# Patient Record
Sex: Male | Born: 1940
Health system: Southern US, Community
[De-identification: ages and names within clinical notes are randomized; demographics above are authoritative.]

## PROBLEM LIST (undated history)

## (undated) DIAGNOSIS — I10 Essential (primary) hypertension: Secondary | ICD-10-CM

## (undated) DIAGNOSIS — I4891 Unspecified atrial fibrillation: Secondary | ICD-10-CM

## (undated) DIAGNOSIS — I509 Heart failure, unspecified: Secondary | ICD-10-CM

## (undated) DIAGNOSIS — E785 Hyperlipidemia, unspecified: Secondary | ICD-10-CM

## (undated) DIAGNOSIS — N183 Chronic kidney disease, stage 3 unspecified: Secondary | ICD-10-CM

## (undated) DIAGNOSIS — I428 Other cardiomyopathies: Secondary | ICD-10-CM

## (undated) DIAGNOSIS — E119 Type 2 diabetes mellitus without complications: Secondary | ICD-10-CM

## (undated) DIAGNOSIS — N401 Enlarged prostate with lower urinary tract symptoms: Secondary | ICD-10-CM

## (undated) DIAGNOSIS — N138 Other obstructive and reflux uropathy: Secondary | ICD-10-CM

## (undated) DIAGNOSIS — E7800 Pure hypercholesterolemia, unspecified: Secondary | ICD-10-CM

## (undated) DIAGNOSIS — Z87442 Personal history of urinary calculi: Secondary | ICD-10-CM

## (undated) HISTORY — PX: CATARACT EXTRACTION W/ INTRAOCULAR LENS IMPLANT: SHX1309

## (undated) HISTORY — PX: HERNIA REPAIR: SHX51

## (undated) HISTORY — PX: CHOLECYSTECTOMY: SHX55

---

## 1998-07-16 ENCOUNTER — Ambulatory Visit (HOSPITAL_BASED_OUTPATIENT_CLINIC_OR_DEPARTMENT_OTHER): Admission: RE | Admit: 1998-07-16 | Discharge: 1998-07-16 | Payer: Self-pay | Admitting: Orthopedic Surgery

## 2003-10-05 ENCOUNTER — Emergency Department (HOSPITAL_COMMUNITY): Admission: EM | Admit: 2003-10-05 | Discharge: 2003-10-05 | Payer: Self-pay | Admitting: Emergency Medicine

## 2005-05-17 ENCOUNTER — Emergency Department (HOSPITAL_COMMUNITY): Admission: EM | Admit: 2005-05-17 | Discharge: 2005-05-17 | Payer: Self-pay | Admitting: Emergency Medicine

## 2005-08-20 ENCOUNTER — Ambulatory Visit (HOSPITAL_COMMUNITY): Admission: RE | Admit: 2005-08-20 | Discharge: 2005-08-20 | Payer: Self-pay | Admitting: Urology

## 2005-08-20 ENCOUNTER — Ambulatory Visit: Payer: Self-pay | Admitting: Internal Medicine

## 2005-10-26 ENCOUNTER — Ambulatory Visit (HOSPITAL_COMMUNITY): Admission: RE | Admit: 2005-10-26 | Discharge: 2005-10-26 | Payer: Self-pay | Admitting: General Surgery

## 2009-08-26 ENCOUNTER — Ambulatory Visit (HOSPITAL_COMMUNITY): Admission: RE | Admit: 2009-08-26 | Discharge: 2009-08-26 | Payer: Self-pay | Admitting: Internal Medicine

## 2010-08-27 ENCOUNTER — Encounter: Payer: Self-pay | Admitting: Internal Medicine

## 2010-09-02 NOTE — Letter (Signed)
Summary: TRIAGE ORDER  TRIAGE ORDER   Imported By: Sofie Rower 08/27/2010 13:37:18  _____________________________________________________________________  External Attachment:    Type:   Image     Comment:   External Document

## 2010-09-04 ENCOUNTER — Ambulatory Visit (HOSPITAL_COMMUNITY)
Admission: RE | Admit: 2010-09-04 | Discharge: 2010-09-04 | Disposition: A | Discharge status: Home / Self Care | Payer: Medicare Other | Source: Ambulatory Visit | Attending: Internal Medicine | Admitting: Internal Medicine

## 2010-09-04 ENCOUNTER — Other Ambulatory Visit: Payer: Self-pay | Admitting: Internal Medicine

## 2010-09-04 ENCOUNTER — Encounter: Payer: Medicare Other | Admitting: Internal Medicine

## 2010-09-04 DIAGNOSIS — Z09 Encounter for follow-up examination after completed treatment for conditions other than malignant neoplasm: Secondary | ICD-10-CM

## 2010-09-04 DIAGNOSIS — Z79899 Other long term (current) drug therapy: Secondary | ICD-10-CM | POA: Insufficient documentation

## 2010-09-04 DIAGNOSIS — Z860100 Personal history of colon polyps, unspecified: Secondary | ICD-10-CM | POA: Insufficient documentation

## 2010-09-04 DIAGNOSIS — D126 Benign neoplasm of colon, unspecified: Secondary | ICD-10-CM | POA: Insufficient documentation

## 2010-09-04 DIAGNOSIS — I10 Essential (primary) hypertension: Secondary | ICD-10-CM | POA: Insufficient documentation

## 2010-09-04 DIAGNOSIS — K5730 Diverticulosis of large intestine without perforation or abscess without bleeding: Secondary | ICD-10-CM

## 2010-09-04 HISTORY — PX: COLONOSCOPY: SHX174

## 2010-09-09 NOTE — Op Note (Signed)
  NAME:  JAGAN, VODA                ACCOUNT NO.:  [id number]  MEDICAL RECORD NO.:  [id number]           PATIENT TYPE:  O  LOCATION:  DAYP                          FACILITY:  [facility]  PHYSICIAN:  R. Garfield Cornea, M.D. DATE OF BIRTH:  [birth year]  DATE OF PROCEDURE:  09/04/2010 DATE OF DISCHARGE:                              OPERATIVE REPORT   COLONOSCOPY AND BIOPSY  INDICATIONS FOR PROCEDURE:  The patient is a pleasant 70 year old gentleman with history of colonic polyps.  Last colonoscopy is in March 2007.  At that time, he was found have pan-colonic diverticula only.  He has no lower GI tract symptoms currently.  Colonoscopy is now being done as surveillance maneuver.  Risks, benefits, limitations, alternatives, imponderables have been discussed, questions answered.  Please see documentation in the medical record.  PROCEDURE NOTE:  O2 saturation, blood pressure, pulse, and respirations were monitored throughout the entirety of procedure.  CONSCIOUS SEDATION:  Versed 3 mg IV, Demerol 75 mg IV in divided doses.  INSTRUMENT:  Pentax video chip system.  FINDINGS:  Examination of the digital rectal exam revealed no abnormalities.  Endoscopic findings:  Prep was adequate.  Colon: Colonic mucosa was surveyed from the rectosigmoid junction through the left transverse right colon to the appendiceal orifice, ileocecal valve/cecum.  These structures were well seen and photographed for the record.  From this level, scope was slowly and cautiously withdrawn. All previously mentioned mucosal surfaces were again seen.  The patient was noted to have single diminutive polyp at the base of the cecum which was cold biopsied/removed and also pan-colonic diverticula and remainder colon mucosa appeared normal.  Scope was pulled down the rectum where a thorough examination of the rectal mucosa including retroflex view of the anal verge demonstrated no abnormalities.  The patient tolerated  the procedure well.  Cecal withdrawal time 11 minutes.  IMPRESSION: 1. Normal rectum. 2. Pan colonic diverticula. 3. Cecal polyp status post cold biopsy removal.  RECOMMENDATIONS: 1. Diverticulosis polyp literature provided to Mr. Lauderman. 2. Follow up on path. 3. Further recommendations to follow.     Bridgette Habermann, M.D.     RMR/MEDQ  D:  09/04/2010  T:  09/04/2010  Job:  [id number]  cc:   Paula Compton. Willey Blade, MD Fax: [phone]  Electronically Signed by Jannette Spanner M.D. on 09/09/2010 11:23:53 AM

## 2010-10-31 NOTE — Op Note (Signed)
Chris Guerrero, Chris Guerrero                ACCOUNT NO.:  [id number]   MEDICAL RECORD NO.:  [id number]          PATIENT TYPE:  AMB   LOCATION:  DAY                           FACILITY:  [facility]   PHYSICIAN:  Jamesetta So, M.D.  DATE OF BIRTH:  June 21, 1940   DATE OF PROCEDURE:  10/26/2005  DATE OF DISCHARGE:                                 OPERATIVE REPORT   PREOPERATIVE DIAGNOSIS:  Incisional hernia.   POSTOPERATIVE DIAGNOSIS:  Incisional hernia.   PROCEDURE:  Incisional herniorrhaphy with mesh.   SURGEON:  Dr. Aviva Signs   ANESTHESIA:  General endotracheal.   INDICATIONS:  The patient is a 70 year old white male status post abdominal  surgery in the remote past who presents with an umbilical and periumbilical  hernia.  This is incisional in nature.  The patient now comes to the  operating room for an incisional herniorrhaphy with mesh.  The risks and  benefits of the procedure including bleeding, infection, and recurrence of  the hernia were fully explained to the patient, who gave informed consent.   PROCEDURE NOTE:  The patient was placed in supine position.  After induction  of general endotracheal anesthesia, the abdomen was prepped and draped using  the usual sterile technique with Betadine.  Surgical site confirmation was  performed.   An incision was made through the previous midline incision around the  umbilicus.  This was taken down to the fascia.  The patient was noted to  have small metal sutures in place which were removed.  The hernia involved  the umbilicus and the incision.  The hernia defect was found and omentum was  present.  This was reduced without difficulty.  The defect measured  approximately 3-4 cm its greatest diameter.  A medium-size polypropylene  mesh plug was then placed into this region and secured circumferentially to  the fascia using 2-0 Novofil interrupted sutures.  The fascia was then  reapproximated longitudinally using 0 Prolene interrupted  sutures.  The base  of the umbilicus was secured back to the fascia using a 2-0 Vicryl  interrupted suture.  The subcutaneous layer was reapproximated using a 2-0  Vicryl interrupted suture.  The skin was closed using staples.  0.5%  Sensorcaine was instilled in the surrounding wound.  Betadine ointment and  dry sterile dressing were applied.   All tape and needle counts were correct at the end of the procedure.  The  patient was extubated in the operating room and went back to recovery room  awake in stable condition.   COMPLICATIONS:  None.   SPECIMEN:  None.   BLOOD LOSS:  Minimal.      Jamesetta So, M.D.  Electronically Signed     MAJ/MEDQ  D:  10/26/2005  T:  10/26/2005  Job:  [id number]   cc:   Paula Compton. Willey Blade, MD  Fax: [phone]

## 2010-10-31 NOTE — H&P (Signed)
Chris Guerrero, Chris Guerrero                ACCOUNT NO.:  [id number]   MEDICAL RECORD NO.:  [id number]          PATIENT TYPE:  AMB   LOCATION:                                FACILITY:  [facility]   PHYSICIAN:  Jamesetta So, M.D.  DATE OF BIRTH:  01/24/1941   DATE OF ADMISSION:  10/26/2005  DATE OF DISCHARGE:  LH                                HISTORY & PHYSICAL   CHIEF COMPLAINT:  Incisional hernia.   HISTORY OF PRESENT ILLNESS:  The patient is a 70 year old white male who was  referred for evaluation and treatment of incisional hernia.  It has been  present for some time but recently has increased in size and is causing him  discomfort.  It is in the periumbilical region.  No nausea or vomiting have  been noted.   PAST MEDICAL HISTORY:  1.  Hypertension.  2.  Indigestion.   PAST SURGICAL HISTORY:  1.  Abdominal surgery.  2.  Thumb surgery.   CURRENT MEDICATIONS:  1.  Doxazosin 4 mg p.o. daily.  2.  Lisinopril 40 mg p.o. daily.  3.  Ranitidine 150 mg p.o. daily.  4.  Trazodone 50 mg p.o. daily.  5.  Gemfibrozil 600 mg p.o. daily.   ALLERGIES:  No known drug allergies.   REVIEW OF SYSTEMS:  Noncontributory.   PHYSICAL EXAMINATION:  GENERAL:  The patient is a well-developed, well-  nourished white male in no acute distress.  LUNGS:  Clear to auscultation with equal breath sounds bilaterally.  CARDIAC:  Regular rate and rhythm without S3, S4 or murmurs.  ABDOMEN:  Soft, nontender, nondistended.  An umbilical and periumbilical  hernia is present adjacent to a surgical scar.  It is reducible.   IMPRESSION:  Incisional hernia.   PLAN:  The patient is scheduled for an incisional herniorrhaphy with  possible mesh on Oct 26, 2005.  The risks and benefits of the procedure  including bleeding, infection, recurrence of the hernia, were fully  explained to the patient, who gave informed consent.      Jamesetta So, M.D.  Electronically Signed     MAJ/MEDQ  D:  10/16/2005  T:   10/16/2005  Job:  [id number]   cc:   [facility]  Fax: [place] Willey Blade, MD  Fax: [phone]

## 2010-10-31 NOTE — Op Note (Signed)
NAME:  Chris Guerrero, Chris Guerrero                ACCOUNT NO.:  [id number]   MEDICAL RECORD NO.:  [id number]          PATIENT TYPE:  AMB   LOCATION:  DAY                           FACILITY:  [facility]   PHYSICIAN:  R. Garfield Cornea, M.D. DATE OF BIRTH:  Jun 08, 1941   DATE OF PROCEDURE:  08/20/2005  DATE OF DISCHARGE:                                 OPERATIVE REPORT   PROCEDURE:  Surveillance colonoscopy.   INDICATIONS FOR PROCEDURE:  The patient is a 70 year old gentleman with a  history of colonic adenomas removed back in the 1990s. His last colonoscopy  was in 2002 which showed only diverticulosis. He is devoid of any lower GI  tract symptoms. He is here for surveillance colonoscopy. This approach has  been discussed with the patient at length. Potential risks, benefits, and  alternatives have been reviewed and questions answered. He is agreeable.  Please see documentation in the medical record.   PROCEDURE NOTE:  O2 saturation, blood pressure, pulse, and respirations were  monitored throughout the entire procedure. Conscious sedation with Versed 3  mg IV and Demerol 50 mg IV in divided doses.   INSTRUMENT:  Olympus video chip system.   FINDINGS:  Digital rectal exam revealed no abnormalities.   ENDOSCOPIC FINDINGS:  Prep was excellent.   Rectum:  Examination of the rectal mucosa including retroflexed view of the  anal verge revealed no abnormalities.   Colon:  Colonic mucosa was surveyed from the rectosigmoid junction through  the left, transverse, and right colon to the area of the appendiceal  orifice, ileocecal valve, and cecum. These structures were well seen and  photographed for the record. From this level, the scope was slowly  withdrawn, and all previously mentioned mucosal surfaces were again seen.  The patient had a few scattered pan colonic diverticula. The remainder of  the colonic mucosa appeared normal. The patient tolerated the procedure well  and was reactive to endoscopy.   IMPRESSION:  1.  Normal rectum.  2.  Few scattered pan colonic diverticula. The remainder of the colonic      mucosa appeared normal.   RECOMMENDATIONS:  1.  Diverticulosis literature provided to Mr. Jaggers.  2.  Repeat surveillance colonoscopy in five years.      Bridgette Habermann, M.D.  Electronically Signed     RMR/MEDQ  D:  08/20/2005  T:  08/21/2005  Job:  [id number]   cc:   Paula Compton. Willey Blade, MD  Fax: [phone]

## 2011-08-12 ENCOUNTER — Encounter (HOSPITAL_COMMUNITY): Payer: Self-pay | Admitting: *Deleted

## 2011-08-12 ENCOUNTER — Emergency Department (HOSPITAL_COMMUNITY)
Admission: EM | Admit: 2011-08-12 | Discharge: 2011-08-13 | Disposition: A | Discharge status: Home / Self Care | Payer: Medicare Other | Attending: Emergency Medicine | Admitting: Emergency Medicine

## 2011-08-12 DIAGNOSIS — I10 Essential (primary) hypertension: Secondary | ICD-10-CM | POA: Insufficient documentation

## 2011-08-12 DIAGNOSIS — R339 Retention of urine, unspecified: Secondary | ICD-10-CM

## 2011-08-12 DIAGNOSIS — Z79899 Other long term (current) drug therapy: Secondary | ICD-10-CM | POA: Insufficient documentation

## 2011-08-12 DIAGNOSIS — R300 Dysuria: Secondary | ICD-10-CM | POA: Insufficient documentation

## 2011-08-12 DIAGNOSIS — E785 Hyperlipidemia, unspecified: Secondary | ICD-10-CM | POA: Insufficient documentation

## 2011-08-12 HISTORY — DX: Hyperlipidemia, unspecified: E78.5

## 2011-08-12 HISTORY — DX: Essential (primary) hypertension: I10

## 2011-08-12 NOTE — ED Notes (Signed)
Pt reports he has been unable to urinate for the past three hours

## 2011-08-13 LAB — URINE CULTURE: Culture  Setup Time: 201302280045

## 2011-08-13 LAB — URINALYSIS, MICROSCOPIC ONLY
Glucose, UA: NEGATIVE mg/dL
Ketones, ur: NEGATIVE mg/dL
Specific Gravity, Urine: <1.005 — ABNORMAL LOW (ref 1.005–1.030)
pH: 5.5 (ref 5.0–8.0)

## 2011-08-13 NOTE — ED Notes (Signed)
Leg bag placed on right leg. Patient tolerated well. Denies any needs. Getting dressed at this time.

## 2011-08-13 NOTE — ED Provider Notes (Signed)
History     CSN: [id number]  Arrival date & time 08/12/11  2334   First MD Initiated Contact with Patient 08/13/11 0002      Chief Complaint  Patient presents with  . Dysuria     The history is provided by the patient.  the patient reports approximately 3 and half hours of feeling like he needs a urinate but unable to.  Reports lower abdominal pressure.  He denies nausea and vomiting.  He denies fevers and chills.  He was otherwise in normal health prior to this.  He has had an episode of urinary retention before in the past.  He recently saw the urologist for followup and reports he had several "tests".  He denies dysuria and urinary frequency.  He denies flank pain.  He is otherwise without complaints.  But is on my valuation the patient had had a Foley catheter placed already and he drained out approximately 1000 cc of urine and reported that he felt much better at this time.  He reports he is ready to be discharged home.  Nothing worsens the symptoms.  Nothing improves his symptoms.  His symptoms were constant and are now resolved  Past Medical History  Diagnosis Date  . Hypertension   . Hyperlipemia     Past Surgical History  Procedure Date  . Cholecystectomy   . Hernia repair     No family history on file.  History  Substance Use Topics  . Smoking status: Never Smoker   . Smokeless tobacco: Not on file  . Alcohol Use: Yes      Review of Systems  Genitourinary: Positive for dysuria.  All other systems reviewed and are negative.    Allergies  Review of patient's allergies indicates no known allergies.  Home Medications   Current Outpatient Rx  Name Route Sig Dispense Refill  . DOXAZOSIN MESYLATE 4 MG PO TABS Oral Take 4 mg by mouth at bedtime.    [name] GEMFIBROZIL 600 MG PO TABS Oral Take 600 mg by mouth 2 (two) times daily before a meal.    . LISINOPRIL 40 MG PO TABS Oral Take 40 mg by mouth daily.    [name] RANITIDINE HCL 150 MG PO CAPS Oral Take 150 mg by mouth  daily.    . TESTOSTERONE CYPIONATE 100 MG/ML IM OIL Intramuscular Inject 200 mg into the muscle every 28 (twenty-eight) days. For IM use only    . TRAZODONE HCL 50 MG PO TABS Oral Take 50 mg by mouth at bedtime.      BP 145/83  Pulse 114  Temp(Src) 97.6 F (36.4 C) (Oral)  Resp 20  Ht 5\' 10"  (1.778 m)  Wt 200 lb (90.719 kg)  BMI 28.70 kg/m2  SpO2 96%  Physical Exam  Nursing note and vitals reviewed. Constitutional: He is oriented to person, place, and time. He appears well-developed and well-nourished.  HENT:  Head: Normocephalic and atraumatic.  Eyes: EOM are normal.  Neck: Normal range of motion.  Cardiovascular: Normal rate.   Pulmonary/Chest: Effort normal.  Abdominal: Soft. He exhibits no distension. There is no tenderness.  Genitourinary: Penis normal.  Musculoskeletal: Normal range of motion.  Neurological: He is alert and oriented to person, place, and time.  Skin: Skin is warm and dry.  Psychiatric: He has a normal mood and affect. Judgment normal.    ED Course  Procedures (including critical care time)  Labs Reviewed  URINALYSIS, WITH MICROSCOPIC - Abnormal; Notable for the following:  Color, Urine STRAW (*)    Specific Gravity, Urine <1.005 (*)    Hgb urine dipstick MODERATE (*)    All other components within normal limits  URINE CULTURE   No results found.   1. Urinary retention       MDM  Acute urinary retention resolved with Foley catheter.  Home with Foley catheter.  Urology followup.  Urinalysis is without significant abnormality.  Urine culture sent        Hoy Morn, MD 08/13/11 0100

## 2011-08-13 NOTE — ED Notes (Signed)
MD at bedside to evaluate.

## 2011-08-13 NOTE — ED Notes (Signed)
MD at bedside to discuss plan of care

## 2011-08-13 NOTE — ED Notes (Signed)
Into room to see patient. Resting sitting up in bed. 1000 cc urine in foley bag. States he is feeling a lot better and going home. Call bell within reach. In no distress. Denies needs.

## 2011-08-13 NOTE — Discharge Instructions (Signed)
Acute Urinary Retention, Male You have been seen by a caregiver today because of your inability to urinate (pass your water). This is a common problem in elderly males. As men age their prostates become larger and block the flow of urine from the bladder. This is usually a problem that has come on gradually. It is often first noticed by having to get up at night to urinate. This is because as the prostate enlarges it is more difficult to empty the bladder completely. Treatment may involve a one time catheterization to empty the bladder. This is putting in a tube to drain your urine. Then you and your personal caregiver can decide at your earliest convenience how to handle this problem in the future. It may also be a problem that may not recur for years. Sometimes this problem can be caused by medications. In this case, all that is often necessary is to discontinue the offending agent. If you are to leave the foley catheter (a long, narrow, hollow tube) in and go home with a drainage system, you will need to discuss the best course of action with your caregiver. While the catheter is in, maintain a good intake of fluids. Keep the drainage bag emptied and lower than your catheter. This is so contaminated (infected) urine will not be flowing back into your bladder. This could lead to a urinary tract infection. Only take over-the-counter or prescription medicines for pain, discomfort, or fever as directed by your caregiver.  SEEK IMMEDIATE MEDICAL CARE IF:  You develop chills, fever, or show signs of generalized illness that occurs prior to seeing your caregiver. Document Released: 09/07/2000 Document Revised: 02/11/2011 Document Reviewed: 05/23/2008 [facility] Patient Information 2012 [place].

## 2011-08-13 NOTE — ED Notes (Signed)
Into room to assess patient. States he has not been able to pass urine for the past 3 hours. Noted swelling in bladder area. Tender on palpation. Denies abdominal pain. Call bell within reach. Denies needs.

## 2012-07-14 ENCOUNTER — Ambulatory Visit (INDEPENDENT_AMBULATORY_CARE_PROVIDER_SITE_OTHER): Payer: Medicare Other | Admitting: Otolaryngology

## 2013-05-21 ENCOUNTER — Encounter (HOSPITAL_COMMUNITY): Payer: Self-pay | Admitting: Emergency Medicine

## 2013-05-21 ENCOUNTER — Emergency Department (HOSPITAL_COMMUNITY)
Admission: EM | Admit: 2013-05-21 | Discharge: 2013-05-21 | Disposition: A | Discharge status: Home / Self Care | Payer: Medicare Other | Attending: Emergency Medicine | Admitting: Emergency Medicine

## 2013-05-21 DIAGNOSIS — E785 Hyperlipidemia, unspecified: Secondary | ICD-10-CM | POA: Insufficient documentation

## 2013-05-21 DIAGNOSIS — M549 Dorsalgia, unspecified: Secondary | ICD-10-CM | POA: Insufficient documentation

## 2013-05-21 DIAGNOSIS — R300 Dysuria: Secondary | ICD-10-CM | POA: Insufficient documentation

## 2013-05-21 DIAGNOSIS — Z79899 Other long term (current) drug therapy: Secondary | ICD-10-CM | POA: Insufficient documentation

## 2013-05-21 DIAGNOSIS — R109 Unspecified abdominal pain: Secondary | ICD-10-CM | POA: Insufficient documentation

## 2013-05-21 DIAGNOSIS — R339 Retention of urine, unspecified: Secondary | ICD-10-CM | POA: Insufficient documentation

## 2013-05-21 DIAGNOSIS — I10 Essential (primary) hypertension: Secondary | ICD-10-CM | POA: Insufficient documentation

## 2013-05-21 LAB — URINALYSIS, ROUTINE W REFLEX MICROSCOPIC
Bilirubin Urine: NEGATIVE
Ketones, ur: NEGATIVE mg/dL
Leukocytes, UA: NEGATIVE
Specific Gravity, Urine: <1.005 — ABNORMAL LOW (ref 1.005–1.030)
Urobilinogen, UA: 0.2 mg/dL (ref 0.0–1.0)

## 2013-05-21 LAB — URINE MICROSCOPIC-ADD ON

## 2013-05-21 NOTE — ED Notes (Signed)
Pink tinge urine noted in drainage tubing - advised patient to drink fluids to assist with flushing urine from bladder - should clear, if any concerns, return or call his MD.

## 2013-05-21 NOTE — ED Notes (Signed)
Catheter has drained ~ 1000 + ml   Into foley bag.  Patient is feeling much relief

## 2013-05-21 NOTE — ED Notes (Signed)
Onset today of increasing difficulty trying to void- now uncomfortable.  Has experienced similar difficulty in the past.

## 2013-05-21 NOTE — ED Provider Notes (Signed)
CSN: [id number]     Arrival date & time 05/21/13  2007 History  This chart was scribed for Lebron Quam, MD by Eston Mould, ED Scribe. This patient was seen in room APA01/APA01 and the patient's care was started at 8:35 PM.   Chief Complaint  Patient presents with  . Urinary Retention  . Back Pain   The history is provided by the patient. No language interpreter was used.   HPI Comments: Chris Guerrero is a 72 y.o. male who presents to the Emergency Department complaining of urinary retention and back pain that began about 6 hours ago . Pt states he is feeling lower abd pain. He states he has had previous urinary retention that lasted 1 week. He states he had a catheter placed to remove the urine. He states he was seen with a urologist that removed his catheter. Pt denies taking any new medications. Pt denies having any surgeries. Pt denies n/v/d.  Past Medical History  Diagnosis Date  . Hypertension   . Hyperlipemia    Past Surgical History  Procedure Laterality Date  . Cholecystectomy    . Hernia repair     History reviewed. No pertinent family history. History  Substance Use Topics  . Smoking status: Never Smoker   . Smokeless tobacco: Not on file  . Alcohol Use: Yes    Review of Systems  Constitutional: Negative for fever, chills, diaphoresis, appetite change and fatigue.  HENT: Negative for sore throat and trouble swallowing.   Eyes: Negative for visual disturbance.  Respiratory: Negative for cough, chest tightness, shortness of breath and wheezing.   Cardiovascular: Negative for chest pain.  Gastrointestinal: Negative for nausea, vomiting, abdominal pain, diarrhea and abdominal distention.  Endocrine: Negative for polydipsia, polyphagia and polyuria.  Genitourinary: Positive for difficulty urinating.  Musculoskeletal: Positive for back pain. Negative for gait problem.  Skin: Negative for color change, pallor and rash.  Neurological: Negative for dizziness,  syncope, light-headedness and headaches.    Allergies  Review of patient's allergies indicates no known allergies.  Home Medications   Current Outpatient Rx  Name  Route  Sig  Dispense  Refill  . doxazosin (CARDURA) 4 MG tablet   Oral   Take 4 mg by mouth at bedtime.         [name] gemfibrozil (LOPID) 600 MG tablet   Oral   Take 600 mg by mouth 2 (two) times daily before a meal.         . lisinopril (PRINIVIL,ZESTRIL) 40 MG tablet   Oral   Take 40 mg by mouth daily.         . ranitidine (ZANTAC) 150 MG capsule   Oral   Take 150 mg by mouth daily.         [name] testosterone cypionate (DEPOTESTOTERONE CYPIONATE) 100 MG/ML injection   Intramuscular   Inject 200 mg into the muscle every 28 (twenty-eight) days. For IM use only         . traZODone (DESYREL) 50 MG tablet   Oral   Take 50 mg by mouth at bedtime.          Triage Vitals:BP 133/75  Pulse 91  Temp(Src) 98.1 F (36.7 C)  Resp 20  Ht 5\' 9"  (1.753 m)  Wt 208 lb (94.348 kg)  BMI 30.70 kg/m2  SpO2 94%  Physical Exam  Nursing note and vitals reviewed. Constitutional: He is oriented to person, place, and time. He appears well-developed and well-nourished.  HENT:  Head:  Normocephalic.  Right Ear: External ear normal.  Left Ear: External ear normal.  Nose: Nose normal.  Mouth/Throat: Oropharynx is clear and moist.  Eyes: EOM are normal. Pupils are equal, round, and reactive to light. Right eye exhibits no discharge. Left eye exhibits no discharge.  Neck: Normal range of motion. Neck supple. No tracheal deviation present.  No nuchal rigidity no meningeal signs  Cardiovascular: Normal rate and regular rhythm.   Pulmonary/Chest: Effort normal and breath sounds normal. No stridor. No respiratory distress. He has no wheezes. He has no rales.  Abdominal: Soft. He exhibits no distension and no mass. There is no tenderness. There is no rebound and no guarding.  Tenderness and fullness to his lower abd.    Genitourinary:  Palpable distention of urinary bladder. Bedside ultrasound is within 2 finger breaths of the umbilicus.  Musculoskeletal: Normal range of motion. He exhibits no edema and no tenderness.  Neurological: He is alert and oriented to person, place, and time. He has normal reflexes. No cranial nerve deficit. Coordination normal.  Skin: Skin is warm. No rash noted. He is not diaphoretic. No erythema. No pallor.  No pettechia no purpura    ED Course  Procedures  DIAGNOSTIC STUDIES: Oxygen Saturation is 94% on RA, normal by my interpretation.    COORDINATION OF CARE: 8:40 PM-Discussed treatment plan which includes inserting foley catheter. Pt agreed to plan.   Labs Review Labs Reviewed - No data to display Imaging Review No results found.  EKG Interpretation   None       MDM   1. Urinary retention     Plan will be Foley catheter and urology followup. He is already taking alpha blockers  I personally performed the services described in this documentation, which was scribed in my presence. The recorded information has been reviewed and is accurate.    Lebron Quam, MD 05/21/13 2101

## 2013-05-21 NOTE — ED Notes (Signed)
Detailed instructions, related to follow-up in 4-5 days with Dr Karsten Ro.  Reviewed how to connect and disconnect leg bag and reinforced importance of keeping bag below the bladder to allow proper drainage, keeping bag off the floor.

## 2013-06-04 ENCOUNTER — Encounter (HOSPITAL_COMMUNITY): Payer: Self-pay | Admitting: Emergency Medicine

## 2013-06-04 ENCOUNTER — Emergency Department (HOSPITAL_COMMUNITY)
Admission: EM | Admit: 2013-06-04 | Discharge: 2013-06-04 | Disposition: A | Discharge status: Home / Self Care | Payer: Medicare Other | Attending: Emergency Medicine | Admitting: Emergency Medicine

## 2013-06-04 DIAGNOSIS — Z79899 Other long term (current) drug therapy: Secondary | ICD-10-CM | POA: Insufficient documentation

## 2013-06-04 DIAGNOSIS — I10 Essential (primary) hypertension: Secondary | ICD-10-CM | POA: Insufficient documentation

## 2013-06-04 DIAGNOSIS — R109 Unspecified abdominal pain: Secondary | ICD-10-CM | POA: Insufficient documentation

## 2013-06-04 DIAGNOSIS — R339 Retention of urine, unspecified: Secondary | ICD-10-CM | POA: Insufficient documentation

## 2013-06-04 DIAGNOSIS — E785 Hyperlipidemia, unspecified: Secondary | ICD-10-CM | POA: Insufficient documentation

## 2013-06-04 LAB — URINE MICROSCOPIC-ADD ON

## 2013-06-04 LAB — URINALYSIS, ROUTINE W REFLEX MICROSCOPIC
Bilirubin Urine: NEGATIVE
Ketones, ur: NEGATIVE mg/dL
Nitrite: NEGATIVE
Protein, ur: NEGATIVE mg/dL

## 2013-06-04 NOTE — ED Notes (Signed)
Urinary retention since 1 or 2 pm.

## 2013-06-04 NOTE — ED Provider Notes (Signed)
CSN: [id number]     Arrival date & time 06/04/13  1944 History  This chart was scribed for Chris Kung, MD by Roxan Diesel, ED scribe.  This patient was seen in room APA05/APA05 and the patient's care was started at 7:59 PM.   Chief Complaint  Patient presents with  . Urinary Retention    Patient is a 72 y.o. male presenting with abdominal pain. The history is provided by the patient. No language interpreter was used.  Abdominal Pain Timing:  Constant Progression:  Worsening Chronicity:  Recurrent Context comment:  Urinary retention Associated symptoms: no chest pain, no chills, no cough, no dysuria, no fever, no shortness of breath and no sore throat     HPI Comments: Chris Guerrero is a 72 y.o. male who presents to the Emergency Department complaining of recurrent urinary retention.  Pt has prior h/o recurrent urinary retention and was seen here on 12/7 for the same and had a foley placed.  This provided relief initially and the catheter was removed on 12/18.  He was able to urinate for 2 days but last urinated today at around 2 or 3 PM and has been unable to void since then and has developed constant, worsening associated abdominal pain.  He denies nausea, vomiting, fever, or any other associated symptoms.  He has a f/u appointment scheduled with his urologist in several months.    Past Medical History  Diagnosis Date  . Hypertension   . Hyperlipemia     Past Surgical History  Procedure Laterality Date  . Cholecystectomy    . Hernia repair      History reviewed. No pertinent family history.   History  Substance Use Topics  . Smoking status: Never Smoker   . Smokeless tobacco: Not on file  . Alcohol Use: Yes     Review of Systems  Constitutional: Negative for fever and chills.  HENT: Negative for congestion, rhinorrhea and sore throat.   Eyes: Negative for visual disturbance.  Respiratory: Negative for cough and shortness of breath.   Cardiovascular:  Negative for chest pain and leg swelling.  Gastrointestinal: Positive for abdominal pain.  Genitourinary: Positive for difficulty urinating. Negative for dysuria.  Musculoskeletal: Negative for back pain and neck pain.  Skin: Negative for rash.  Neurological: Negative for headaches.  Hematological: Does not bruise/bleed easily.  Psychiatric/Behavioral: Negative for confusion.  All other systems reviewed and are negative.     Allergies  Review of patient's allergies indicates no known allergies.  Home Medications   Current Outpatient Rx  Name  Route  Sig  Dispense  Refill  . doxazosin (CARDURA) 8 MG tablet   Oral   Take 8 mg by mouth at bedtime.          [name] gemfibrozil (LOPID) 600 MG tablet   Oral   Take 600 mg by mouth 2 (two) times daily before a meal.         . lisinopril (PRINIVIL,ZESTRIL) 40 MG tablet   Oral   Take 40 mg by mouth daily.         . ranitidine (ZANTAC) 150 MG capsule   Oral   Take 150 mg by mouth daily.         . sildenafil (VIAGRA) 100 MG tablet   Oral   Take 100 mg by mouth daily as needed for erectile dysfunction.         [name] testosterone cypionate (DEPOTESTOTERONE CYPIONATE) 100 MG/ML injection   Intramuscular   Inject  200 mg into the muscle every 28 (twenty-eight) days. For IM use only         . traZODone (DESYREL) 50 MG tablet   Oral   Take 50 mg by mouth at bedtime.          BP 136/79  Pulse 130  Temp(Src) 97.3 F (36.3 C)  Resp 20  Ht 5\' 9"  (1.753 m)  Wt 202 lb (91.627 kg)  BMI 29.82 kg/m2  SpO2 98%  Physical Exam  Nursing note and vitals reviewed. Constitutional: He is oriented to person, place, and time. He appears well-developed and well-nourished. No distress.  HENT:  Head: Normocephalic and atraumatic.  Eyes: EOM are normal.  Neck: Neck supple. No tracheal deviation present.  Cardiovascular: Normal rate, regular rhythm and normal heart sounds.   No murmur heard. Pulmonary/Chest: Effort normal and breath  sounds normal. No respiratory distress. He has no wheezes. He has no rales.  Abdominal: Bowel sounds are normal. He exhibits distension.  Bladder is distended right to the umbilicus  Musculoskeletal: Normal range of motion. He exhibits no edema.  Neurological: He is alert and oriented to person, place, and time. No cranial nerve deficit.  Skin: Skin is warm and dry.  Psychiatric: He has a normal mood and affect. His behavior is normal.    ED Course  Procedures (including critical care time)  DIAGNOSTIC STUDIES: Oxygen Saturation is 98% on room air, normal by my interpretation.    COORDINATION OF CARE: 8:08 PM-Discussed treatment plan which includes catheter placement and UA with pt at bedside and pt agreed to plan.    Labs Review Labs Reviewed  URINALYSIS, ROUTINE W REFLEX MICROSCOPIC - Abnormal; Notable for the following:    Color, Urine STRAW (*)    Specific Gravity, Urine <1.005 (*)    Hgb urine dipstick LARGE (*)    All other components within normal limits  URINE MICROSCOPIC-ADD ON - Abnormal; Notable for the following:    Squamous Epithelial / LPF FEW (*)    Bacteria, UA MANY (*)    All other components within normal limits   Results for orders placed during the hospital encounter of 06/04/13  URINALYSIS, ROUTINE W REFLEX MICROSCOPIC      Result Value Range   Color, Urine STRAW (*) YELLOW   APPearance CLEAR  CLEAR   Specific Gravity, Urine <1.005 (*) 1.005 - 1.030   pH 5.5  5.0 - 8.0   Glucose, UA NEGATIVE  NEGATIVE mg/dL   Hgb urine dipstick LARGE (*) NEGATIVE   Bilirubin Urine NEGATIVE  NEGATIVE   Ketones, ur NEGATIVE  NEGATIVE mg/dL   Protein, ur NEGATIVE  NEGATIVE mg/dL   Urobilinogen, UA 0.2  0.0 - 1.0 mg/dL   Nitrite NEGATIVE  NEGATIVE   Leukocytes, UA NEGATIVE  NEGATIVE  URINE MICROSCOPIC-ADD ON      Result Value Range   Squamous Epithelial / LPF FEW (*) RARE   WBC, UA 0-2  <3 WBC/hpf   Bacteria, UA MANY (*) RARE     Imaging Review No results  found.  EKG Interpretation   None       MDM   1. Urinary retention    Foley catheter placed. Patient had 1200 cc of urine out. Patient feels much better much more comfortable tachycardia improved. Urinalysis without distinct evidence of urinary tract infection. Will switch to a leg bag and followup [facility] this week.    I personally performed the services described in this documentation, which was scribed in  my presence. The recorded information has been reviewed and is accurate.     Chris Kung, MD 06/04/13 2113

## 2013-06-04 NOTE — ED Notes (Signed)
Leg bag applied, drainage bag sent home

## 2013-06-04 NOTE — ED Notes (Signed)
Pt d/c home with foley catheter in place, tube patent of pink tinged urine, leg bag in place and pt verbalized understanding of leg bag use and use of drainage bag at night due to catheter before, foley catheter secured with tape and sent home with tape x 2

## 2013-06-04 NOTE — ED Notes (Signed)
Pt states foley catheter was removed on Thursday and without problems voiding til today around 1500

## 2014-07-26 DIAGNOSIS — H40013 Open angle with borderline findings, low risk, bilateral: Secondary | ICD-10-CM | POA: Diagnosis not present

## 2014-07-26 DIAGNOSIS — H2513 Age-related nuclear cataract, bilateral: Secondary | ICD-10-CM | POA: Diagnosis not present

## 2014-08-13 DIAGNOSIS — E785 Hyperlipidemia, unspecified: Secondary | ICD-10-CM | POA: Diagnosis not present

## 2014-08-13 DIAGNOSIS — E1129 Type 2 diabetes mellitus with other diabetic kidney complication: Secondary | ICD-10-CM | POA: Diagnosis not present

## 2014-08-13 DIAGNOSIS — I10 Essential (primary) hypertension: Secondary | ICD-10-CM | POA: Diagnosis not present

## 2014-12-05 DIAGNOSIS — E119 Type 2 diabetes mellitus without complications: Secondary | ICD-10-CM | POA: Diagnosis not present

## 2014-12-21 DIAGNOSIS — I10 Essential (primary) hypertension: Secondary | ICD-10-CM | POA: Diagnosis not present

## 2014-12-21 DIAGNOSIS — E119 Type 2 diabetes mellitus without complications: Secondary | ICD-10-CM | POA: Diagnosis not present

## 2015-01-31 DIAGNOSIS — H10413 Chronic giant papillary conjunctivitis, bilateral: Secondary | ICD-10-CM | POA: Diagnosis not present

## 2015-01-31 DIAGNOSIS — H25813 Combined forms of age-related cataract, bilateral: Secondary | ICD-10-CM | POA: Diagnosis not present

## 2015-03-19 DIAGNOSIS — Z23 Encounter for immunization: Secondary | ICD-10-CM | POA: Diagnosis not present

## 2015-04-26 DIAGNOSIS — E119 Type 2 diabetes mellitus without complications: Secondary | ICD-10-CM | POA: Diagnosis not present

## 2015-05-03 DIAGNOSIS — N400 Enlarged prostate without lower urinary tract symptoms: Secondary | ICD-10-CM | POA: Diagnosis not present

## 2015-05-03 DIAGNOSIS — Z6832 Body mass index (BMI) 32.0-32.9, adult: Secondary | ICD-10-CM | POA: Diagnosis not present

## 2015-05-03 DIAGNOSIS — E119 Type 2 diabetes mellitus without complications: Secondary | ICD-10-CM | POA: Diagnosis not present

## 2015-05-03 DIAGNOSIS — I10 Essential (primary) hypertension: Secondary | ICD-10-CM | POA: Diagnosis not present

## 2015-09-02 DIAGNOSIS — E119 Type 2 diabetes mellitus without complications: Secondary | ICD-10-CM | POA: Diagnosis not present

## 2015-09-09 ENCOUNTER — Encounter: Payer: Self-pay | Admitting: Internal Medicine

## 2015-09-10 DIAGNOSIS — E785 Hyperlipidemia, unspecified: Secondary | ICD-10-CM | POA: Diagnosis not present

## 2015-09-10 DIAGNOSIS — E1129 Type 2 diabetes mellitus with other diabetic kidney complication: Secondary | ICD-10-CM | POA: Diagnosis not present

## 2015-09-10 DIAGNOSIS — I10 Essential (primary) hypertension: Secondary | ICD-10-CM | POA: Diagnosis not present

## 2015-10-03 ENCOUNTER — Ambulatory Visit (INDEPENDENT_AMBULATORY_CARE_PROVIDER_SITE_OTHER): Payer: Medicare Other | Admitting: Nurse Practitioner

## 2015-10-03 ENCOUNTER — Encounter: Payer: Self-pay | Admitting: Nurse Practitioner

## 2015-10-03 ENCOUNTER — Other Ambulatory Visit: Payer: Self-pay

## 2015-10-03 VITALS — BP 131/68 | HR 73 | Temp 97.0°F | Ht 69.0 in | Wt 212.8 lb

## 2015-10-03 DIAGNOSIS — Z86010 Personal history of colonic polyps: Secondary | ICD-10-CM

## 2015-10-03 DIAGNOSIS — Z1211 Encounter for screening for malignant neoplasm of colon: Secondary | ICD-10-CM

## 2015-10-03 MED ORDER — PEG 3350-KCL-NA BICARB-NACL 420 G PO SOLR: 4000.0000 mL | Freq: Once | ORAL | Status: DC

## 2015-10-03 NOTE — Patient Instructions (Signed)
1. We will schedule your procedure for you. 2. Return for follow-up based on postprocedure recommendations. 

## 2015-10-03 NOTE — Assessment & Plan Note (Signed)
Patient with a history of tubular adenoma on previous colonoscopies. Last colonoscopy 5 years ago found a cecal polyp which was positive for tubular adenoma on surgical pathology. Recommended 5 year repeat exam. Today he is generally asymptomatic from a GI standpoint. We will proceed with a surveillance colonoscopy at this time.  The patient is not on any anticoagulants, anxiolytics, antidepressants. He is on trazodone. Drinks as often as once a day or as little as none for several weeks. Will have as many as 2-3 drinks per sitting. Previous procedure under similar circumstances completed adequately under conscious sedation. Conscious sedation should likely be adequate for his procedure this time.

## 2015-10-03 NOTE — Progress Notes (Signed)
Primary Care Physician:  [name], MD Primary Gastroenterologist:  Dr. Gala Romney  Chief Complaint  Patient presents with  . Colonoscopy    hx of adenomatous polyps    HPI:   Chris Guerrero is a 75 y.o. male who presents for surveillance colonoscopy. Last colonoscopy dated 09/04/2010 for history of colonic adenomas. Findings included normal rectum, pancolonic diverticula, cecal polyp status post removal. Polyp found to be tubular adenoma on surgical pathology. Recommended 5 year repeat surveillance colonoscopy.  Today he states he's doing well. Denies abdominal pain, N/V, melena, unintentional weight loss, changes in bowel habits, fever, chills. Rare scant toilet tissue hematochezia associated with intermittent constipation which is well controlled typically with Miralax. Typically has one bowel movement daily typically [place] 4. Denies chest pain, dyspnea, dizziness, lightheadedness, syncope, near syncope. Denies any other upper or lower GI symptoms.  Past Medical History  Diagnosis Date  . Hypertension   . Hyperlipemia     Past Surgical History  Procedure Laterality Date  . Cholecystectomy    . Hernia repair    . Colonoscopy  09/04/2010    RMR: diverticula, cecal polyp = tubular adenoma; Repeat in 5 years    Current Outpatient Prescriptions  Medication Sig Dispense Refill  . aspirin 81 MG tablet Take 81 mg by mouth daily.    [name] atorvastatin (LIPITOR) 10 MG tablet Take 10 mg by mouth daily.    [name] doxazosin (CARDURA) 8 MG tablet Take 4 mg by mouth at bedtime.     [name] lisinopril (PRINIVIL,ZESTRIL) 40 MG tablet Take 40 mg by mouth daily.    . Multiple Vitamin (MULTIVITAMIN) tablet Take 1 tablet by mouth daily.    . ranitidine (ZANTAC) 150 MG capsule Take 150 mg by mouth daily.    . traZODone (DESYREL) 50 MG tablet Take 50 mg by mouth at bedtime.     No current facility-administered medications for this visit.    Allergies as of 10/03/2015  . (No Known Allergies)    Family  History  Problem Relation Age of Onset  . Colon cancer Neg Hx     Social History   Social History  . Marital Status: Single    Spouse Name: N/A  . Number of Children: N/A  . Years of Education: N/A   Occupational History  . Not on file.   Social History Main Topics  . Smoking status: Never Smoker   . Smokeless tobacco: Current User    Types: Chew     Comment: rarely chews tobacco  . Alcohol Use: 0.0 oz/week    0 Standard drinks or equivalent per week     Comment: As often as once a day, as much as 2-3 per sitting.  . Drug Use: No  . Sexual Activity: Not on file   Other Topics Concern  . Not on file   Social History Narrative    Review of Systems: General: Negative for anorexia, weight loss, fever, chills, fatigue, weakness. ENT: Negative for hoarseness, difficulty swallowing. CV: Negative for chest pain, angina, palpitations, peripheral edema.  Respiratory: Negative for dyspnea at rest, cough, sputum, wheezing.  GI: See history of present illness. MS: Occasional joint aches.  Derm: Negative for rash or itching.  Endo: Negative for unusual weight change.  Heme: Negative for bruising or bleeding. Allergy: Negative for rash or hives.    Physical Exam: BP 131/68 mmHg  Pulse 73  Temp(Src) 97 F (36.1 C) (Oral)  Ht 5\' 9"  (1.753 m)  Wt 212 lb 12.8  oz (96.525 kg)  BMI 31.41 kg/m2 General:   Obese male, alert and oriented. Pleasant and cooperative. Well-nourished and well-developed.  Head:  Normocephalic and atraumatic. Eyes:  Without icterus, sclera clear and conjunctiva pink.  Ears:  Normal auditory acuity. Cardiovascular:  S1, S2 present without murmurs appreciated. Extremities without clubbing or edema. Respiratory:  Clear to auscultation bilaterally. No wheezes, rales, or rhonchi. No distress.  Gastrointestinal:  +BS, rounded but soft, non-tender and non-distended. No HSM noted. No guarding or rebound. Area near the midline of what feels to be a small hernia,  nontender and soft.  Rectal:  Deferred  Musculoskalatal:  Symmetrical without gross deformities. Skin:  Intact without significant lesions or rashes. Neurologic:  Alert and oriented x4;  grossly normal neurologically. Psych:  Alert and cooperative. Normal mood and affect. Heme/Lymph/Immune: No excessive bruising noted.    10/03/2015 10:07 AM   Disclaimer: This note was dictated with voice recognition software. Similar sounding words can inadvertently be transcribed and may not be corrected upon review.

## 2015-10-03 NOTE — Progress Notes (Signed)
cc'ed to pcp °

## 2015-10-10 ENCOUNTER — Ambulatory Visit (HOSPITAL_COMMUNITY)
Admission: RE | Admit: 2015-10-10 | Discharge: 2015-10-10 | Disposition: A | Discharge status: Home Health | Payer: Medicare Other | Source: Ambulatory Visit | Attending: Internal Medicine | Admitting: Internal Medicine

## 2015-10-10 ENCOUNTER — Encounter (HOSPITAL_COMMUNITY): Payer: Self-pay | Admitting: *Deleted

## 2015-10-10 ENCOUNTER — Encounter (HOSPITAL_COMMUNITY)
Admission: RE | Disposition: A | Discharge status: Home Health | Payer: Self-pay | Source: Ambulatory Visit | Attending: Internal Medicine

## 2015-10-10 DIAGNOSIS — E785 Hyperlipidemia, unspecified: Secondary | ICD-10-CM | POA: Insufficient documentation

## 2015-10-10 DIAGNOSIS — Z860100 Personal history of colon polyps, unspecified: Secondary | ICD-10-CM | POA: Insufficient documentation

## 2015-10-10 DIAGNOSIS — Z1211 Encounter for screening for malignant neoplasm of colon: Secondary | ICD-10-CM | POA: Insufficient documentation

## 2015-10-10 DIAGNOSIS — I10 Essential (primary) hypertension: Secondary | ICD-10-CM | POA: Insufficient documentation

## 2015-10-10 DIAGNOSIS — Z79899 Other long term (current) drug therapy: Secondary | ICD-10-CM | POA: Insufficient documentation

## 2015-10-10 DIAGNOSIS — Z7982 Long term (current) use of aspirin: Secondary | ICD-10-CM | POA: Diagnosis not present

## 2015-10-10 DIAGNOSIS — D120 Benign neoplasm of cecum: Secondary | ICD-10-CM | POA: Diagnosis not present

## 2015-10-10 DIAGNOSIS — K5730 Diverticulosis of large intestine without perforation or abscess without bleeding: Secondary | ICD-10-CM | POA: Insufficient documentation

## 2015-10-10 DIAGNOSIS — F17220 Nicotine dependence, chewing tobacco, uncomplicated: Secondary | ICD-10-CM | POA: Diagnosis not present

## 2015-10-10 HISTORY — PX: COLONOSCOPY: SHX5424

## 2015-10-10 SURGERY — COLONOSCOPY
Anesthesia: Moderate Sedation

## 2015-10-10 MED ORDER — ONDANSETRON HCL 4 MG/2ML IJ SOLN
INTRAMUSCULAR | Status: DC | PRN
  Administered 2015-10-10: 4 mg via INTRAVENOUS

## 2015-10-10 MED ORDER — MIDAZOLAM HCL 5 MG/5ML IJ SOLN
INTRAMUSCULAR | Status: DC | PRN
  Administered 2015-10-10: 2 mg via INTRAVENOUS
  Administered 2015-10-10: 1 mg via INTRAVENOUS

## 2015-10-10 MED ORDER — ONDANSETRON HCL 4 MG/2ML IJ SOLN
INTRAMUSCULAR | Status: AC
  Filled 2015-10-10: qty 2

## 2015-10-10 MED ORDER — MEPERIDINE HCL 100 MG/ML IJ SOLN
INTRAMUSCULAR | Status: DC | PRN
  Administered 2015-10-10: 50 mg via INTRAVENOUS
  Administered 2015-10-10: 25 mg via INTRAVENOUS

## 2015-10-10 MED ORDER — SODIUM CHLORIDE 0.9 % IV SOLN
INTRAVENOUS | Status: DC
  Administered 2015-10-10: 12:00:00 via INTRAVENOUS

## 2015-10-10 MED ORDER — MEPERIDINE HCL 100 MG/ML IJ SOLN
INTRAMUSCULAR | Status: AC
  Filled 2015-10-10: qty 2

## 2015-10-10 MED ORDER — STERILE WATER FOR IRRIGATION IR SOLN
Status: DC | PRN
  Administered 2015-10-10: 13:00:00

## 2015-10-10 MED ORDER — MIDAZOLAM HCL 5 MG/5ML IJ SOLN
INTRAMUSCULAR | Status: AC
  Filled 2015-10-10: qty 10

## 2015-10-10 NOTE — Discharge Instructions (Signed)
Colonoscopy Discharge Instructions  Read the instructions outlined below and refer to this sheet in the next few weeks. These discharge instructions provide you with general information on caring for yourself after you leave the hospital. Your doctor may also give you specific instructions. While your treatment has been planned according to the most current medical practices available, unavoidable complications occasionally occur. If you have any problems or questions after discharge, call Dr. Gala Guerrero at [phone]. ACTIVITY  You may resume your regular activity, but move at a slower pace for the next 24 hours.   Take frequent rest periods for the next 24 hours.   Walking will help get rid of the air and reduce the bloated feeling in your belly (abdomen).   No driving for 24 hours (because of the medicine (anesthesia) used during the test).    Do not sign any important legal documents or operate any machinery for 24 hours (because of the anesthesia used during the test).  NUTRITION  Drink plenty of fluids.   You may resume your normal diet as instructed by your doctor.   Begin with a light meal and progress to your normal diet. Heavy or fried foods are harder to digest and may make you feel sick to your stomach (nauseated).   Avoid alcoholic beverages for 24 hours or as instructed.  MEDICATIONS  You may resume your normal medications unless your doctor tells you otherwise.  WHAT YOU CAN EXPECT TODAY  Some feelings of bloating in the abdomen.   Passage of more gas than usual.   Spotting of blood in your stool or on the toilet paper.  IF YOU HAD POLYPS REMOVED DURING THE COLONOSCOPY:  No aspirin products for 7 days or as instructed.   No alcohol for 7 days or as instructed.   Eat a soft diet for the next 24 hours.  FINDING OUT THE RESULTS OF YOUR TEST Not all test results are available during your visit. If your test results are not back during the visit, make an appointment  with your caregiver to find out the results. Do not assume everything is normal if you have not heard from your caregiver or the medical facility. It is important for you to follow up on all of your test results.  SEEK IMMEDIATE MEDICAL ATTENTION IF:  You have more than a spotting of blood in your stool.   Your belly is swollen (abdominal distention).   You are nauseated or vomiting.   You have a temperature over 101.   You have abdominal pain or discomfort that is severe or gets worse throughout the day.   Diverticulosis and polyp information provided.  Further recommendations to follow pending review of pathology report     Diverticulosis Diverticulosis is the condition that develops when small pouches (diverticula) form in the wall of your colon. Your colon, or large intestine, is where water is absorbed and stool is formed. The pouches form when the inside layer of your colon pushes through weak spots in the outer layers of your colon. CAUSES  No one knows exactly what causes diverticulosis. RISK FACTORS  Being older than 66. Your risk for this condition increases with age. Diverticulosis is rare in people younger than 40 years. By age 69, almost everyone has it.  Eating a low-fiber diet.  Being frequently constipated.  Being overweight.  Not getting enough exercise.  Smoking.  Taking over-the-counter pain medicines, like aspirin and ibuprofen. SYMPTOMS  Most people with diverticulosis do not have symptoms. DIAGNOSIS  Because diverticulosis often has no symptoms, health care providers often discover the condition during an exam for other colon problems. In many cases, a health care provider will diagnose diverticulosis while using a flexible scope to examine the colon (colonoscopy). TREATMENT  If you have never developed an infection related to diverticulosis, you may not need treatment. If you have had an infection before, treatment may include:  Eating more fruits,  vegetables, and grains.  Taking a fiber supplement.  Taking a live bacteria supplement (probiotic).  Taking medicine to relax your colon. HOME CARE INSTRUCTIONS   Drink at least 6-8 glasses of water each day to prevent constipation.  Try not to strain when you have a bowel movement.  Keep all follow-up appointments. If you have had an infection before:  Increase the fiber in your diet as directed by your health care provider or dietitian.  Take a dietary fiber supplement if your health care provider approves.  Only take medicines as directed by your health care provider. SEEK MEDICAL CARE IF:   You have abdominal pain.  You have bloating.  You have cramps.  You have not gone to the bathroom in 3 days. SEEK IMMEDIATE MEDICAL CARE IF:   Your pain gets worse.  Yourbloating becomes very bad.  You have a fever or chills, and your symptoms suddenly get worse.  You begin vomiting.  You have bowel movements that are bloody or black. MAKE SURE YOU:  Understand these instructions.  Will watch your condition.  Will get help right away if you are not doing well or get worse.   This information is not intended to replace advice given to you by your health care provider. Make sure you discuss any questions you have with your health care provider.   Document Released: 02/27/2004 Document Revised: 06/06/2013 Document Reviewed: 04/26/2013 Elsevier Interactive Patient Education 2016 [employer] Colon Polyps Polyps are lumps of extra tissue growing inside the body. Polyps can grow in the large intestine (colon). Most colon polyps are noncancerous (benign). However, some colon polyps can become cancerous over time. Polyps that are larger than a pea may be harmful. To be safe, caregivers remove and test all polyps. CAUSES  Polyps form when mutations in the genes cause your cells to grow and divide even though no more tissue is needed. RISK FACTORS There are a number of risk  factors that can increase your chances of getting colon polyps. They include:  Being older than 50 years.  Family history of colon polyps or colon cancer.  Long-term colon diseases, such as colitis or Crohn disease.  Being overweight.  Smoking.  Being inactive.  Drinking too much alcohol. SYMPTOMS  Most small polyps do not cause symptoms. If symptoms are present, they may include:  Blood in the stool. The stool may look dark red or black.  Constipation or diarrhea that lasts longer than 1 week. DIAGNOSIS People often do not know they have polyps until their caregiver finds them during a regular checkup. Your caregiver can use 4 tests to check for polyps:  Digital rectal exam. The caregiver wears gloves and feels inside the rectum. This test would find polyps only in the rectum.  Barium enema. The caregiver puts a liquid called barium into your rectum before taking X-rays of your colon. Barium makes your colon look white. Polyps are dark, so they are easy to see in the X-ray pictures.  Sigmoidoscopy. A thin, flexible tube (sigmoidoscope) is placed into your rectum. The sigmoidoscope has a  light and tiny camera in it. The caregiver uses the sigmoidoscope to look at the last third of your colon.  Colonoscopy. This test is like sigmoidoscopy, but the caregiver looks at the entire colon. This is the most common method for finding and removing polyps. TREATMENT  Any polyps will be removed during a sigmoidoscopy or colonoscopy. The polyps are then tested for cancer. PREVENTION  To help lower your risk of getting more colon polyps:  Eat plenty of fruits and vegetables. Avoid eating fatty foods.  Do not smoke.  Avoid drinking alcohol.  Exercise every day.  Lose weight if recommended by your caregiver.  Eat plenty of calcium and folate. Foods that are rich in calcium include milk, cheese, and broccoli. Foods that are rich in folate include chickpeas, kidney beans, and  spinach. HOME CARE INSTRUCTIONS Keep all follow-up appointments as directed by your caregiver. You may need periodic exams to check for polyps. SEEK MEDICAL CARE IF: You notice bleeding during a bowel movement.   This information is not intended to replace advice given to you by your health care provider. Make sure you discuss any questions you have with your health care provider.   Document Released: 02/26/2004 Document Revised: 06/22/2014 Document Reviewed: 08/11/2011 Elsevier Interactive Patient Education [employer].

## 2015-10-10 NOTE — Interval H&P Note (Signed)
History and Physical Interval Note:  10/10/2015 1:01 PM  Chris Guerrero  has presented today for surgery, with the diagnosis of history of colon polyp, screening colonoscopy  The various methods of treatment have been discussed with the patient and family. After consideration of risks, benefits and other options for treatment, the patient has consented to  Procedure(s) with comments: COLONOSCOPY (N/A) - 1300 as a surgical intervention .  The patient's history has been reviewed, patient examined, no change in status, stable for surgery.  I have reviewed the patient's chart and labs.  Questions were answered to the patient's satisfaction.     Chris Guerrero   No change. Surveillance colonoscopy per plan.The risks, benefits, limitations, alternatives and imponderables have been reviewed with the patient. Questions have been answered. All parties are agreeable.

## 2015-10-10 NOTE — Op Note (Signed)
[facility] Patient Name: Chris Guerrero Procedure Date: 10/10/2015 1:10 PM MRN: [id number] Date of Birth: 08/15/1940 Attending MD: Norvel Richards , MD CSN: [id number] Age: 75 Admit Type: Outpatient Procedure:                Colonoscopy with biopsy Indications:              Surveillance: Personal history of adenomatous                            polyps on last colonoscopy 5 years ago Providers:                Norvel Richards, MD, Gwenlyn Fudge, RN, Georgeann Oppenheim, Technician Referring MD:              Medicines:                Midazolam 3 mg IV, Meperidine 75 mg IV, Ondansetron                            4 mg IV Complications:            No immediate complications. Estimated Blood Loss:     Estimated blood loss was minimal. Procedure:                Pre-Anesthesia Assessment:                           - Prior to the procedure, a History and Physical                            was performed, and patient medications and                            allergies were reviewed. The patient's tolerance of                            previous anesthesia was also reviewed. The risks                            and benefits of the procedure and the sedation                            options and risks were discussed with the patient.                            All questions were answered, and informed consent                            was obtained. Prior Anticoagulants: The patient has                            taken no previous anticoagulant or antiplatelet  agents. ASA Grade Assessment: II - A patient with                            mild systemic disease. After reviewing the risks                            and benefits, the patient was deemed in                            satisfactory condition to undergo the procedure.                           After obtaining informed consent, the colonoscope                            was  passed under direct vision. Throughout the                            procedure, the patient's blood pressure, pulse, and                            oxygen saturations were monitored continuously. The                            EC-[id number] scope was introduced through                            the anus and advanced to the the cecum, identified                            by appendiceal orifice and ileocecal valve. The                            ileocecal valve, appendiceal orifice, and rectum                            were photographed. The entire colon was well                            visualized. The colonoscopy was performed without                            difficulty. The patient tolerated the procedure                            well. The quality of the bowel preparation was                            adequate. Scope In: 1:15:32 PM Scope Out: 1:27:43 PM Scope Withdrawal Time: 0 hours 7 minutes 29 seconds  Total Procedure Duration: 0 hours 12 minutes 11 seconds  Findings:      The perianal and digital rectal examinations were normal.      Scattered medium-mouthed diverticula were found in the entire colon.  Two sessile polyps were found in the cecum. The polyps were 2 to 3 mm in       size. These polyps were removed with a cold biopsy forceps. Resection       and retrieval were complete. Estimated blood loss was minimal.      The exam was otherwise without abnormality on direct and retroflexion       views. Impression:               - Diverticulosis in the entire examined colon.                           - Two 2 to 3 mm polyps in the cecum, removed with a                            cold biopsy forceps. Resected and retrieved.                           - The examination was otherwise normal on direct                            and retroflexion views. Moderate Sedation:      Moderate (conscious) sedation was administered by the endoscopy nurse       and supervised by  the endoscopist. The following parameters were       monitored: oxygen saturation, heart rate, blood pressure, respiratory       rate, EKG, adequacy of pulmonary ventilation, and response to care.       Total physician intraservice time was 18 minutes. Recommendation:           - Patient has a contact number available for                            emergencies. The signs and symptoms of potential                            delayed complications were discussed with the                            patient. Return to normal activities tomorrow.                            Written discharge instructions were provided to the                            patient.                           - Resume previous diet.                           - Continue present medications.                           - Await pathology results.                           -  Repeat colonoscopy date to be determined after                            pending pathology results are reviewed for                            surveillance based on pathology results.                           - Return to GI clinic PRN. Procedure Code(s):        --- Professional ---                           [phone], Colonoscopy, flexible; with biopsy, single                            or multiple                           99152, Moderate sedation services provided by the                            same physician or other qualified health care                            professional performing the diagnostic or                            therapeutic service that the sedation supports,                            requiring the presence of an independent trained                            observer to assist in the monitoring of the                            patient's level of consciousness and physiological                            status; initial 15 minutes of intraservice time,                            patient age 10 years or older Diagnosis Code(s):         --- Professional ---                           Z86.010, Personal history of colonic polyps                           D12.0, Benign neoplasm of cecum                           K57.30, Diverticulosis of large intestine without  perforation or abscess without bleeding CPT copyright 2016 [employer]. All rights reserved. The codes documented in this report are preliminary and upon coder review may  be revised to meet current compliance requirements. Cristopher Estimable. Tonyia Marschall, MD Norvel Richards, MD 10/10/2015 1:35:27 PM This report has been signed electronically. Number of Addenda: 0

## 2015-10-10 NOTE — H&P (View-Only) (Signed)
Primary Care Physician:  [name], MD Primary Gastroenterologist:  Dr. Gala Romney  Chief Complaint  Patient presents with  . Colonoscopy    hx of adenomatous polyps    HPI:   Chris Guerrero is a 75 y.o. male who presents for surveillance colonoscopy. Last colonoscopy dated 09/04/2010 for history of colonic adenomas. Findings included normal rectum, pancolonic diverticula, cecal polyp status post removal. Polyp found to be tubular adenoma on surgical pathology. Recommended 5 year repeat surveillance colonoscopy.  Today he states he's doing well. Denies abdominal pain, N/V, melena, unintentional weight loss, changes in bowel habits, fever, chills. Rare scant toilet tissue hematochezia associated with intermittent constipation which is well controlled typically with Miralax. Typically has one bowel movement daily typically [place] 4. Denies chest pain, dyspnea, dizziness, lightheadedness, syncope, near syncope. Denies any other upper or lower GI symptoms.  Past Medical History  Diagnosis Date  . Hypertension   . Hyperlipemia     Past Surgical History  Procedure Laterality Date  . Cholecystectomy    . Hernia repair    . Colonoscopy  09/04/2010    RMR: diverticula, cecal polyp = tubular adenoma; Repeat in 5 years    Current Outpatient Prescriptions  Medication Sig Dispense Refill  . aspirin 81 MG tablet Take 81 mg by mouth daily.    [name] atorvastatin (LIPITOR) 10 MG tablet Take 10 mg by mouth daily.    [name] doxazosin (CARDURA) 8 MG tablet Take 4 mg by mouth at bedtime.     [name] lisinopril (PRINIVIL,ZESTRIL) 40 MG tablet Take 40 mg by mouth daily.    . Multiple Vitamin (MULTIVITAMIN) tablet Take 1 tablet by mouth daily.    . ranitidine (ZANTAC) 150 MG capsule Take 150 mg by mouth daily.    . traZODone (DESYREL) 50 MG tablet Take 50 mg by mouth at bedtime.     No current facility-administered medications for this visit.    Allergies as of 10/03/2015  . (No Known Allergies)    Family  History  Problem Relation Age of Onset  . Colon cancer Neg Hx     Social History   Social History  . Marital Status: Single    Spouse Name: N/A  . Number of Children: N/A  . Years of Education: N/A   Occupational History  . Not on file.   Social History Main Topics  . Smoking status: Never Smoker   . Smokeless tobacco: Current User    Types: Chew     Comment: rarely chews tobacco  . Alcohol Use: 0.0 oz/week    0 Standard drinks or equivalent per week     Comment: As often as once a day, as much as 2-3 per sitting.  . Drug Use: No  . Sexual Activity: Not on file   Other Topics Concern  . Not on file   Social History Narrative    Review of Systems: General: Negative for anorexia, weight loss, fever, chills, fatigue, weakness. ENT: Negative for hoarseness, difficulty swallowing. CV: Negative for chest pain, angina, palpitations, peripheral edema.  Respiratory: Negative for dyspnea at rest, cough, sputum, wheezing.  GI: See history of present illness. MS: Occasional joint aches.  Derm: Negative for rash or itching.  Endo: Negative for unusual weight change.  Heme: Negative for bruising or bleeding. Allergy: Negative for rash or hives.    Physical Exam: BP 131/68 mmHg  Pulse 73  Temp(Src) 97 F (36.1 C) (Oral)  Ht 5\' 9"  (1.753 m)  Wt 212 lb 12.8  oz (96.525 kg)  BMI 31.41 kg/m2 General:   Obese male, alert and oriented. Pleasant and cooperative. Well-nourished and well-developed.  Head:  Normocephalic and atraumatic. Eyes:  Without icterus, sclera clear and conjunctiva pink.  Ears:  Normal auditory acuity. Cardiovascular:  S1, S2 present without murmurs appreciated. Extremities without clubbing or edema. Respiratory:  Clear to auscultation bilaterally. No wheezes, rales, or rhonchi. No distress.  Gastrointestinal:  +BS, rounded but soft, non-tender and non-distended. No HSM noted. No guarding or rebound. Area near the midline of what feels to be a small hernia,  nontender and soft.  Rectal:  Deferred  Musculoskalatal:  Symmetrical without gross deformities. Skin:  Intact without significant lesions or rashes. Neurologic:  Alert and oriented x4;  grossly normal neurologically. Psych:  Alert and cooperative. Normal mood and affect. Heme/Lymph/Immune: No excessive bruising noted.    10/03/2015 10:07 AM   Disclaimer: This note was dictated with voice recognition software. Similar sounding words can inadvertently be transcribed and may not be corrected upon review.

## 2015-10-15 ENCOUNTER — Encounter: Payer: Self-pay | Admitting: Internal Medicine

## 2015-10-16 ENCOUNTER — Encounter (HOSPITAL_COMMUNITY): Payer: Self-pay | Admitting: Internal Medicine

## 2015-11-06 DIAGNOSIS — H25813 Combined forms of age-related cataract, bilateral: Secondary | ICD-10-CM | POA: Diagnosis not present

## 2015-11-06 DIAGNOSIS — H10413 Chronic giant papillary conjunctivitis, bilateral: Secondary | ICD-10-CM | POA: Diagnosis not present

## 2015-12-20 DIAGNOSIS — M545 Low back pain: Secondary | ICD-10-CM | POA: Diagnosis not present

## 2016-01-06 DIAGNOSIS — E785 Hyperlipidemia, unspecified: Secondary | ICD-10-CM | POA: Diagnosis not present

## 2016-01-06 DIAGNOSIS — E119 Type 2 diabetes mellitus without complications: Secondary | ICD-10-CM | POA: Diagnosis not present

## 2016-01-06 DIAGNOSIS — Z79899 Other long term (current) drug therapy: Secondary | ICD-10-CM | POA: Diagnosis not present

## 2016-01-13 DIAGNOSIS — E785 Hyperlipidemia, unspecified: Secondary | ICD-10-CM | POA: Diagnosis not present

## 2016-01-13 DIAGNOSIS — I10 Essential (primary) hypertension: Secondary | ICD-10-CM | POA: Diagnosis not present

## 2016-01-13 DIAGNOSIS — E1129 Type 2 diabetes mellitus with other diabetic kidney complication: Secondary | ICD-10-CM | POA: Diagnosis not present

## 2016-02-06 ENCOUNTER — Emergency Department (HOSPITAL_COMMUNITY)
Admission: EM | Admit: 2016-02-06 | Discharge: 2016-02-06 | Disposition: A | Discharge status: Home / Self Care | Payer: Medicare Other | Attending: Emergency Medicine | Admitting: Emergency Medicine

## 2016-02-06 ENCOUNTER — Encounter (HOSPITAL_COMMUNITY): Payer: Self-pay

## 2016-02-06 ENCOUNTER — Emergency Department (HOSPITAL_COMMUNITY): Payer: Medicare Other

## 2016-02-06 DIAGNOSIS — M25552 Pain in left hip: Secondary | ICD-10-CM | POA: Diagnosis not present

## 2016-02-06 DIAGNOSIS — F17200 Nicotine dependence, unspecified, uncomplicated: Secondary | ICD-10-CM | POA: Insufficient documentation

## 2016-02-06 DIAGNOSIS — Z7982 Long term (current) use of aspirin: Secondary | ICD-10-CM | POA: Insufficient documentation

## 2016-02-06 DIAGNOSIS — M5136 Other intervertebral disc degeneration, lumbar region: Secondary | ICD-10-CM | POA: Diagnosis not present

## 2016-02-06 DIAGNOSIS — I10 Essential (primary) hypertension: Secondary | ICD-10-CM | POA: Diagnosis not present

## 2016-02-06 HISTORY — DX: Pure hypercholesterolemia, unspecified: E78.00

## 2016-02-06 MED ORDER — HYDROCODONE-ACETAMINOPHEN 5-325 MG PO TABS: ORAL_TABLET | ORAL | 0 refills | Status: DC

## 2016-02-06 MED ORDER — METHOCARBAMOL 500 MG PO TABS: 500.0000 mg | ORAL_TABLET | Freq: Three times a day (TID) | ORAL | 0 refills | Status: DC

## 2016-02-06 NOTE — ED Triage Notes (Signed)
Left hip pain x1 month. States "it feels like bugs crawling up my leg". Denies injury. States he has been waiting to see his pcp but not able to see him until Monday. Ambulatory to triage without difficulty.

## 2016-02-06 NOTE — Discharge Instructions (Signed)
You can alternate ice and heat to your back.  Call Dr. Ria Comment office to arrange a follow-up appt. For next week.  Return here for any worsening symptoms

## 2016-02-08 NOTE — ED Provider Notes (Signed)
[facility] DEPT Provider Note   CSN: [id number] Arrival date & time: 02/06/16  1100     History   Chief Complaint Chief Complaint  Patient presents with  . Hip Pain    HPI DAMIEL WORTHEY is a 75 y.o. male.  HPI   PASCUAL BUFKIN is a 75 y.o. male who presents to the Emergency Department complaining of left hip pain for one month.  He states the pain is intermittent and feels "like bugs crawling up my leg"  Pain is worse upon waking and certain positions.  Improves at rest.  He has seen his PMD for same and given diclofenac which he states is not helping.  He denies numbness or weakness of the LE's, abdominal pain, urine or bowel changes, fever.     Past Medical History:  Diagnosis Date  . High cholesterol   . Hyperlipemia   . Hypertension     Patient Active Problem List   Diagnosis Date Noted  . History of colonic polyps   . Diverticulosis of colon without hemorrhage   . History of adenomatous polyp of colon 10/03/2015    Past Surgical History:  Procedure Laterality Date  . CHOLECYSTECTOMY    . COLONOSCOPY  09/04/2010   RMR: diverticula, cecal polyp = tubular adenoma; Repeat in 5 years  . COLONOSCOPY N/A 10/10/2015   Procedure: COLONOSCOPY;  Surgeon: Daneil Dolin, MD;  Location: [facility] SUITE;  Service: Endoscopy;  Laterality: N/A;  1300  . HERNIA REPAIR         Home Medications    Prior to Admission medications   Medication Sig Start Date End Date Taking? Authorizing Provider  aspirin 81 MG tablet Take 81 mg by mouth daily.    Historical Provider, MD  atorvastatin (LIPITOR) 10 MG tablet Take 10 mg by mouth daily.    Historical Provider, MD  doxazosin (CARDURA) 8 MG tablet Take 4 mg by mouth at bedtime.     Historical Provider, MD  HYDROcodone-acetaminophen (NORCO/VICODIN) 5-325 MG tablet Take one tab po q 4-6 hrs prn pain 02/06/16   Margarita Croke, [name]  lisinopril (PRINIVIL,ZESTRIL) 40 MG tablet Take 40 mg by mouth daily.    Historical Provider, MD    methocarbamol (ROBAXIN) 500 MG tablet Take 1 tablet (500 mg total) by mouth 3 (three) times daily. 02/06/16   Delaney Perona, [name]  Multiple Vitamin (MULTIVITAMIN) tablet Take 1 tablet by mouth daily.    Historical Provider, MD  polyethylene glycol-electrolytes (NULYTELY/GOLYTELY) 420 g solution Take 4,000 mLs by mouth once. 10/03/15   [name], NP  ranitidine (ZANTAC) 150 MG capsule Take 150 mg by mouth daily.    Historical Provider, MD  traZODone (DESYREL) 50 MG tablet Take 50 mg by mouth at bedtime.    Historical Provider, MD    Family History Family History  Problem Relation Age of Onset  . Colon cancer Neg Hx     Social History Social History  Substance Use Topics  . Smoking status: Never Smoker  . Smokeless tobacco: Current User    Types: Chew     Comment: rarely chews tobacco  . Alcohol use 0.0 oz/week     Comment: As often as once a day, as much as 2-3 per sitting.     Allergies   Review of patient's allergies indicates no known allergies.   Review of Systems Review of Systems  Constitutional: Negative for fever.  Respiratory: Negative for shortness of breath.   Gastrointestinal: Negative for abdominal pain,  constipation and vomiting.  Genitourinary: Negative for decreased urine volume, difficulty urinating, dysuria, flank pain and hematuria.  Musculoskeletal: Positive for arthralgias (left hip pain) and back pain. Negative for joint swelling.  Skin: Negative for rash.  Neurological: Negative for weakness and numbness.  All other systems reviewed and are negative.    Physical Exam Updated Vital Signs BP 161/76   Pulse 69   Temp 98.1 F (36.7 C) (Oral)   Resp 18   Ht 5\' 9"  (1.753 m)   Wt 97.1 kg   SpO2 98%   BMI 31.60 kg/m   Physical Exam  Constitutional: He is oriented to person, place, and time. He appears well-developed and well-nourished. No distress.  HENT:  Head: Normocephalic and atraumatic.  Neck: Normal range of motion. Neck supple.   Cardiovascular: Normal rate, regular rhythm and intact distal pulses.   No murmur heard. Pulmonary/Chest: Effort normal and breath sounds normal. No respiratory distress.  Abdominal: Soft. He exhibits no distension. There is no tenderness.  Musculoskeletal: He exhibits tenderness. He exhibits no edema.       Lumbar back: He exhibits tenderness and pain. He exhibits normal range of motion, no swelling, no deformity, no laceration and normal pulse.  ttp of the lower lumbar spine and left paraspinal muscles.  DP pulses are brisk and symmetrical.  Distal sensation intact.  Pt has 5/5 strength against resistance of bilateral lower extremities.  No shortening or external rotation of the left hip   Neurological: He is alert and oriented to person, place, and time. He has normal strength. No sensory deficit. He exhibits normal muscle tone. Coordination and gait normal.  Reflex Scores:      Patellar reflexes are 2+ on the right side and 2+ on the left side.      Achilles reflexes are 2+ on the right side and 2+ on the left side. Skin: Skin is warm and dry. No rash noted.  Nursing note and vitals reviewed.    ED Treatments / Results  Labs (all labs ordered are listed, but only abnormal results are displayed) Labs Reviewed - No data to display  EKG  EKG Interpretation None       Radiology Dg Lumbar Spine Complete  Result Date: 02/06/2016 CLINICAL DATA:  Pain for 2 months.  No known trauma. EXAM: LUMBAR SPINE - COMPLETE 4+ VIEW COMPARISON:  None. FINDINGS: Minimal degenerative disc disease. Degenerative changes in the lower lumbar facets. No fracture for traumatic malalignment. Atherosclerotic change seen in the abdominal aorta. IMPRESSION: Mild degenerative disc disease. More degenerative changes seen in the lower lumbar facets. Atherosclerotic change in the abdominal aorta. Electronically Signed   By: Dorise Bullion III M.D   On: 02/06/2016 12:25   Dg Hip Unilat W Or Wo Pelvis 2-3 Views  Left  Result Date: 02/06/2016 CLINICAL DATA:  Left hip pain for 2 months without known injury. EXAM: DG HIP (WITH OR WITHOUT PELVIS) 2-3V LEFT COMPARISON:  None. FINDINGS: There is no evidence of hip fracture or dislocation. There is no evidence of arthropathy or other focal bone abnormality. IMPRESSION: Normal left hip. Electronically Signed   By: Marijo Conception, M.D.   On: 02/06/2016 12:25    Procedures Procedures (including critical care time)  Medications Ordered in ED Medications - No data to display   Initial Impression / Assessment and Plan / ED Course  I have reviewed the triage vital signs and the nursing notes.  Pertinent labs & imaging results that were available during my  care of the patient were reviewed by me and considered in my medical decision making (see chart for details).  Clinical Course    Pt ambulatory in the dept.  Gait steady.  No focal neuro deficits.  No concerning sx's for septic joint.  Pain likely related to deg disc dz of the L spine.  Pt agrees to close PMD f/u.  Will treat symptomatically  Final Clinical Impressions(s) / ED Diagnoses   Final diagnoses:  Left hip pain    New Prescriptions Discharge Medication List as of 02/06/2016  1:09 PM    START taking these medications   Details  HYDROcodone-acetaminophen (NORCO/VICODIN) 5-325 MG tablet Take one tab po q 4-6 hrs prn pain, Print    methocarbamol (ROBAXIN) 500 MG tablet Take 1 tablet (500 mg total) by mouth 3 (three) times daily., Starting Thu 02/06/2016, Print         Tyquisha Sharps [place], [name] 02/08/16 [id number]    Tanna Furry, MD 02/11/16 [phone]

## 2016-02-13 DIAGNOSIS — M545 Low back pain: Secondary | ICD-10-CM | POA: Diagnosis not present

## 2016-04-03 DIAGNOSIS — Z23 Encounter for immunization: Secondary | ICD-10-CM | POA: Diagnosis not present

## 2016-04-23 DIAGNOSIS — N341 Nonspecific urethritis: Secondary | ICD-10-CM | POA: Diagnosis not present

## 2016-05-13 DIAGNOSIS — E119 Type 2 diabetes mellitus without complications: Secondary | ICD-10-CM | POA: Diagnosis not present

## 2016-05-15 DIAGNOSIS — R972 Elevated prostate specific antigen [PSA]: Secondary | ICD-10-CM | POA: Diagnosis not present

## 2016-05-20 DIAGNOSIS — I10 Essential (primary) hypertension: Secondary | ICD-10-CM | POA: Diagnosis not present

## 2016-05-20 DIAGNOSIS — E1129 Type 2 diabetes mellitus with other diabetic kidney complication: Secondary | ICD-10-CM | POA: Diagnosis not present

## 2016-05-21 DIAGNOSIS — H10413 Chronic giant papillary conjunctivitis, bilateral: Secondary | ICD-10-CM | POA: Diagnosis not present

## 2016-05-21 DIAGNOSIS — H25813 Combined forms of age-related cataract, bilateral: Secondary | ICD-10-CM | POA: Diagnosis not present

## 2016-07-06 DIAGNOSIS — H2512 Age-related nuclear cataract, left eye: Secondary | ICD-10-CM | POA: Diagnosis not present

## 2016-07-13 DIAGNOSIS — H2511 Age-related nuclear cataract, right eye: Secondary | ICD-10-CM | POA: Diagnosis not present

## 2016-07-13 DIAGNOSIS — H25812 Combined forms of age-related cataract, left eye: Secondary | ICD-10-CM | POA: Diagnosis not present

## 2016-07-13 DIAGNOSIS — H2512 Age-related nuclear cataract, left eye: Secondary | ICD-10-CM | POA: Diagnosis not present

## 2016-08-03 DIAGNOSIS — H2511 Age-related nuclear cataract, right eye: Secondary | ICD-10-CM | POA: Diagnosis not present

## 2016-08-03 DIAGNOSIS — H25811 Combined forms of age-related cataract, right eye: Secondary | ICD-10-CM | POA: Diagnosis not present

## 2016-09-18 DIAGNOSIS — Z961 Presence of intraocular lens: Secondary | ICD-10-CM | POA: Diagnosis not present

## 2016-09-23 DIAGNOSIS — E1129 Type 2 diabetes mellitus with other diabetic kidney complication: Secondary | ICD-10-CM | POA: Diagnosis not present

## 2016-09-23 DIAGNOSIS — I10 Essential (primary) hypertension: Secondary | ICD-10-CM | POA: Diagnosis not present

## 2016-09-23 DIAGNOSIS — E785 Hyperlipidemia, unspecified: Secondary | ICD-10-CM | POA: Diagnosis not present

## 2016-11-11 DIAGNOSIS — R972 Elevated prostate specific antigen [PSA]: Secondary | ICD-10-CM | POA: Diagnosis not present

## 2016-11-18 DIAGNOSIS — R972 Elevated prostate specific antigen [PSA]: Secondary | ICD-10-CM | POA: Diagnosis not present

## 2016-11-30 DIAGNOSIS — N341 Nonspecific urethritis: Secondary | ICD-10-CM | POA: Diagnosis not present

## 2017-01-20 DIAGNOSIS — E119 Type 2 diabetes mellitus without complications: Secondary | ICD-10-CM | POA: Diagnosis not present

## 2017-01-27 DIAGNOSIS — E1129 Type 2 diabetes mellitus with other diabetic kidney complication: Secondary | ICD-10-CM | POA: Diagnosis not present

## 2017-01-27 DIAGNOSIS — I700 Atherosclerosis of aorta: Secondary | ICD-10-CM | POA: Diagnosis not present

## 2017-01-27 DIAGNOSIS — I10 Essential (primary) hypertension: Secondary | ICD-10-CM | POA: Diagnosis not present

## 2017-03-11 DIAGNOSIS — Z961 Presence of intraocular lens: Secondary | ICD-10-CM | POA: Diagnosis not present

## 2017-03-18 DIAGNOSIS — Z23 Encounter for immunization: Secondary | ICD-10-CM | POA: Diagnosis not present

## 2017-05-25 DIAGNOSIS — E1129 Type 2 diabetes mellitus with other diabetic kidney complication: Secondary | ICD-10-CM | POA: Diagnosis not present

## 2017-06-01 DIAGNOSIS — I10 Essential (primary) hypertension: Secondary | ICD-10-CM | POA: Diagnosis not present

## 2017-06-01 DIAGNOSIS — N400 Enlarged prostate without lower urinary tract symptoms: Secondary | ICD-10-CM | POA: Diagnosis not present

## 2017-06-01 DIAGNOSIS — E119 Type 2 diabetes mellitus without complications: Secondary | ICD-10-CM | POA: Diagnosis not present

## 2017-09-28 DIAGNOSIS — E785 Hyperlipidemia, unspecified: Secondary | ICD-10-CM | POA: Diagnosis not present

## 2017-09-28 DIAGNOSIS — I10 Essential (primary) hypertension: Secondary | ICD-10-CM | POA: Diagnosis not present

## 2017-09-28 DIAGNOSIS — E1140 Type 2 diabetes mellitus with diabetic neuropathy, unspecified: Secondary | ICD-10-CM | POA: Diagnosis not present

## 2018-01-21 DIAGNOSIS — E1129 Type 2 diabetes mellitus with other diabetic kidney complication: Secondary | ICD-10-CM | POA: Diagnosis not present

## 2018-01-28 DIAGNOSIS — I700 Atherosclerosis of aorta: Secondary | ICD-10-CM | POA: Diagnosis not present

## 2018-01-28 DIAGNOSIS — E1129 Type 2 diabetes mellitus with other diabetic kidney complication: Secondary | ICD-10-CM | POA: Diagnosis not present

## 2018-01-28 DIAGNOSIS — I10 Essential (primary) hypertension: Secondary | ICD-10-CM | POA: Diagnosis not present

## 2018-03-21 DIAGNOSIS — Z23 Encounter for immunization: Secondary | ICD-10-CM | POA: Diagnosis not present

## 2018-05-09 ENCOUNTER — Other Ambulatory Visit: Payer: Self-pay

## 2018-05-09 ENCOUNTER — Emergency Department (HOSPITAL_COMMUNITY)
Admission: EM | Admit: 2018-05-09 | Discharge: 2018-05-09 | Disposition: A | Discharge status: Home / Self Care | Payer: Medicare Other | Attending: Emergency Medicine | Admitting: Emergency Medicine

## 2018-05-09 ENCOUNTER — Emergency Department (HOSPITAL_COMMUNITY): Payer: Medicare Other

## 2018-05-09 ENCOUNTER — Encounter (HOSPITAL_COMMUNITY): Payer: Self-pay | Admitting: Emergency Medicine

## 2018-05-09 DIAGNOSIS — Z79899 Other long term (current) drug therapy: Secondary | ICD-10-CM | POA: Diagnosis not present

## 2018-05-09 DIAGNOSIS — Y9389 Activity, other specified: Secondary | ICD-10-CM | POA: Diagnosis not present

## 2018-05-09 DIAGNOSIS — S299XXA Unspecified injury of thorax, initial encounter: Secondary | ICD-10-CM | POA: Diagnosis not present

## 2018-05-09 DIAGNOSIS — I10 Essential (primary) hypertension: Secondary | ICD-10-CM | POA: Diagnosis not present

## 2018-05-09 DIAGNOSIS — S20212A Contusion of left front wall of thorax, initial encounter: Secondary | ICD-10-CM | POA: Diagnosis not present

## 2018-05-09 DIAGNOSIS — F17220 Nicotine dependence, chewing tobacco, uncomplicated: Secondary | ICD-10-CM | POA: Diagnosis not present

## 2018-05-09 DIAGNOSIS — Y929 Unspecified place or not applicable: Secondary | ICD-10-CM | POA: Diagnosis not present

## 2018-05-09 DIAGNOSIS — S6992XA Unspecified injury of left wrist, hand and finger(s), initial encounter: Secondary | ICD-10-CM | POA: Diagnosis not present

## 2018-05-09 DIAGNOSIS — S60212A Contusion of left wrist, initial encounter: Secondary | ICD-10-CM | POA: Insufficient documentation

## 2018-05-09 DIAGNOSIS — W19XXXA Unspecified fall, initial encounter: Secondary | ICD-10-CM | POA: Diagnosis not present

## 2018-05-09 DIAGNOSIS — S298XXA Other specified injuries of thorax, initial encounter: Secondary | ICD-10-CM | POA: Diagnosis present

## 2018-05-09 DIAGNOSIS — Y999 Unspecified external cause status: Secondary | ICD-10-CM | POA: Diagnosis not present

## 2018-05-09 DIAGNOSIS — R0781 Pleurodynia: Secondary | ICD-10-CM | POA: Diagnosis not present

## 2018-05-09 DIAGNOSIS — Z7982 Long term (current) use of aspirin: Secondary | ICD-10-CM | POA: Diagnosis not present

## 2018-05-09 MED ORDER — HYDROCODONE-ACETAMINOPHEN 5-325 MG PO TABS: 1.0000 | ORAL_TABLET | Freq: Four times a day (QID) | ORAL | 0 refills | Status: DC | PRN

## 2018-05-09 NOTE — ED Triage Notes (Signed)
Pt stumbled and fell. Pt c/o pain to LT ribcage and LT wrist.

## 2018-05-16 DIAGNOSIS — E1129 Type 2 diabetes mellitus with other diabetic kidney complication: Secondary | ICD-10-CM | POA: Diagnosis not present

## 2018-05-21 NOTE — ED Provider Notes (Signed)
[facility] EMERGENCY DEPARTMENT Provider Note   CSN: [id number] Arrival date & time: 05/09/18  1614     History   Chief Complaint Chief Complaint  Patient presents with  . Fall    HPI Chris Guerrero is a 77 y.o. male.  HPI   77 year old male presenting after fall.  He was working with his brother when he lost his balance and fell onto his left side.  7 pain primarily in his left lateral chest.  Pain worse with movement and deep breathing.  He does not necessarily feel short of breath though.  He does not think he hit his head.  Denies any significant acute headache, neck or back pain.  Past Medical History:  Diagnosis Date  . High cholesterol   . Hyperlipemia   . Hypertension     Patient Active Problem List   Diagnosis Date Noted  . History of colonic polyps   . Diverticulosis of colon without hemorrhage   . History of adenomatous polyp of colon 10/03/2015    Past Surgical History:  Procedure Laterality Date  . CHOLECYSTECTOMY    . COLONOSCOPY  09/04/2010   RMR: diverticula, cecal polyp = tubular adenoma; Repeat in 5 years  . COLONOSCOPY N/A 10/10/2015   Procedure: COLONOSCOPY;  Surgeon: Daneil Dolin, MD;  Location: [facility];  Service: Endoscopy;  Laterality: N/A;  1300  . HERNIA REPAIR          Home Medications    Prior to Admission medications   Medication Sig Start Date End Date Taking? Authorizing Provider  aspirin 81 MG tablet Take 81 mg by mouth daily.    [provider]  atorvastatin (LIPITOR) 10 MG tablet Take 10 mg by mouth daily.    [provider]  doxazosin (CARDURA) 8 MG tablet Take 4 mg by mouth at bedtime.     [provider]  HYDROcodone-acetaminophen (NORCO/VICODIN) 5-325 MG tablet Take 1-2 tablets by mouth every 6 (six) hours as needed. 05/09/18   [name], MD  lisinopril (PRINIVIL,ZESTRIL) 40 MG tablet Take 40 mg by mouth daily.    [provider]  methocarbamol (ROBAXIN) 500 MG tablet Take  1 tablet (500 mg total) by mouth 3 (three) times daily. 02/06/16   Triplett, Tammy, [name]  Multiple Vitamin (MULTIVITAMIN) tablet Take 1 tablet by mouth daily.    [provider]  polyethylene glycol-electrolytes (NULYTELY/GOLYTELY) 420 g solution Take 4,000 mLs by mouth once. 10/03/15   [name], NP  ranitidine (ZANTAC) 150 MG capsule Take 150 mg by mouth daily.    [provider]  traZODone (DESYREL) 50 MG tablet Take 50 mg by mouth at bedtime.    [provider]    Family History Family History  Problem Relation Age of Onset  . Colon cancer Neg Hx     Social History Social History   Tobacco Use  . Smoking status: Never Smoker  . Smokeless tobacco: Current User    Types: Chew  . Tobacco comment: rarely chews tobacco  Substance Use Topics  . Alcohol use: Yes    Alcohol/week: 0.0 standard drinks    Comment: As often as once a day, as much as 2-3 per sitting.  . Drug use: No     Allergies   Patient has no known allergies.   Review of Systems Review of Systems  All systems reviewed and negative, other than as noted in HPI.  Physical Exam Updated Vital Signs BP (!) 156/86 (BP Location:  Right Arm)   Pulse (!) 102   Temp 98.3 F (36.8 C) (Oral)   Resp 16   Ht 5\' 9"  (1.753 m)   Wt 97.5 kg   SpO2 98%   BMI 31.75 kg/m   Physical Exam  Constitutional: He appears well-developed and well-nourished. No distress.  HENT:  Head: Normocephalic and atraumatic.  Eyes: Conjunctivae are normal. Right eye exhibits no discharge. Left eye exhibits no discharge.  Neck: Neck supple.  Cardiovascular: Normal rate, regular rhythm and normal heart sounds. Exam reveals no gallop and no friction rub.  No murmur heard. Pulmonary/Chest: Effort normal and breath sounds normal. No respiratory distress. He exhibits tenderness.  Tenderness to palpation left anterior to lateral chest.  Mild ecchymosis noted anteriorly.  No crepitus.  Breath sounds clear and symmetric  bilaterally.  Abdominal: Soft. He exhibits no distension. There is no tenderness.  Musculoskeletal: He exhibits no edema or tenderness.  Minimal swelling of the left wrist.  No discrete bony tenderness.  Can actively range.  Neurovascular intact distally.  Neurological: He is alert.  Skin: Skin is warm and dry.  Psychiatric: He has a normal mood and affect. His behavior is normal. Thought content normal.  Nursing note and vitals reviewed.    ED Treatments / Results  Labs (all labs ordered are listed, but only abnormal results are displayed) Labs Reviewed - No data to display  EKG None  Radiology No results found.   Dg Ribs Unilateral W/chest Left  Result Date: 05/09/2018 CLINICAL DATA:  Left anterior ribs pain post fall. EXAM: LEFT RIBS AND CHEST - 3+ VIEW COMPARISON:  None. FINDINGS: No fracture or other bone lesions are seen involving the ribs. There is no evidence of pneumothorax or pleural effusion. Both lungs are clear. Heart size and mediastinal contours are within normal limits. IMPRESSION: Negative. Electronically Signed   By: Fidela Salisbury M.D.   On: 05/09/2018 17:17   Dg Wrist Complete Left  Result Date: 05/09/2018 CLINICAL DATA:  Fall, landed on left side EXAM: LEFT WRIST - COMPLETE 3+ VIEW COMPARISON:  None. FINDINGS: There is no evidence of fracture or dislocation. There is no evidence of arthropathy or other focal bone abnormality. Soft tissues are unremarkable. IMPRESSION: Negative. Electronically Signed   By: Donavan Foil M.D.   On: 05/09/2018 17:20    Procedures Procedures (including critical care time)  Medications Ordered in ED Medications - No data to display   Initial Impression / Assessment and Plan / ED Course  I have reviewed the triage vital signs and the nursing notes.  Pertinent labs & imaging results that were available during my care of the patient were reviewed by me and considered in my medical decision making (see chart for  details).     Contusions after fall.  Negative imaging.  Plan symptomatic treatment.  Return precautions were discussed.  Final Clinical Impressions(s) / ED Diagnoses   Final diagnoses:  Contusion of left chest wall, initial encounter  Contusion of left wrist, initial encounter    ED Discharge Orders         Ordered    HYDROcodone-acetaminophen (NORCO/VICODIN) 5-325 MG tablet  Every 6 hours PRN     05/09/18 2107           [name], MD 05/21/18 2023

## 2018-05-24 DIAGNOSIS — I10 Essential (primary) hypertension: Secondary | ICD-10-CM | POA: Diagnosis not present

## 2018-05-24 DIAGNOSIS — E1159 Type 2 diabetes mellitus with other circulatory complications: Secondary | ICD-10-CM | POA: Diagnosis not present

## 2018-07-07 DIAGNOSIS — Z961 Presence of intraocular lens: Secondary | ICD-10-CM | POA: Diagnosis not present

## 2018-07-07 DIAGNOSIS — H26493 Other secondary cataract, bilateral: Secondary | ICD-10-CM | POA: Diagnosis not present

## 2018-09-16 DIAGNOSIS — E1129 Type 2 diabetes mellitus with other diabetic kidney complication: Secondary | ICD-10-CM | POA: Diagnosis not present

## 2018-09-23 DIAGNOSIS — E1129 Type 2 diabetes mellitus with other diabetic kidney complication: Secondary | ICD-10-CM | POA: Diagnosis not present

## 2018-09-23 DIAGNOSIS — I10 Essential (primary) hypertension: Secondary | ICD-10-CM | POA: Diagnosis not present

## 2018-09-23 DIAGNOSIS — E785 Hyperlipidemia, unspecified: Secondary | ICD-10-CM | POA: Diagnosis not present

## 2019-01-16 DIAGNOSIS — E1129 Type 2 diabetes mellitus with other diabetic kidney complication: Secondary | ICD-10-CM | POA: Diagnosis not present

## 2019-01-24 DIAGNOSIS — E1129 Type 2 diabetes mellitus with other diabetic kidney complication: Secondary | ICD-10-CM | POA: Diagnosis not present

## 2019-01-24 DIAGNOSIS — I10 Essential (primary) hypertension: Secondary | ICD-10-CM | POA: Diagnosis not present

## 2019-03-06 ENCOUNTER — Ambulatory Visit
Admission: EM | Admit: 2019-03-06 | Discharge: 2019-03-06 | Disposition: A | Discharge status: Home / Self Care | Payer: Medicare Other | Attending: Emergency Medicine | Admitting: Emergency Medicine

## 2019-03-06 ENCOUNTER — Other Ambulatory Visit: Payer: Self-pay

## 2019-03-06 DIAGNOSIS — M62838 Other muscle spasm: Secondary | ICD-10-CM

## 2019-03-06 DIAGNOSIS — R030 Elevated blood-pressure reading, without diagnosis of hypertension: Secondary | ICD-10-CM

## 2019-03-06 DIAGNOSIS — M542 Cervicalgia: Secondary | ICD-10-CM

## 2019-03-06 MED ORDER — PREDNISONE 20 MG PO TABS: 20.0000 mg | ORAL_TABLET | Freq: Two times a day (BID) | ORAL | 0 refills | Status: AC

## 2019-03-06 NOTE — Discharge Instructions (Addendum)
Continue conservative management of rest, ice, heat, and gentle stretches/ massage Prednisone prescribed.  Take as directed and to completion Follow up with PCP this week or next week to schedule an appointment to ensure symptoms are improving.   Return or go to the ER if you have any new or worsening symptoms (fever, chills, chest pain, abdominal pain, changes in bowel or bladder habits, pain radiating into lower legs, etc...)   Blood pressure elevated in office.  Please recheck in 24 hours.  If it continues to be greater than 140/90 please follow up with PCP for further evaluation and management.

## 2019-03-06 NOTE — ED Triage Notes (Signed)
Pt was moving a refrigerator on Saturday and now has neck pain.

## 2019-03-06 NOTE — ED Provider Notes (Signed)
[place]   [id number] 03/06/19 Arrival Time: 4098  CC: Neck pain  SUBJECTIVE: History from: patient. Chris Guerrero is a 78 y.o. male complains of right sided neck pain that began 2 days ago.  Symptoms began after helping to move a refrigerator.  Localizes the pain to the RT side of neck.  Describes the pain as intermittent and achy in character.  Has tried some left over medications without relief including robaxin and norco.  Symptoms are made worse with neck ROM.  Denies similar symptoms in the past.  Denies fever, chills, chest pain, SOB, erythema, ecchymosis, effusion, weakness, numbness and tingling.     Admits to chewing tobacco x years  ROS: As per HPI.  All other pertinent ROS negative.     Past Medical History:  Diagnosis Date  . High cholesterol   . Hyperlipemia   . Hypertension    Past Surgical History:  Procedure Laterality Date  . CHOLECYSTECTOMY    . COLONOSCOPY  09/04/2010   RMR: diverticula, cecal polyp = tubular adenoma; Repeat in 5 years  . COLONOSCOPY N/A 10/10/2015   Procedure: COLONOSCOPY;  Surgeon: Daneil Dolin, MD;  Location: [facility];  Service: Endoscopy;  Laterality: N/A;  1300  . HERNIA REPAIR     No Known Allergies No current facility-administered medications on file prior to encounter.    Current Outpatient Medications on File Prior to Encounter  Medication Sig Dispense Refill  . aspirin 81 MG tablet Take 81 mg by mouth daily.    [name] atorvastatin (LIPITOR) 10 MG tablet Take 10 mg by mouth daily.    [name] doxazosin (CARDURA) 8 MG tablet Take 4 mg by mouth at bedtime.     [name] HYDROcodone-acetaminophen (NORCO/VICODIN) 5-325 MG tablet Take 1-2 tablets by mouth every 6 (six) hours as needed. 10 tablet 0  . lisinopril (PRINIVIL,ZESTRIL) 40 MG tablet Take 40 mg by mouth daily.    . methocarbamol (ROBAXIN) 500 MG tablet Take 1 tablet (500 mg total) by mouth 3 (three) times daily. 21 tablet 0  . Multiple Vitamin (MULTIVITAMIN) tablet Take 1  tablet by mouth daily.    . polyethylene glycol-electrolytes (NULYTELY/GOLYTELY) 420 g solution Take 4,000 mLs by mouth once. 4000 mL 0  . ranitidine (ZANTAC) 150 MG capsule Take 150 mg by mouth daily.    . traZODone (DESYREL) 50 MG tablet Take 50 mg by mouth at bedtime.     Social History   Socioeconomic History  . Marital status: Divorced    Spouse name: Not on file  . Number of children: Not on file  . Years of education: Not on file  . Highest education level: Not on file  Occupational History  . Not on file  Social Needs  . Financial resource strain: Not on file  . Food insecurity    Worry: Not on file    Inability: Not on file  . Transportation needs    Medical: Not on file    Non-medical: Not on file  Tobacco Use  . Smoking status: Never Smoker  . Smokeless tobacco: Current User    Types: Chew  . Tobacco comment: rarely chews tobacco  Substance and Sexual Activity  . Alcohol use: Yes    Alcohol/week: 0.0 standard drinks    Comment: As often as once a day, as much as 2-3 per sitting.  . Drug use: No  . Sexual activity: Not on file  Lifestyle  . Physical activity    Days per week:  Not on file    Minutes per session: Not on file  . Stress: Not on file  Relationships  . Social Herbalist on phone: Not on file    Gets together: Not on file    Attends religious service: Not on file    Active member of club or organization: Not on file    Attends meetings of clubs or organizations: Not on file    Relationship status: Not on file  . Intimate partner violence    Fear of current or ex partner: Not on file    Emotionally abused: Not on file    Physically abused: Not on file    Forced sexual activity: Not on file  Other Topics Concern  . Not on file  Social History Narrative  . Not on file   Family History  Problem Relation Age of Onset  . Colon cancer Neg Hx     OBJECTIVE:  Vitals:   03/06/19 1626  BP: (!) 173/103  Pulse: 94  Resp: 20  Temp:  98.3 F (36.8 C)  SpO2: 95%    General appearance: ALERT; in no acute distress.  Head: NCAT ENT: PERRL, EOM I grossly; nares patent without rhinorrhea; oropharynx clear, tobacco residue around lips Lungs: Normal respiratory effort; CTAB CV: RRR; Radial pulses 2+ bilaterally. Cap refill < 2 seconds Musculoskeletal: Neck Inspection: Skin warm, dry, clear and intact without obvious erythema, effusion, or ecchymosis.  Palpation: TTP over the RT posterior neck with palpable spasm/ soft tissue swelling; no midline tenderness ROM: LROM about the neck Strength: 5/5 shld abduction, 5/5 shld adduction, 5/5 elbow flexion, 5/5 elbow extension, 5/5 grip strength Skin: warm and dry Neurologic: Ambulates without difficulty; Sensation intact about the upper extremities Psychological: alert and cooperative; normal mood and affect   ASSESSMENT & PLAN:  1. Neck pain   2. Neck muscle spasm   3. Elevated blood pressure reading    Meds ordered this encounter  Medications  . predniSONE (DELTASONE) 20 MG tablet    Sig: Take 1 tablet (20 mg total) by mouth 2 (two) times daily with a meal for 5 days.    Dispense:  10 tablet    Refill:  0    Order Specific Question:   Supervising Provider    Answer:   Raylene Everts [[id number]]    Continue conservative management of rest, ice, heat, and gentle stretches/ massage Prednisone prescribed.  Take as directed and to completion Follow up with PCP this week or next week to schedule an appointment to ensure symptoms are improving.   Return or go to the ER if you have any new or worsening symptoms (fever, chills, chest pain, abdominal pain, changes in bowel or bladder habits, pain radiating into lower legs, etc...)   Blood pressure elevated in office.  Please recheck in 24 hours.  If it continues to be greater than 140/90 please follow up with PCP for further evaluation and management.    Reviewed expectations re: course of current medical issues. Questions  answered. Outlined signs and symptoms indicating need for more acute intervention. Patient verbalized understanding. After Visit Summary given.    Lestine Box, [name] 03/06/19 1649

## 2019-03-17 DIAGNOSIS — Z23 Encounter for immunization: Secondary | ICD-10-CM | POA: Diagnosis not present

## 2019-03-22 DIAGNOSIS — I10 Essential (primary) hypertension: Secondary | ICD-10-CM | POA: Diagnosis not present

## 2019-03-22 DIAGNOSIS — R52 Pain, unspecified: Secondary | ICD-10-CM | POA: Diagnosis not present

## 2019-04-24 DIAGNOSIS — I491 Atrial premature depolarization: Secondary | ICD-10-CM | POA: Diagnosis not present

## 2019-04-24 DIAGNOSIS — I10 Essential (primary) hypertension: Secondary | ICD-10-CM | POA: Diagnosis not present

## 2019-05-26 DIAGNOSIS — E1129 Type 2 diabetes mellitus with other diabetic kidney complication: Secondary | ICD-10-CM | POA: Diagnosis not present

## 2019-06-02 DIAGNOSIS — R05 Cough: Secondary | ICD-10-CM | POA: Diagnosis not present

## 2019-06-02 DIAGNOSIS — I10 Essential (primary) hypertension: Secondary | ICD-10-CM | POA: Diagnosis not present

## 2019-06-02 DIAGNOSIS — E1129 Type 2 diabetes mellitus with other diabetic kidney complication: Secondary | ICD-10-CM | POA: Diagnosis not present

## 2019-07-03 DIAGNOSIS — R05 Cough: Secondary | ICD-10-CM | POA: Diagnosis not present

## 2019-07-03 DIAGNOSIS — I10 Essential (primary) hypertension: Secondary | ICD-10-CM | POA: Diagnosis not present

## 2019-08-01 ENCOUNTER — Other Ambulatory Visit: Payer: Self-pay

## 2019-08-01 ENCOUNTER — Inpatient Hospital Stay (HOSPITAL_COMMUNITY)
Admission: EM | Admit: 2019-08-01 | Discharge: 2019-08-09 | DRG: 286 | Disposition: A | Discharge status: Home Health | Payer: No Typology Code available for payment source | Attending: Cardiovascular Disease | Admitting: Cardiovascular Disease

## 2019-08-01 ENCOUNTER — Emergency Department (HOSPITAL_COMMUNITY): Payer: No Typology Code available for payment source

## 2019-08-01 ENCOUNTER — Encounter (HOSPITAL_COMMUNITY): Payer: Self-pay

## 2019-08-01 DIAGNOSIS — Z452 Encounter for adjustment and management of vascular access device: Secondary | ICD-10-CM

## 2019-08-01 DIAGNOSIS — N401 Enlarged prostate with lower urinary tract symptoms: Secondary | ICD-10-CM | POA: Diagnosis present

## 2019-08-01 DIAGNOSIS — I340 Nonrheumatic mitral (valve) insufficiency: Secondary | ICD-10-CM | POA: Diagnosis not present

## 2019-08-01 DIAGNOSIS — E785 Hyperlipidemia, unspecified: Secondary | ICD-10-CM

## 2019-08-01 DIAGNOSIS — R338 Other retention of urine: Secondary | ICD-10-CM | POA: Diagnosis not present

## 2019-08-01 DIAGNOSIS — Z20822 Contact with and (suspected) exposure to covid-19: Secondary | ICD-10-CM | POA: Diagnosis present

## 2019-08-01 DIAGNOSIS — Z79899 Other long term (current) drug therapy: Secondary | ICD-10-CM

## 2019-08-01 DIAGNOSIS — E7800 Pure hypercholesterolemia, unspecified: Secondary | ICD-10-CM | POA: Diagnosis present

## 2019-08-01 DIAGNOSIS — I5021 Acute systolic (congestive) heart failure: Secondary | ICD-10-CM | POA: Diagnosis not present

## 2019-08-01 DIAGNOSIS — I4891 Unspecified atrial fibrillation: Secondary | ICD-10-CM | POA: Diagnosis not present

## 2019-08-01 DIAGNOSIS — I493 Ventricular premature depolarization: Secondary | ICD-10-CM | POA: Diagnosis present

## 2019-08-01 DIAGNOSIS — I959 Hypotension, unspecified: Secondary | ICD-10-CM | POA: Diagnosis present

## 2019-08-01 DIAGNOSIS — R319 Hematuria, unspecified: Secondary | ICD-10-CM | POA: Diagnosis present

## 2019-08-01 DIAGNOSIS — R0600 Dyspnea, unspecified: Secondary | ICD-10-CM

## 2019-08-01 DIAGNOSIS — I5082 Biventricular heart failure: Secondary | ICD-10-CM | POA: Diagnosis present

## 2019-08-01 DIAGNOSIS — R9431 Abnormal electrocardiogram [ECG] [EKG]: Secondary | ICD-10-CM | POA: Diagnosis not present

## 2019-08-01 DIAGNOSIS — I428 Other cardiomyopathies: Secondary | ICD-10-CM | POA: Diagnosis present

## 2019-08-01 DIAGNOSIS — N1831 Chronic kidney disease, stage 3a: Secondary | ICD-10-CM | POA: Diagnosis present

## 2019-08-01 DIAGNOSIS — Z7982 Long term (current) use of aspirin: Secondary | ICD-10-CM

## 2019-08-01 DIAGNOSIS — I513 Intracardiac thrombosis, not elsewhere classified: Secondary | ICD-10-CM | POA: Diagnosis present

## 2019-08-01 DIAGNOSIS — I4819 Other persistent atrial fibrillation: Secondary | ICD-10-CM | POA: Diagnosis present

## 2019-08-01 DIAGNOSIS — N183 Chronic kidney disease, stage 3 unspecified: Secondary | ICD-10-CM | POA: Diagnosis not present

## 2019-08-01 DIAGNOSIS — I509 Heart failure, unspecified: Secondary | ICD-10-CM

## 2019-08-01 DIAGNOSIS — I5043 Acute on chronic combined systolic (congestive) and diastolic (congestive) heart failure: Secondary | ICD-10-CM | POA: Diagnosis present

## 2019-08-01 DIAGNOSIS — I5041 Acute combined systolic (congestive) and diastolic (congestive) heart failure: Secondary | ICD-10-CM | POA: Diagnosis not present

## 2019-08-01 DIAGNOSIS — R7989 Other specified abnormal findings of blood chemistry: Secondary | ICD-10-CM

## 2019-08-01 DIAGNOSIS — N179 Acute kidney failure, unspecified: Secondary | ICD-10-CM | POA: Diagnosis present

## 2019-08-01 DIAGNOSIS — I130 Hypertensive heart and chronic kidney disease with heart failure and stage 1 through stage 4 chronic kidney disease, or unspecified chronic kidney disease: Principal | ICD-10-CM | POA: Diagnosis present

## 2019-08-01 DIAGNOSIS — I10 Essential (primary) hypertension: Secondary | ICD-10-CM | POA: Diagnosis not present

## 2019-08-01 DIAGNOSIS — J810 Acute pulmonary edema: Secondary | ICD-10-CM

## 2019-08-01 DIAGNOSIS — F17220 Nicotine dependence, chewing tobacco, uncomplicated: Secondary | ICD-10-CM | POA: Diagnosis present

## 2019-08-01 DIAGNOSIS — I361 Nonrheumatic tricuspid (valve) insufficiency: Secondary | ICD-10-CM | POA: Diagnosis not present

## 2019-08-01 DIAGNOSIS — Z466 Encounter for fitting and adjustment of urinary device: Secondary | ICD-10-CM | POA: Diagnosis not present

## 2019-08-01 DIAGNOSIS — R609 Edema, unspecified: Secondary | ICD-10-CM

## 2019-08-01 DIAGNOSIS — R0602 Shortness of breath: Secondary | ICD-10-CM | POA: Diagnosis present

## 2019-08-01 DIAGNOSIS — Q211 Atrial septal defect: Secondary | ICD-10-CM | POA: Diagnosis not present

## 2019-08-01 DIAGNOSIS — I429 Cardiomyopathy, unspecified: Secondary | ICD-10-CM | POA: Diagnosis not present

## 2019-08-01 LAB — COMPREHENSIVE METABOLIC PANEL
ALT: 22 U/L (ref 0–44)
AST: 31 U/L (ref 15–41)
Albumin: 3.5 g/dL (ref 3.5–5.0)
Alkaline Phosphatase: 103 U/L (ref 38–126)
Anion gap: 11 (ref 5–15)
BUN: 26 mg/dL — ABNORMAL HIGH (ref 8–23)
CO2: 23 mmol/L (ref 22–32)
Calcium: 8.9 mg/dL (ref 8.9–10.3)
Chloride: 104 mmol/L (ref 98–111)
Creatinine, Ser: 1.45 mg/dL — ABNORMAL HIGH (ref 0.61–1.24)
GFR calc Af Amer: 52 mL/min — ABNORMAL LOW (ref >60)
GFR calc non Af Amer: 45 mL/min — ABNORMAL LOW (ref >60)
Glucose, Bld: 109 mg/dL — ABNORMAL HIGH (ref 70–99)
Potassium: 3.6 mmol/L (ref 3.5–5.1)
Sodium: 138 mmol/L (ref 135–145)
Total Bilirubin: 1.7 mg/dL — ABNORMAL HIGH (ref 0.3–1.2)
Total Protein: 6.5 g/dL (ref 6.5–8.1)

## 2019-08-01 LAB — URINALYSIS, ROUTINE W REFLEX MICROSCOPIC
Bilirubin Urine: NEGATIVE
Glucose, UA: NEGATIVE mg/dL
Hgb urine dipstick: NEGATIVE
Ketones, ur: NEGATIVE mg/dL
Leukocytes,Ua: NEGATIVE
Nitrite: NEGATIVE
Protein, ur: NEGATIVE mg/dL
Specific Gravity, Urine: 1.008 (ref 1.005–1.030)
pH: 5 (ref 5.0–8.0)

## 2019-08-01 LAB — CBC WITH DIFFERENTIAL/PLATELET
Abs Immature Granulocytes: 0.03 10*3/uL (ref 0.00–0.07)
Basophils Absolute: 0 10*3/uL (ref 0.0–0.1)
Basophils Relative: 1 %
Eosinophils Absolute: 0.1 10*3/uL (ref 0.0–0.5)
Eosinophils Relative: 2 %
HCT: 44.9 % (ref 39.0–52.0)
Hemoglobin: 14.4 g/dL (ref 13.0–17.0)
Immature Granulocytes: 1 %
Lymphocytes Relative: 23 %
Lymphs Abs: 1.5 10*3/uL (ref 0.7–4.0)
MCH: 29.6 pg (ref 26.0–34.0)
MCHC: 32.1 g/dL (ref 30.0–36.0)
MCV: 92.4 fL (ref 80.0–100.0)
Monocytes Absolute: 0.7 10*3/uL (ref 0.1–1.0)
Monocytes Relative: 11 %
Neutro Abs: 4.1 10*3/uL (ref 1.7–7.7)
Neutrophils Relative %: 62 %
Platelets: 157 10*3/uL (ref 150–400)
RBC: 4.86 MIL/uL (ref 4.22–5.81)
RDW: 14.6 % (ref 11.5–15.5)
WBC: 6.5 10*3/uL (ref 4.0–10.5)
nRBC: 0 % (ref 0.0–0.2)

## 2019-08-01 LAB — RESPIRATORY PANEL BY RT PCR (FLU A&B, COVID)
Influenza A by PCR: NEGATIVE
Influenza B by PCR: NEGATIVE
SARS Coronavirus 2 by RT PCR: NEGATIVE

## 2019-08-01 LAB — PROTIME-INR
INR: 1.1 (ref 0.8–1.2)
Prothrombin Time: 14 seconds (ref 11.4–15.2)

## 2019-08-01 LAB — TROPONIN I (HIGH SENSITIVITY)
Troponin I (High Sensitivity): 25 ng/L — ABNORMAL HIGH (ref <18)
Troponin I (High Sensitivity): 27 ng/L — ABNORMAL HIGH (ref <18)

## 2019-08-01 LAB — BRAIN NATRIURETIC PEPTIDE: B Natriuretic Peptide: 1068 pg/mL — ABNORMAL HIGH (ref 0.0–100.0)

## 2019-08-01 LAB — APTT: aPTT: 30 seconds (ref 24–36)

## 2019-08-01 MED ORDER — SODIUM CHLORIDE 0.9% FLUSH
3.0000 mL | Freq: Two times a day (BID) | INTRAVENOUS | Status: DC
  Administered 2019-08-02 – 2019-08-08 (×4): 3 mL via INTRAVENOUS

## 2019-08-01 MED ORDER — DILTIAZEM HCL-DEXTROSE 125-5 MG/125ML-% IV SOLN (PREMIX)
5.0000 mg/h | INTRAVENOUS | Status: DC
  Administered 2019-08-01: 5 mg/h via INTRAVENOUS
  Filled 2019-08-01: qty 125

## 2019-08-01 MED ORDER — HEPARIN (PORCINE) 25000 UT/250ML-% IV SOLN
1400.0000 [IU]/h | INTRAVENOUS | Status: DC
  Administered 2019-08-01: 19:00:00 1400 [IU]/h via INTRAVENOUS
  Filled 2019-08-01: qty 250

## 2019-08-01 MED ORDER — SODIUM CHLORIDE 0.9% FLUSH: 3.0000 mL | INTRAVENOUS | Status: DC | PRN

## 2019-08-01 MED ORDER — SODIUM CHLORIDE 0.9 % IV SOLN: 250.0000 mL | INTRAVENOUS | Status: DC | PRN

## 2019-08-01 MED ORDER — FUROSEMIDE 10 MG/ML IJ SOLN
40.0000 mg | Freq: Two times a day (BID) | INTRAMUSCULAR | Status: DC
  Administered 2019-08-02 (×2): 40 mg via INTRAVENOUS
  Filled 2019-08-01 (×2): qty 4

## 2019-08-01 MED ORDER — AMIODARONE HCL IN DEXTROSE 360-4.14 MG/200ML-% IV SOLN
30.0000 mg/h | INTRAVENOUS | Status: DC
  Filled 2019-08-01 (×4): qty 200

## 2019-08-01 MED ORDER — LISINOPRIL 10 MG PO TABS: 40.0000 mg | ORAL_TABLET | Freq: Every day | ORAL | Status: DC

## 2019-08-01 MED ORDER — HEPARIN BOLUS VIA INFUSION
4000.0000 [IU] | Freq: Once | INTRAVENOUS | Status: AC
  Administered 2019-08-01: 18:00:00 4000 [IU] via INTRAVENOUS

## 2019-08-01 MED ORDER — ACETAMINOPHEN 325 MG PO TABS
650.0000 mg | ORAL_TABLET | ORAL | Status: DC | PRN
  Administered 2019-08-04 – 2019-08-07 (×5): 650 mg via ORAL
  Filled 2019-08-01 (×4): qty 2

## 2019-08-01 MED ORDER — AMIODARONE HCL IN DEXTROSE 360-4.14 MG/200ML-% IV SOLN
60.0000 mg/h | INTRAVENOUS | Status: DC
  Administered 2019-08-01: 20:00:00 60 mg/h via INTRAVENOUS

## 2019-08-01 MED ORDER — FUROSEMIDE 10 MG/ML IJ SOLN
40.0000 mg | Freq: Once | INTRAMUSCULAR | Status: AC
  Administered 2019-08-01: 40 mg via INTRAVENOUS
  Filled 2019-08-01: qty 4

## 2019-08-01 MED ORDER — ONDANSETRON HCL 4 MG/2ML IJ SOLN: 4.0000 mg | Freq: Four times a day (QID) | INTRAMUSCULAR | Status: DC | PRN

## 2019-08-01 MED ORDER — ASPIRIN EC 81 MG PO TBEC
81.0000 mg | DELAYED_RELEASE_TABLET | Freq: Every day | ORAL | Status: DC
  Administered 2019-08-01 – 2019-08-02 (×2): 81 mg via ORAL
  Filled 2019-08-01 (×2): qty 1

## 2019-08-01 NOTE — H&P (Signed)
History and Physical  Chris Guerrero:[id number] DOB: 1940/10/10 DOA: 08/01/2019  Referring physician: [name] PCP: Chris Noble, MD  Patient coming from: Home  Chief Complaint: Increased leg swelling and progressive shortness of breath  HPI: Chris Guerrero is an 79 y.o. male with medical history significant for hypertension and hyperlipidemia who presents to the emergency department from Dr. Josephine Guerrero office due to progressive shortness of breath and increased leg swelling that has been ongoing for 3-4 weeks.  Patient complained of progressive increased leg swelling, abdominal swelling with tightness and slowly progressive shortness of breath which worsens with exertion and improves with rest.  He scheduled an outpatient appointment with his PCP today and patient was found to be in A. fib with RVR and was also suspected to be in CHF, so he was sent to the ED for further evaluation and management.  Patient denies chest pain, shortness of breath, fever, chills, diaphoresis.  ED Course:  In the emergency department, patient was tachycardic and tachypneic, but other vital signs were stable.  Work-up in the ED showed normal CBC and BMP except for elevated BUN/creatinine 26/1.45, total bilirubin 1.7.  High-sensitivity troponin I 25, BNP 1068.  Chest x-ray showed cardiac enlargement with small pleural effusion bilaterally possibly mild fluid overload, hypoventilation with bibasilar atelectasis noted.  He was noted to be in A. fib with RVR, patient was started on IV heparin drip, IV Lasix 40 mg x 1 was given and he was initially started on IV Cardizem drip however this was changed to IV amiodarone due to soft BP.  Hospitalist was asked to admit him for further evaluation and management.  Review of Systems: Constitutional: Negative for chills and fever.  HENT: Negative for ear pain and sore throat.   Eyes: Negative for pain and visual disturbance.  Respiratory: Positive for shortness of breath  negative for cough, chest tightness Cardiovascular: Negative for chest pain and palpitations.  Gastrointestinal: Increased abdominal swelling and tightness.  Negative for abdominal vomiting.  Endocrine: Negative for polyphagia and polyuria.  Genitourinary: Negative for decreased urine volume, dysuria, enuresis Musculoskeletal: Negative for arthralgias and back pain.  Extremities: Positive for bilateral leg swelling Skin: Negative for color change and rash.  Allergic/Immunologic: Negative for immunocompromised state.  Neurological: Negative for tremors, syncope, speech difficulty, weakness, light-headedness and headaches.  Hematological: Does not bruise/bleed easily.    Past Medical History:  Diagnosis Date  . High cholesterol   . Hyperlipemia   . Hypertension    Past Surgical History:  Procedure Laterality Date  . CHOLECYSTECTOMY    . COLONOSCOPY  09/04/2010   RMR: diverticula, cecal polyp = tubular adenoma; Repeat in 5 years  . COLONOSCOPY N/A 10/10/2015   Procedure: COLONOSCOPY;  Surgeon: Daneil Dolin, MD;  Location: [facility];  Service: Endoscopy;  Laterality: N/A;  1300  . HERNIA REPAIR      Social History:  reports that he has never smoked. His smokeless tobacco use includes chew. He reports previous alcohol use. He reports that he does not use drugs.   No Known Allergies  Family History  Problem Relation Age of Onset  . Colon cancer Neg Hx       Prior to Admission medications   Medication Sig Start Date End Date Taking? Authorizing Provider  aspirin 81 MG tablet Take 81 mg by mouth daily.    [provider]  atorvastatin (LIPITOR) 10 MG tablet Take 10 mg by mouth daily.    [provider]  doxazosin (  CARDURA) 8 MG tablet Take 4 mg by mouth at bedtime.     [provider]  HYDROcodone-acetaminophen (NORCO/VICODIN) 5-325 MG tablet Take 1-2 tablets by mouth every 6 (six) hours as needed. 05/09/18   [name], MD  lisinopril  (PRINIVIL,ZESTRIL) 40 MG tablet Take 40 mg by mouth daily.    [provider]  methocarbamol (ROBAXIN) 500 MG tablet Take 1 tablet (500 mg total) by mouth 3 (three) times daily. 02/06/16   Triplett, Tammy, [name]  Multiple Vitamin (MULTIVITAMIN) tablet Take 1 tablet by mouth daily.    [provider]  polyethylene glycol-electrolytes (NULYTELY/GOLYTELY) 420 g solution Take 4,000 mLs by mouth once. 10/03/15   [name], NP  ranitidine (ZANTAC) 150 MG capsule Take 150 mg by mouth daily.    [provider]  traZODone (DESYREL) 50 MG tablet Take 50 mg by mouth at bedtime.    [provider]    Physical Exam: BP 100/79   Pulse 80   Temp 97.9 F (36.6 C) (Oral)   Resp (!) 22   Ht 5\' 9"  (1.753 m)   Wt 98.4 kg   SpO2 93%   BMI 32.05 kg/m   . General: 79 y.o. year-old male well developed well nourished in no acute distress.  Alert and oriented x3. [name] HEENT: Normocephalic, atraumatic . Neck supple, trachea medial . Cardiovascular: Irregularly irregular heart rhythm with no rubs or gallops.  No thyromegaly noted.  Bilateral +2 lower extremity edema. 2/4 pulses in all 4 extremities. [name] Respiratory: Decreased breath sounds bilaterally with some rales in lower lobes on auscultation.  No accessory muscle use.   . Abdomen: Nontender though mildly distended with normal bowel sounds x4 quadrants. . Muskuloskeletal: No cyanosis, clubbing or edema noted bilaterally . Neuro: CN II-XII intact, strength, sensation, reflexes . Skin: No ulcerative lesions noted or rashes . Psychiatry: Judgement and insight appear normal. Mood is appropriate for condition and setting          Labs on Admission:  Basic Metabolic Panel: Recent Labs  Lab 08/01/19 1721  NA 138  K 3.6  CL 104  CO2 23  GLUCOSE 109*  BUN 26*  CREATININE 1.45*  CALCIUM 8.9   Liver Function Tests: Recent Labs  Lab 08/01/19 1721  AST 31  ALT 22  ALKPHOS 103  BILITOT 1.7*  PROT 6.5  ALBUMIN 3.5    No results for input(s): LIPASE, AMYLASE in the last 168 hours. No results for input(s): AMMONIA in the last 168 hours. CBC: Recent Labs  Lab 08/01/19 1721  WBC 6.5  NEUTROABS 4.1  HGB 14.4  HCT 44.9  MCV 92.4  PLT 157   Cardiac Enzymes: No results for input(s): CKTOTAL, CKMB, CKMBINDEX, TROPONINI in the last 168 hours.  BNP (last 3 results) Recent Labs    08/01/19 1721  BNP 1,068.0*    ProBNP (last 3 results) No results for input(s): PROBNP in the last 8760 hours.  CBG: No results for input(s): GLUCAP in the last 168 hours.  Radiological Exams on Admission: DG Chest Portable 1 View  Result Date: 08/01/2019 CLINICAL DATA:  Short of breath EXAM: PORTABLE CHEST 1 VIEW COMPARISON:  05/09/2018 FINDINGS: Hypoventilation. Decreased lung volume with bibasilar atelectasis and small pleural effusions. Cardiac enlargement with mild vascular congestion. Atherosclerotic aortic arch. IMPRESSION: Cardiac enlargement with small pleural effusions bilaterally. Possible mild fluid overload Hypoventilation with bibasilar atelectasis. Electronically Signed   By: Franchot Gallo M.D.   On: 08/01/2019 18:10    EKG:  I independently viewed the EKG done and my findings are as followed: A. fib with RVR with prolonged QTc (557ms)  Assessment/Plan Present on Admission: **None**  Principal Problem:   CHF (congestive heart failure) (HCC) Active Problems:   Atrial fibrillation with RVR (HCC)   Peripheral edema   Prolonged QT interval   Elevated brain natriuretic peptide (BNP) level   Essential hypertension   Hyperlipidemia   Elevated serum creatinine  New onset CHF Elevated BNP possibly secondary to above Chest x-ray showed cardiac enlargement with small pleural effusion bilaterally possibly mild fluid overload Patient complains of 3 to 4 weeks onset of bilateral increased leg swelling, abdominal swelling with increased tightness and shortness of breath on exertion which improves with rest.  BNP was elevated at 1068 Continue total input/output, daily weights and fluid restriction Continue IV Lasix 40 mg twice daily Continue Cardiac diet  Echocardiogram in the morning  Cardiology consult in the morning  Paroxysmal A. fib with RVR This was possibly precipitated by new onset CHF Patient was initially started on IV Cardizem drip, but this was changed to IV Amiodarone due to soft BP, continue IV amiodarone at this time with plan to transition to p.o. meds once rate is better controlled. Continue with IV heparin drip at this time with plan to transition to DOAC in the morning if patient continues to be in A. Fib.  Elevated troponin possibly secondary to above Troponin x 1 25, patient denies chest pain Continue telemetry and continue to trend troponin  Essential hypertension Continue IV Lasix and lisinopril  Hyperlipidemia Continue statin  Prolonged QTc QTc= 500 Avoid QT prolonging drugs Magnesium level will be checked Repeat EKG in the morning  Elevated serum creatinine Creatinine on admission= 1.25, no prior labs for comparison    Renally adjust medications, avoid nephrotoxic agents/dehydration/hypotension   DVT prophylaxis: IV heparin drip  Code Status: Full code  Family Communication: None at bedside  Disposition Plan: Home once clinically stable  Consults called: Cardiology  Admission status: Inpatient    Bernadette Hoit MD [facility] Pager [phone]  If 7PM-7AM, please contact night-coverage [email or web address] Password [facility]  08/01/2019, 8:20 PM

## 2019-08-01 NOTE — Progress Notes (Signed)
ANTICOAGULATION CONSULT NOTE - Initial Consult  Pharmacy Consult for Heparin Indication: atrial fibrillation  No Known Allergies  Patient Measurements: Height: 5\' 9"  (175.3 cm) Weight: 217 lb (98.4 kg) IBW/kg (Calculated) : 70.7 Heparin Dosing Weight: 91 kg  Vital Signs: Temp: 97.9 F (36.6 C) (02/16 1656) Temp Source: Oral (02/16 1656) BP: 107/81 (02/16 1730) Pulse Rate: 79 (02/16 1730)  Labs: Recent Labs    08/01/19 1721  HGB 14.4  HCT 44.9  PLT 157  CREATININE 1.45*  TROPONINIHS 25*    Estimated Creatinine Clearance: 47 mL/min (A) (by C-G formula based on SCr of 1.45 mg/dL (H)).   Medical History: Past Medical History:  Diagnosis Date  . High cholesterol   . Hyperlipemia   . Hypertension     Medications:  No oral anticoagulation PTA  Assessment:  78 yr sent from PCP to ED with diagnosis of new onset AFib.  PMH significant for HTN and HLD.  Pharmacy consulted to dose IV heparin  Goal of Therapy:  Heparin level 0.3-0.7 units/ml Monitor platelets by anticoagulation protocol: Yes   Plan:   Obtain baseline aPTT and PT/INR  Heparin 4000 unit IV bolus x 1 followed by heparin gtt @ 1400 units/hr  Check heparin level 8 hr after heparin gtt started  Follow daily heparin level and CBC while on heparin gtt  Everette Rank, PharmD 08/01/2019,6:05 PM

## 2019-08-01 NOTE — ED Provider Notes (Signed)
Emergency Department Provider Note   I have reviewed the triage vital signs and the nursing notes.   HISTORY  Chief Complaint Shortness of Breath and Leg Swelling   HPI Chris Guerrero is a 79 y.o. male with PMH of HLD and HTN presents to the emergency department from Dr. [name] office with increased leg swelling, progressive shortness of breath and new atrial fibrillation.  The patient does not carry a known diagnosis of A. fib or congestive heart failure.  Patient denies history of prior heart attack or stroke.  He states that over the past 30 days he has felt increasingly short of breath especially with exertion.  He denies fevers or chills.  He scheduled an outpatient appointment to discuss this with his PCP who found him to be in A. fib with RVR and sent him to the emergency department.  Patient does not having palpitations symptoms or chest pain currently.  His shortness of breath improves with rest and is worse with exertion.  No radiation of symptoms or other modifying factors.   Past Medical History:  Diagnosis Date  . High cholesterol   . Hyperlipemia   . Hypertension     Patient Active Problem List   Diagnosis Date Noted  . History of colonic polyps   . Diverticulosis of colon without hemorrhage   . History of adenomatous polyp of colon 10/03/2015    Past Surgical History:  Procedure Laterality Date  . CHOLECYSTECTOMY    . COLONOSCOPY  09/04/2010   RMR: diverticula, cecal polyp = tubular adenoma; Repeat in 5 years  . COLONOSCOPY N/A 10/10/2015   Procedure: COLONOSCOPY;  Surgeon: Daneil Dolin, MD;  Location: [facility];  Service: Endoscopy;  Laterality: N/A;  1300  . HERNIA REPAIR      Allergies Patient has no known allergies.  Family History  Problem Relation Age of Onset  . Colon cancer Neg Hx     Social History Social History   Tobacco Use  . Smoking status: Never Smoker  . Smokeless tobacco: Current User    Types: Chew  . Tobacco comment:  rarely chews tobacco  Substance Use Topics  . Alcohol use: Not Currently    Alcohol/week: 0.0 standard drinks    Comment: pt says drank "pretty good" up until 2 or 3 months ago.  . Drug use: No    Review of Systems  Constitutional: No fever/chills Eyes: No visual changes. ENT: No sore throat. Cardiovascular: Denies chest pain. Positive bilateral LE swelling. Respiratory: Positive shortness of breath. Gastrointestinal: No abdominal pain.  No nausea, no vomiting.  No diarrhea.  No constipation. Genitourinary: Negative for dysuria. Musculoskeletal: Negative for back pain. Skin: Negative for rash. Neurological: Negative for headaches, focal weakness or numbness.  10-point ROS otherwise negative.  ____________________________________________   PHYSICAL EXAM:  VITAL SIGNS: ED Triage Vitals  Enc Vitals Group     BP 08/01/19 1656 134/76     Pulse Rate 08/01/19 1656 (!) 131     Resp 08/01/19 1656 20     Temp 08/01/19 1656 97.9 F (36.6 C)     Temp Source 08/01/19 1656 Oral     SpO2 08/01/19 1656 94 %     Weight 08/01/19 1653 217 lb (98.4 kg)     Height 08/01/19 1653 5\' 9"  (1.753 m)   Constitutional: Alert and oriented. Well appearing and in no acute distress. Eyes: Conjunctivae are normal.  Head: Atraumatic. Nose: No congestion/rhinnorhea. Mouth/Throat: Mucous membranes are moist.   Neck:  No stridor.   Cardiovascular: A-fib with RVR. Good peripheral circulation. Grossly normal heart sounds.   Respiratory: Normal respiratory effort.  No retractions. Lungs CTAB. Gastrointestinal: Soft and nontender. No distention.  Musculoskeletal: No lower extremity tenderness with 3+ pitting edema bilaterally. No gross deformities of extremities. Neurologic:  Normal speech and language. No gross focal neurologic deficits are appreciated.  Skin:  Skin is warm, dry and intact. No rash noted.  ____________________________________________   LABS (all labs ordered are listed, but only  abnormal results are displayed)  Labs Reviewed  COMPREHENSIVE METABOLIC PANEL - Abnormal; Notable for the following components:      Result Value   Glucose, Bld 109 (*)    BUN 26 (*)    Creatinine, Ser 1.45 (*)    Total Bilirubin 1.7 (*)    GFR calc non Af Amer 45 (*)    GFR calc Af Amer 52 (*)    All other components within normal limits  BRAIN NATRIURETIC PEPTIDE - Abnormal; Notable for the following components:   B Natriuretic Peptide 1,068.0 (*)    All other components within normal limits  TROPONIN I (HIGH SENSITIVITY) - Abnormal; Notable for the following components:   Troponin I (High Sensitivity) 25 (*)    All other components within normal limits  RESPIRATORY PANEL BY RT PCR (FLU A&B, COVID)  CBC WITH DIFFERENTIAL/PLATELET  APTT  PROTIME-INR  URINALYSIS, ROUTINE W REFLEX MICROSCOPIC  CBC  HEPARIN LEVEL (UNFRACTIONATED)  TROPONIN I (HIGH SENSITIVITY)   ____________________________________________  EKG   EKG Interpretation  Date/Time:  Tuesday August 01 2019 16:54:02 EST Ventricular Rate:  128 PR Interval:    QRS Duration: 79 QT Interval:  342 QTC Calculation: 500 R Axis:   102 Text Interpretation: Atrial fibrillation Paired ventricular premature complexes Probable lateral infarct, age indeterminate No STEMI Confirmed by Nanda Quinton [phone]) on 08/01/2019 4:56:49 PM       ____________________________________________  RADIOLOGY  DG Chest Portable 1 View  Result Date: 08/01/2019 CLINICAL DATA:  Short of breath EXAM: PORTABLE CHEST 1 VIEW COMPARISON:  05/09/2018 FINDINGS: Hypoventilation. Decreased lung volume with bibasilar atelectasis and small pleural effusions. Cardiac enlargement with mild vascular congestion. Atherosclerotic aortic arch. IMPRESSION: Cardiac enlargement with small pleural effusions bilaterally. Possible mild fluid overload Hypoventilation with bibasilar atelectasis. Electronically Signed   By: Franchot Gallo M.D.   On: 08/01/2019 18:10     ____________________________________________   PROCEDURES  Procedure(s) performed:   Procedures  CRITICAL CARE Performed by: Margette Fast Total critical care time: 35 minutes Critical care time was exclusive of separately billable procedures and treating other patients. Critical care was necessary to treat or prevent imminent or life-threatening deterioration. Critical care was time spent personally by me on the following activities: development of treatment plan with patient and/or surrogate as well as nursing, discussions with consultants, evaluation of patient's response to treatment, examination of patient, obtaining history from patient or surrogate, ordering and performing treatments and interventions, ordering and review of laboratory studies, ordering and review of radiographic studies, pulse oximetry and re-evaluation of patient's condition.  Nanda Quinton, MD Emergency Medicine  ____________________________________________   INITIAL IMPRESSION / ASSESSMENT AND PLAN / ED COURSE  Pertinent labs & imaging results that were available during my care of the patient were reviewed by me and considered in my medical decision making (see chart for details).   Patient presents emergency department for evaluation of shortness of breath on exertion.  Found to be in atrial fibrillation with RVR.  Patient with  CHA2DS2-VASc score of 3.  Will start heparin.  Patient has no contraindications to anticoagulation.  No active chest pain.  No apparent ischemia on EKG.  Labs are pending along with chest x-ray.  Anticipate admit for new CHF and A. fib RVR.  07:22 PM  Labs consistent with CHF diagnosis with BNP greater than 1000, mild elevated troponin, chest x-ray showing mild edema.  Starting Lasix and continuing diltiazem infusion for A. fib RVR.  Plan for admit. Heparin started as well.   Discussed patient's case with [facility] to request admission. Patient and family (if present) updated with plan.  Care transferred to [facility] service.  I reviewed all nursing notes, vitals, pertinent old records, EKGs, labs, imaging (as available).  ____________________________________________  FINAL CLINICAL IMPRESSION(S) / ED DIAGNOSES  Final diagnoses:  Atrial fibrillation with RVR (HCC)  Peripheral edema  Acute pulmonary edema (HCC)    MEDICATIONS GIVEN DURING THIS VISIT:  Medications  diltiazem (CARDIZEM) 125 mg in dextrose 5% 125 mL (1 mg/mL) infusion (7.5 mg/hr Intravenous Rate/Dose Verify 08/01/19 1831)  heparin bolus via infusion 4,000 Units (4,000 Units Intravenous Bolus from Bag 08/01/19 1829)    Followed by  heparin ADULT infusion 100 units/mL (25000 units/219mL sodium chloride 0.45%) (1,400 Units/hr Intravenous Rate/Dose Verify 08/01/19 1837)  furosemide (LASIX) injection 40 mg (40 mg Intravenous Given 08/01/19 1853)    Note:  This document was prepared using Dragon voice recognition software and may include unintentional dictation errors.  Nanda Quinton, MD, [facility] Emergency Medicine    Wendy Mikles, [facility], MD 08/01/19 [place]

## 2019-08-01 NOTE — Progress Notes (Signed)
  Amiodarone Drug - Drug Interaction Consult Note  Recommendations: Pt is on low dose atorvastatin at home. Consider monitoring for rhabdo  Amiodarone is metabolized by the cytochrome P450 system and therefore has the potential to cause many drug interactions. Amiodarone has an average plasma half-life of 50 days (range 20 to 100 days).   There is potential for drug interactions to occur several weeks or months after stopping treatment and the onset of drug interactions may be slow after initiating amiodarone.   [x]  Statins: Increased risk of myopathy. Simvastatin- restrict dose to 20mg  daily. Other statins: counsel patients to report any muscle pain or weakness immediately.  []  Anticoagulants: Amiodarone can increase anticoagulant effect. Consider warfarin dose reduction. Patients should be monitored closely and the dose of anticoagulant altered accordingly, remembering that amiodarone levels take several weeks to stabilize.  []  Antiepileptics: Amiodarone can increase plasma concentration of phenytoin, the dose should be reduced. Note that small changes in phenytoin dose can result in large changes in levels. Monitor patient and counsel on signs of toxicity.  []  Beta blockers: increased risk of bradycardia, AV block and myocardial depression. Sotalol - avoid concomitant use.  []   Calcium channel blockers (diltiazem and verapamil): increased risk of bradycardia, AV block and myocardial depression.  []   Cyclosporine: Amiodarone increases levels of cyclosporine. Reduced dose of cyclosporine is recommended.  []  Digoxin dose should be halved when amiodarone is started.  []  Diuretics: increased risk of cardiotoxicity if hypokalemia occurs.  []  Oral hypoglycemic agents (glyburide, glipizide, glimepiride): increased risk of hypoglycemia. Patient's glucose levels should be monitored closely when initiating amiodarone therapy.   []  Drugs that prolong the QT interval:  Torsades de pointes risk may  be increased with concurrent use - avoid if possible.  Monitor QTc, also keep magnesium/potassium WNL if concurrent therapy can't be avoided. [name] Antibiotics: e.g. fluoroquinolones, erythromycin. . Antiarrhythmics: e.g. quinidine, procainamide, disopyramide, sotalol. . Antipsychotics: e.g. phenothiazines, haloperidol.  . Lithium, tricyclic antidepressants, and methadone.

## 2019-08-01 NOTE — ED Triage Notes (Signed)
Pt reports feet and leg swelling x 1 1/2 weeks.  Reports sob and swelling in abd also.  Dr. Willey Blade sent pt to er for eval of new onset afib and chf.

## 2019-08-01 NOTE — ED Notes (Signed)
Confirmed with pharmacy that Cardizem and heparin gtt are compatible

## 2019-08-02 ENCOUNTER — Inpatient Hospital Stay (HOSPITAL_COMMUNITY): Payer: No Typology Code available for payment source

## 2019-08-02 ENCOUNTER — Encounter (HOSPITAL_COMMUNITY): Payer: Self-pay | Admitting: Internal Medicine

## 2019-08-02 DIAGNOSIS — N183 Chronic kidney disease, stage 3 unspecified: Secondary | ICD-10-CM

## 2019-08-02 DIAGNOSIS — I10 Essential (primary) hypertension: Secondary | ICD-10-CM

## 2019-08-02 DIAGNOSIS — I361 Nonrheumatic tricuspid (valve) insufficiency: Secondary | ICD-10-CM

## 2019-08-02 DIAGNOSIS — I509 Heart failure, unspecified: Secondary | ICD-10-CM

## 2019-08-02 DIAGNOSIS — J810 Acute pulmonary edema: Secondary | ICD-10-CM

## 2019-08-02 DIAGNOSIS — I4891 Unspecified atrial fibrillation: Secondary | ICD-10-CM

## 2019-08-02 DIAGNOSIS — I340 Nonrheumatic mitral (valve) insufficiency: Secondary | ICD-10-CM

## 2019-08-02 LAB — BASIC METABOLIC PANEL
Anion gap: 10 (ref 5–15)
BUN: 25 mg/dL — ABNORMAL HIGH (ref 8–23)
CO2: 24 mmol/L (ref 22–32)
Calcium: 8.8 mg/dL — ABNORMAL LOW (ref 8.9–10.3)
Chloride: 102 mmol/L (ref 98–111)
Creatinine, Ser: 1.52 mg/dL — ABNORMAL HIGH (ref 0.61–1.24)
GFR calc Af Amer: 49 mL/min — ABNORMAL LOW (ref >60)
GFR calc non Af Amer: 43 mL/min — ABNORMAL LOW (ref >60)
Glucose, Bld: 124 mg/dL — ABNORMAL HIGH (ref 70–99)
Potassium: 3.6 mmol/L (ref 3.5–5.1)
Sodium: 136 mmol/L (ref 135–145)

## 2019-08-02 LAB — COMPREHENSIVE METABOLIC PANEL
ALT: 23 U/L (ref 0–44)
AST: 30 U/L (ref 15–41)
Albumin: 3.5 g/dL (ref 3.5–5.0)
Alkaline Phosphatase: 99 U/L (ref 38–126)
Anion gap: 11 (ref 5–15)
BUN: 27 mg/dL — ABNORMAL HIGH (ref 8–23)
CO2: 22 mmol/L (ref 22–32)
Calcium: 8.8 mg/dL — ABNORMAL LOW (ref 8.9–10.3)
Chloride: 103 mmol/L (ref 98–111)
Creatinine, Ser: 1.52 mg/dL — ABNORMAL HIGH (ref 0.61–1.24)
GFR calc Af Amer: 49 mL/min — ABNORMAL LOW (ref >60)
GFR calc non Af Amer: 43 mL/min — ABNORMAL LOW (ref >60)
Glucose, Bld: 161 mg/dL — ABNORMAL HIGH (ref 70–99)
Potassium: 3.8 mmol/L (ref 3.5–5.1)
Sodium: 136 mmol/L (ref 135–145)
Total Bilirubin: 1.4 mg/dL — ABNORMAL HIGH (ref 0.3–1.2)
Total Protein: 6.3 g/dL — ABNORMAL LOW (ref 6.5–8.1)

## 2019-08-02 LAB — MAGNESIUM: Magnesium: 1.8 mg/dL (ref 1.7–2.4)

## 2019-08-02 LAB — CBC
HCT: 44.1 % (ref 39.0–52.0)
Hemoglobin: 14.1 g/dL (ref 13.0–17.0)
MCH: 29.7 pg (ref 26.0–34.0)
MCHC: 32 g/dL (ref 30.0–36.0)
MCV: 93 fL (ref 80.0–100.0)
Platelets: 152 10*3/uL (ref 150–400)
RBC: 4.74 MIL/uL (ref 4.22–5.81)
RDW: 14.6 % (ref 11.5–15.5)
WBC: 5.8 10*3/uL (ref 4.0–10.5)
nRBC: 0 % (ref 0.0–0.2)

## 2019-08-02 LAB — HEPARIN LEVEL (UNFRACTIONATED)
Heparin Unfractionated: 1.25 IU/mL — ABNORMAL HIGH (ref 0.30–0.70)
Heparin Unfractionated: 0.93 IU/mL — ABNORMAL HIGH (ref 0.30–0.70)

## 2019-08-02 LAB — ECHOCARDIOGRAM COMPLETE
Height: 69 in
Weight: 3650.82 oz

## 2019-08-02 LAB — PHOSPHORUS: Phosphorus: 4 mg/dL (ref 2.5–4.6)

## 2019-08-02 MED ORDER — ATORVASTATIN CALCIUM 10 MG PO TABS
10.0000 mg | ORAL_TABLET | Freq: Every evening | ORAL | Status: DC
  Administered 2019-08-02 – 2019-08-08 (×7): 10 mg via ORAL
  Filled 2019-08-02 (×7): qty 1

## 2019-08-02 MED ORDER — CHLORHEXIDINE GLUCONATE CLOTH 2 % EX PADS
6.0000 | MEDICATED_PAD | Freq: Every day | CUTANEOUS | Status: DC
  Administered 2019-08-02 – 2019-08-09 (×4): 6 via TOPICAL

## 2019-08-02 MED ORDER — METOPROLOL TARTRATE 25 MG PO TABS
12.5000 mg | ORAL_TABLET | Freq: Three times a day (TID) | ORAL | Status: DC
  Administered 2019-08-02: 10:00:00 12.5 mg via ORAL
  Filled 2019-08-02 (×2): qty 1

## 2019-08-02 MED ORDER — METOPROLOL TARTRATE 25 MG PO TABS
12.5000 mg | ORAL_TABLET | Freq: Three times a day (TID) | ORAL | Status: DC
  Administered 2019-08-02: 12.5 mg via ORAL

## 2019-08-02 MED ORDER — AMIODARONE HCL IN DEXTROSE 360-4.14 MG/200ML-% IV SOLN
30.0000 mg/h | INTRAVENOUS | Status: DC
  Administered 2019-08-03 – 2019-08-08 (×9): 30 mg/h via INTRAVENOUS
  Filled 2019-08-02 (×12): qty 200

## 2019-08-02 MED ORDER — AMIODARONE HCL IN DEXTROSE 360-4.14 MG/200ML-% IV SOLN
60.0000 mg/h | INTRAVENOUS | Status: AC
  Administered 2019-08-02: 60 mg/h via INTRAVENOUS
  Filled 2019-08-02 (×2): qty 200

## 2019-08-02 MED ORDER — HEPARIN (PORCINE) 25000 UT/250ML-% IV SOLN
950.0000 [IU]/h | INTRAVENOUS | Status: DC
  Administered 2019-08-02: 12:00:00 1200 [IU]/h via INTRAVENOUS
  Administered 2019-08-03: 19:00:00 900 [IU]/h via INTRAVENOUS
  Filled 2019-08-02 (×2): qty 250

## 2019-08-02 NOTE — TOC Initial Note (Signed)
Transition of Care [facility]) - Initial/Assessment Note    Patient Details  Name: Chris Guerrero MRN: [id number] Date of Birth: [birth year]  [facility] CM/SW Contact:    Sherie Don, LCSW Phone Number: 08/02/2019, 12:07 PM  Clinical Narrative: [facility] completed. TOC received consult for newly diagnosed congestive heart failure. Completed initial TOC assessment. Patient reported he was unfamiliar with diet for CHF as he primarily eats out and occasionally cooks for himself. RN ordered consult with dietitian.   Expected Discharge Plan: Home/Self Care Barriers to Discharge: Continued Medical Work up  Patient Goals and CMS Choice    Expected Discharge Plan and Services Expected Discharge Plan: Home/Self Care In-house Referral: Nutrition     Living arrangements for the past 2 months: Mobile Home                   Prior Living Arrangements/Services Living arrangements for the past 2 months: Mobile Home Lives with:: Self Patient language and need for interpreter reviewed:: Yes Do you feel safe going back to the place where you live?: Yes      Need for Family Participation in Patient Care: No (Comment) Care giver support system in place?: Yes (comment)   Criminal Activity/Legal Involvement Pertinent to Current Situation/Hospitalization: No - Comment as needed  Activities of Daily Living Home Assistive Devices/Equipment: None ADL Screening (condition at time of admission) Patient's cognitive ability adequate to safely complete daily activities?: Yes Is the patient deaf or have difficulty hearing?: No Does the patient have difficulty seeing, even when wearing glasses/contacts?: No Does the patient have difficulty concentrating, remembering, or making decisions?: No Patient able to express need for assistance with ADLs?: Yes Does the patient have difficulty dressing or bathing?: No Independently performs ADLs?: No Does the patient have difficulty  walking or climbing stairs?: No Weakness of Legs: Both Weakness of Arms/Hands: None  Permission Sought/Granted     Emotional Assessment   Attitude/Demeanor/Rapport: Engaged Affect (typically observed): Accepting, Appropriate Orientation: : Oriented to Self, Oriented to Place, Oriented to  Time, Oriented to Situation   Psych Involvement: No (comment)  Admission diagnosis:  CHF (congestive heart failure) (HCC) [I50.9] Acute pulmonary edema (HCC) [J81.0] Peripheral edema [R60.9] Atrial fibrillation with RVR ([place]) [I48.91] Patient Active Problem List   Diagnosis Date Noted  . CHF (congestive heart failure) ([place]) 08/01/2019  . Atrial fibrillation with RVR ([place]) 08/01/2019  . Peripheral edema 08/01/2019  . Prolonged QT interval 08/01/2019  . Elevated brain natriuretic peptide (BNP) level 08/01/2019  . Essential hypertension 08/01/2019  . Hyperlipidemia 08/01/2019  . Elevated serum creatinine 08/01/2019  . History of colonic polyps   . Diverticulosis of colon without hemorrhage   . History of adenomatous polyp of colon 10/03/2015   PCP:  [name], MD Pharmacy:   [facility] [address] [place] [address] Phone: [phone] Fax: [phone]   Readmission Risk Interventions No flowsheet data found.

## 2019-08-02 NOTE — Progress Notes (Signed)
Pt with new onset nausea. He still denies any pain/chest pain or discomfort. Pt has prolonged QTC so zofran isn't an option. Dr Olevia Bowens on the the floor and asked to look at patient.

## 2019-08-02 NOTE — Progress Notes (Signed)
Pt on the floor and in bed comfortably. He occasionally moans but denies any chest pain or discomfort.

## 2019-08-02 NOTE — Progress Notes (Signed)
Gave report to [facility].  

## 2019-08-02 NOTE — Progress Notes (Signed)
    Made aware by Dr. Bronson Ing the patient has an EF of 15-20% by echo this admission. R/LHC recommended with transfer to [facility].  I went to review this information with the patient and he is in agreement to proceed. The patient understands that risks include but are not limited to stroke (1 in 1000), death (1 in 36), kidney failure [usually temporary] (1 in 500), bleeding (1 in 200), allergic reaction [possibly serious] (1 in 200).    Will arrange transfer to the Cardiology service. Will need evaluation by the Advanced Heart Failure Team this admission.   At the time of my examination, the patient appeared to have more labored breathing than earlier this morning. Will order repeat CXR and BMET. He is currently on PO Lopressor with rates in the 110's to 120's. After discussion with Dr. Bronson Ing, will restart IV Amiodarone once on a floor this can be administered on and hold Lopressor for now given concern for low-output HF.   Signed, Erma Heritage, [name] 08/02/2019, 4:54 PM Pager: [phone]

## 2019-08-02 NOTE — ED Notes (Signed)
Informed admitting dr that pt's blood pressure is in the 79G systolic. He gave orders to hold amiodarone for now.

## 2019-08-02 NOTE — Progress Notes (Signed)
MEWS score now a 2 (yellow). MD notified, will recheck VS in 2 hours.

## 2019-08-02 NOTE — Progress Notes (Signed)
PROGRESS NOTE [facility]   Chris Guerrero  ION:[id number]  DOB: 19-Mar-1941  DOA: 08/01/2019 PCP: [name], MD   Brief Admission Hx: 79 y.o. male with medical history significant for hypertension and hyperlipidemia who presents to the emergency department from Dr. [name] office due to progressive shortness of breath and increased leg swelling that has been ongoing for 3-4 weeks.  Patient complained of progressive increased leg swelling, abdominal swelling with tightness and slowly progressive shortness of breath which worsens with exertion and improves with rest.  He was noted to be in atrial fibrillation with RVR.  MDM/Assessment & Plan:   1. Paroxysmal new onset A. fib with RVR-the patient was initially started on diltiazem and amiodarone IV but with soft blood pressures they were discontinued.  He has now been seen by the cardiology team and they have started him on oral Lopressor 12.5 mg 3 times daily.  His rate is better controlled now.  Continue to monitor to see if he will tolerate this therapy.  He is on IV heparin for full anticoagulation. 2. Acute diastolic CHF exacerbation-patient presents with progressive dyspnea increasing edema in the lower extremities.  He is being diuresed with IV Lasix.  40 mg IV every 12 hours ordered.  Continue to monitor daily weights, I's/O, electrolytes.  Follow-up 2D echocardiogram. 3. Mild troponin elevation-suspect demand ischemia with new onset atrial fibrillation with RVR.  The patient has no symptoms of chest pain or shortness of breath at this time. 4. Hyperlipidemia-continue statin therapy as ordered. 5. Essential hypertension-holding home losartan and Cardura per cardiology.  His soft blood pressures have limited some therapy. 6. Stage III CKD-baseline is unknown at this time.  Continue to follow creatinine closely with diuresis.  DVT prophylaxis: IV heparin infusion Code Status: Full Family Communication: Patient determined to have  capacity, updated at bedside, verbalizes understanding Disposition Plan: From home, continue IV heparin, IV diuresis with Lasix, subspecialty consultation and follow-up, not stable to discharge home at this time   Consultants:  Cardiology  Procedures:    Antimicrobials:     Subjective: Patient awake and alert and in no apparent distress, cooperative and pleasant, feels short of breath and excessive fluid in legs and feet  Objective: Vitals:   08/02/19 0700 08/02/19 0757 08/02/19 0930 08/02/19 1327  BP: 109/81 108/85 111/89 107/85  Pulse: 89 99 (!) 101 93  Resp: 11 20  18   Temp:  (!) 97.5 F (36.4 C)  98.6 F (37 C)  TempSrc:  Oral    SpO2: 98% 97%  96%  Weight:  103.5 kg    Height:        Intake/Output Summary (Last 24 hours) at 08/02/2019 1559 Last data filed at 08/02/2019 1300 Gross per 24 hour  Intake 507.57 ml  Output 1500 ml  Net -992.43 ml   Filed Weights   08/01/19 1653 08/02/19 0757  Weight: 98.4 kg 103.5 kg     REVIEW OF SYSTEMS  As per history otherwise all reviewed and reported negative  Exam:  General exam: Elderly gentleman awake and alert appears overweight and fluid overloaded. Respiratory system: Bibasilar crackles.  No increased work of breathing. Cardiovascular system: Irregularly irregular S1 & S2 heard.  Mild JVD, no murmurs, gallops, clicks.  2+ pedal edema bilateral lower extremities pitting. Gastrointestinal system: Abdomen is nondistended, soft and nontender. Normal bowel sounds heard. Central nervous system: Alert and oriented. No focal neurological deficits. Extremities: 2+ pitting edema bilateral lower extremities.  Data Reviewed: Basic Metabolic Panel:  Recent Labs  Lab 08/01/19 1721 08/02/19 0234  NA 138 136  K 3.6 3.6  CL 104 102  CO2 23 24  GLUCOSE 109* 124*  BUN 26* 25*  CREATININE 1.45* 1.52*  CALCIUM 8.9 8.8*  MG  --  1.8  PHOS  --  4.0   Liver Function Tests: Recent Labs  Lab 08/01/19 1721  AST 31  ALT  22  ALKPHOS 103  BILITOT 1.7*  PROT 6.5  ALBUMIN 3.5   No results for input(s): LIPASE, AMYLASE in the last 168 hours. No results for input(s): AMMONIA in the last 168 hours. CBC: Recent Labs  Lab 08/01/19 1721 08/02/19 0234  WBC 6.5 5.8  NEUTROABS 4.1  --   HGB 14.4 14.1  HCT 44.9 44.1  MCV 92.4 93.0  PLT 157 152   Cardiac Enzymes: No results for input(s): CKTOTAL, CKMB, CKMBINDEX, TROPONINI in the last 168 hours. CBG (last 3)  No results for input(s): GLUCAP in the last 72 hours. Recent Results (from the past 240 hour(s))  Respiratory Panel by RT PCR (Flu A&B, Covid) - Nasopharyngeal Swab     Status: None   Collection Time: 08/01/19  7:18 PM   Specimen: Nasopharyngeal Swab  Result Value Ref Range Status   SARS Coronavirus 2 by RT PCR NEGATIVE NEGATIVE Final    Comment: (NOTE) SARS-CoV-2 target nucleic acids are NOT DETECTED. The SARS-CoV-2 RNA is generally detectable in upper respiratoy specimens during the acute phase of infection. The lowest concentration of SARS-CoV-2 viral copies this assay can detect is 131 copies/mL. A negative result does not preclude SARS-Cov-2 infection and should not be used as the sole basis for treatment or other patient management decisions. A negative result may occur with  improper specimen collection/handling, submission of specimen other than nasopharyngeal swab, presence of viral mutation(s) within the areas targeted by this assay, and inadequate number of viral copies (<131 copies/mL). A negative result must be combined with clinical observations, patient history, and epidemiological information. The expected result is Negative. Fact Sheet for Patients:  PinkCheek.be Fact Sheet for Healthcare Providers:  GravelBags.it This test is not yet ap proved or cleared by the [place] FDA and  has been authorized for detection and/or diagnosis of SARS-CoV-2 by FDA under an  Emergency Use Authorization (EUA). This EUA will remain  in effect (meaning this test can be used) for the duration of the COVID-19 declaration under Section 564(b)(1) of the Act, 21 U.S.C. section 360bbb-3(b)(1), unless the authorization is terminated or revoked sooner.    Influenza A by PCR NEGATIVE NEGATIVE Final   Influenza B by PCR NEGATIVE NEGATIVE Final    Comment: (NOTE) The Xpert Xpress SARS-CoV-2/FLU/RSV assay is intended as an aid in  the diagnosis of influenza from Nasopharyngeal swab specimens and  should not be used as a sole basis for treatment. Nasal washings and  aspirates are unacceptable for Xpert Xpress SARS-CoV-2/FLU/RSV  testing. Fact Sheet for Patients: PinkCheek.be Fact Sheet for Healthcare Providers: GravelBags.it This test is not yet approved or cleared by the [place] FDA and  has been authorized for detection and/or diagnosis of SARS-CoV-2 by  FDA under an Emergency Use Authorization (EUA). This EUA will remain  in effect (meaning this test can be used) for the duration of the  Covid-19 declaration under Section 564(b)(1) of the Act, 21  U.S.C. section 360bbb-3(b)(1), unless the authorization is  terminated or revoked. Performed at [facility], [address], [place], [place] [address]  Studies: DG Chest Portable 1 View  Result Date: 08/01/2019 CLINICAL DATA:  Short of breath EXAM: PORTABLE CHEST 1 VIEW COMPARISON:  05/09/2018 FINDINGS: Hypoventilation. Decreased lung volume with bibasilar atelectasis and small pleural effusions. Cardiac enlargement with mild vascular congestion. Atherosclerotic aortic arch. IMPRESSION: Cardiac enlargement with small pleural effusions bilaterally. Possible mild fluid overload Hypoventilation with bibasilar atelectasis. Electronically Signed   By: Franchot Gallo M.D.   On: 08/01/2019 18:10   ECHOCARDIOGRAM COMPLETE  Result Date: 08/02/2019     ECHOCARDIOGRAM REPORT   Patient Name:   Chris Guerrero [facility] Date of Exam: 08/02/2019 Medical Rec #:  [id number]      Height:       69.0 in Accession #:    [id number]     Weight:       228.2 lb Date of Birth:  1941-04-02       BSA:          2.19 m Patient Age:    31 years       BP:           111/89 mmHg Patient Gender: M              HR:           101 bpm. Exam Location:  [facility] Procedure: 2D Echo Indications:    CHF-Acute Systolic 517.00 / F74.94  History:        Patient has no prior history of Echocardiogram examinations.                 CHF, Arrythmias:Atrial Fibrillation; Risk Factors:Non-Smoker and                 Hypertension. Elevated brain natriuretic peptide , Prolonged QT                 interval.  Sonographer:    Leavy Cella RDCS (AE) Referring Phys: [id number] OLADAPO ADEFESO IMPRESSIONS  1. Left ventricular ejection fraction, by estimation, is <20%. The left ventricle has severely decreased function. The left ventricle demonstrates global hypokinesis. There is mild concentric left ventricular hypertrophy. Left ventricular diastolic parameters are indeterminate.  2. Right ventricular systolic function is severely reduced. The right ventricular size is mildly enlarged. There is mildly elevated pulmonary artery systolic pressure.  3. The mitral valve is grossly normal. Mild mitral valve regurgitation.  4. The aortic valve was not well visualized. Aortic valve regurgitation is not visualized. No aortic stenosis is present.  5. The inferior vena cava is normal in size with <50% respiratory variability, suggesting right atrial pressure of 8 mmHg. FINDINGS  Left Ventricle: Left ventricular ejection fraction, by estimation, is <20%. The left ventricle has severely decreased function. The left ventricle demonstrates global hypokinesis. The left ventricular internal cavity size was normal in size. There is mild concentric left ventricular hypertrophy. Left ventricular diastolic parameters are indeterminate. Normal  left ventricular filling pressure. Right Ventricle: The right ventricular size is mildly enlarged. No increase in right ventricular wall thickness. Right ventricular systolic function is severely reduced. There is mildly elevated pulmonary artery systolic pressure. The tricuspid regurgitant velocity is 2.50 m/s, and with an assumed right atrial pressure of 10 mmHg, the estimated right ventricular systolic pressure is 63.8 mmHg. Left Atrium: Left atrial size was normal in size. Right Atrium: Right atrial size was normal in size. Pericardium: There is no evidence of pericardial effusion. Mitral Valve: The mitral valve is grossly normal. Mild mitral annular calcification. Mild mitral valve regurgitation. Tricuspid Valve: The tricuspid  valve is grossly normal. Tricuspid valve regurgitation is mild. Aortic Valve: The aortic valve was not well visualized. Aortic valve regurgitation is not visualized. No aortic stenosis is present. Pulmonic Valve: The pulmonic valve was not well visualized. Pulmonic valve regurgitation is not visualized. Aorta: The aortic root is normal in size and structure. Venous: The inferior vena cava is normal in size with less than 50% respiratory variability, suggesting right atrial pressure of 8 mmHg. IAS/Shunts: The interatrial septum was not well visualized.  LEFT VENTRICLE PLAX 2D LVIDd:         4.93 cm  Diastology LVIDs:         4.73 cm  LV e' lateral:   6.32 cm/s LV PW:         1.28 cm  LV E/e' lateral: 5.9 LV IVS:        1.18 cm  LV e' medial:    3.64 cm/s LVOT diam:     2.00 cm  LV E/e' medial:  10.2 LV SV Index:   4.62 LVOT Area:     3.14 cm  RIGHT VENTRICLE RV S prime:     6.46 cm/s TAPSE (M-mode): 1.1 cm LEFT ATRIUM             Index       RIGHT ATRIUM           Index LA diam:        4.30 cm 1.97 cm/m  RA Area:     19.70 cm LA Vol (A2C):   65.6 ml 30.02 ml/m RA Volume:   58.50 ml  26.77 ml/m LA Vol (A4C):   59.9 ml 27.41 ml/m LA Biplane Vol: 64.4 ml 29.47 ml/m   AORTA Ao Root  diam: 3.20 cm MITRAL VALVE               TRICUSPID VALVE MV Area (PHT): 2.20 cm    TR Peak grad:   25.0 mmHg MV Decel Time: 345 msec    TR Vmax:        250.00 cm/s MR Peak grad: 20.4 mmHg MR Vmax:      226.00 cm/s  SHUNTS MV E velocity: 37.20 cm/s  Systemic Diam: 2.00 cm MV A velocity: 17.10 cm/s MV E/A ratio:  2.18 Kate Sable MD Electronically signed by Kate Sable MD Signature Date/Time: 08/02/2019/12:35:04 PM    Final      Scheduled Meds:  atorvastatin  10 mg Oral QPM   furosemide  40 mg Intravenous Q12H   metoprolol tartrate  12.5 mg Oral TID   sodium chloride flush  3 mL Intravenous Q12H   Continuous Infusions:  sodium chloride     amiodarone Stopped (08/02/19 0107)   heparin 1,200 Units/hr (08/02/19 1223)    Principal Problem:   CHF (congestive heart failure) (HCC) Active Problems:   Atrial fibrillation with RVR (HCC)   Peripheral edema   Prolonged QT interval   Elevated brain natriuretic peptide (BNP) level   Essential hypertension   Hyperlipidemia   Elevated serum creatinine   Time spent:   Irwin Brakeman, MD [facility] 08/02/2019, 3:59 PM    LOS: 1 day  How to contact the [facility] Attending or Consulting provider 7A - 7P or covering provider during after hours [place], for this patient?  1. Check the care team in [facility] ([facility]) and look for a) attending/consulting TR[facility] provider listed and b) the [facility] team listed 2. Log into [email or web address] and use 's universal password to access. If you do  not have the password, please contact the hospital operator. 3. Locate the [facility] provider you are looking for under [facility] and page to a number that you can be directly reached. 4. If you still have difficulty reaching the provider, please page the [facility] (Director on Call) for the Hospitalists listed on amion for assistance.

## 2019-08-02 NOTE — Progress Notes (Signed)
ANTICOAGULATION CONSULT NOTE - Initial Consult  Pharmacy Consult for Heparin Indication: atrial fibrillation  No Known Allergies  Patient Measurements: Height: 5\' 9"  (175.3 cm) Weight: 217 lb (98.4 kg) IBW/kg (Calculated) : 70.7 Heparin Dosing Weight: 91 kg  Vital Signs: Temp: 97.9 F (36.6 C) (02/16 1656) Temp Source: Oral (02/16 1656) BP: 96/57 (02/17 0301) Pulse Rate: 74 (02/17 0301)  Labs: Recent Labs    08/01/19 1721 08/01/19 1932 08/02/19 0234  HGB 14.4  --  14.1  HCT 44.9  --  44.1  PLT 157  --  152  APTT 30  --   --   LABPROT 14.0  --   --   INR 1.1  --   --   HEPARINUNFRC  --   --  1.25*  CREATININE 1.45*  --  1.52*  TROPONINIHS 25* 27*  --     Estimated Creatinine Clearance: 44.8 mL/min (A) (by C-G formula based on SCr of 1.52 mg/dL (H)).   Medical History: Past Medical History:  Diagnosis Date  . High cholesterol   . Hyperlipemia   . Hypertension     Medications:  No oral anticoagulation PTA  Assessment:  78 yr sent from PCP to ED with diagnosis of new onset AFib.  PMH significant for HTN and HLD.  Pharmacy consulted to dose IV heparin  08/02/19 0345 Heparin level: 1.25 IU/mL, supra-therapeutic on heparin at 1400 units/hr RN reports some very minor bleeding at infusion site CBC: remains stable, with Hb 14.1 (from 14.4) and plates 152 (from 720)  Goal of Therapy:  Heparin level 0.3-0.7 units/ml Monitor platelets by anticoagulation protocol: Yes   Plan:   Hold heparin infusion for one hour (from 0345 to 0445)  Re-start heparin infusion at 0445 and reduce rate to  1200 units/hr  Re-check heparin level ~8hrs after heparin rate change  Daily heparin level and CBC while on heparin   Monitor for signs and symptoms of bleeding  Despina Pole, PharmD 08/02/2019,3:42 AM

## 2019-08-02 NOTE — ED Notes (Signed)
ED TO INPATIENT HANDOFF REPORT  ED Nurse Name and Phone #: [phone]  S Name/Age/Gender Chris Guerrero 79 y.o. male Room/Bed: APA12/APA12  Code Status   Code Status: Full Code  Home/SNF/Other Home Patient oriented to: situation Is this baseline? Yes   Triage Complete: Triage complete  Chief Complaint CHF (congestive heart failure) ([facility]) [I50.9]  Triage Note Pt reports feet and leg swelling x 1 1/2 weeks.  Reports sob and swelling in abd also.  Dr. Willey Blade sent pt to er for eval of new onset afib and chf.      Allergies No Known Allergies  Level of Care/Admitting Diagnosis ED Disposition    ED Disposition Condition [facility] Area: [facility] [[id number]]  Level of Care: Stepdown [14]  Covid Evaluation: Asymptomatic Screening Protocol (No Symptoms)  Diagnosis: CHF (congestive heart failure) [facility]) [[id number]]  Admitting Physician: Bernadette Hoit [[id number]]  Attending Physician: Bernadette Hoit [[id number]]  Estimated length of stay: past midnight tomorrow  Certification:: I certify this patient will need inpatient services for at least 2 midnights       B Medical/Surgery History Past Medical History:  Diagnosis Date  . High cholesterol   . Hyperlipemia   . Hypertension    Past Surgical History:  Procedure Laterality Date  . CHOLECYSTECTOMY    . COLONOSCOPY  09/04/2010   RMR: diverticula, cecal polyp = tubular adenoma; Repeat in 5 years  . COLONOSCOPY N/A 10/10/2015   Procedure: COLONOSCOPY;  Surgeon: Daneil Dolin, MD;  Location: [facility];  Service: Endoscopy;  Laterality: N/A;  1300  . HERNIA REPAIR       A IV Location/Drains/Wounds Patient Lines/Drains/Airways Status   Active Line/Drains/Airways    Name:   Placement date:   Placement time:   Site:   Days:   Peripheral IV 08/01/19 Left Forearm   08/01/19    1715    Forearm   1   Peripheral IV 08/01/19 Left Hand   08/01/19    1850    Hand   1          Intake/Output Last 24  hours  Intake/Output Summary (Last 24 hours) at 08/02/2019 0549 Last data filed at 08/02/2019 0108 Gross per 24 hour  Intake 267.57 ml  Output 400 ml  Net -132.43 ml    Labs/Imaging Results for orders placed or performed during the hospital encounter of 08/01/19 (from the past 48 hour(s))  Comprehensive metabolic panel     Status: Abnormal   Collection Time: 08/01/19  5:21 PM  Result Value Ref Range   Sodium 138 135 - 145 mmol/L   Potassium 3.6 3.5 - 5.1 mmol/L   Chloride 104 98 - 111 mmol/L   CO2 23 22 - 32 mmol/L   Glucose, Bld 109 (H) 70 - 99 mg/dL   BUN 26 (H) 8 - 23 mg/dL   Creatinine, Ser 1.45 (H) 0.61 - 1.24 mg/dL   Calcium 8.9 8.9 - 10.3 mg/dL   Total Protein 6.5 6.5 - 8.1 g/dL   Albumin 3.5 3.5 - 5.0 g/dL   AST 31 15 - 41 U/L   ALT 22 0 - 44 U/L   Alkaline Phosphatase 103 38 - 126 U/L   Total Bilirubin 1.7 (H) 0.3 - 1.2 mg/dL   GFR calc non Af Amer 45 (L) >60 mL/min   GFR calc Af Amer 52 (L) >60 mL/min   Anion gap 11 5 - 15    Comment: Performed at [facility], [address], [place], [place] [address]  Brain natriuretic peptide     Status: Abnormal   Collection Time: 08/01/19  5:21 PM  Result Value Ref Range   B Natriuretic Peptide 1,068.0 (H) 0.0 - 100.0 pg/mL    Comment: Performed at [facility], [address] [place], [place] [address]  Troponin I (High Sensitivity)     Status: Abnormal   Collection Time: 08/01/19  5:21 PM  Result Value Ref Range   Troponin I (High Sensitivity) 25 (H) <18 ng/L    Comment: (NOTE) Elevated high sensitivity troponin I (hsTnI) values and significant  changes across serial measurements may suggest ACS but many other  chronic and acute conditions are known to elevate hsTnI results.  Refer to the "Links" section for chest pain algorithms and additional  guidance. Performed at [facility], [address], [place], [place] [address]   CBC with Differential     Status: None   Collection Time: 08/01/19  5:21 PM  Result Value Ref  Range   WBC 6.5 4.0 - 10.5 K/uL   RBC 4.86 4.22 - 5.81 MIL/uL   Hemoglobin 14.4 13.0 - 17.0 g/dL   HCT 44.9 39.0 - 52.0 %   MCV 92.4 80.0 - 100.0 fL   MCH 29.6 26.0 - 34.0 pg   MCHC 32.1 30.0 - 36.0 g/dL   RDW 14.6 11.5 - 15.5 %   Platelets 157 150 - 400 K/uL   nRBC 0.0 0.0 - 0.2 %   Neutrophils Relative % 62 %   Neutro Abs 4.1 1.7 - 7.7 K/uL   Lymphocytes Relative 23 %   Lymphs Abs 1.5 0.7 - 4.0 K/uL   Monocytes Relative 11 %   Monocytes Absolute 0.7 0.1 - 1.0 K/uL   Eosinophils Relative 2 %   Eosinophils Absolute 0.1 0.0 - 0.5 K/uL   Basophils Relative 1 %   Basophils Absolute 0.0 0.0 - 0.1 K/uL   Immature Granulocytes 1 %   Abs Immature Granulocytes 0.03 0.00 - 0.07 K/uL    Comment: Performed at [facility], [address] [place], [place] [address]  Urinalysis, Routine w reflex microscopic     Status: None   Collection Time: 08/01/19  5:21 PM  Result Value Ref Range   Color, Urine YELLOW YELLOW   APPearance CLEAR CLEAR   Specific Gravity, Urine 1.008 1.005 - 1.030   pH 5.0 5.0 - 8.0   Glucose, UA NEGATIVE NEGATIVE mg/dL   Hgb urine dipstick NEGATIVE NEGATIVE   Bilirubin Urine NEGATIVE NEGATIVE   Ketones, ur NEGATIVE NEGATIVE mg/dL   Protein, ur NEGATIVE NEGATIVE mg/dL   Nitrite NEGATIVE NEGATIVE   Leukocytes,Ua NEGATIVE NEGATIVE    Comment: Performed at [facility], [address], [place], [place] [address]  APTT     Status: None   Collection Time: 08/01/19  5:21 PM  Result Value Ref Range   aPTT 30 24 - 36 seconds    Comment: Performed at [facility], [address] [place], [place] [address]  Protime-INR     Status: None   Collection Time: 08/01/19  5:21 PM  Result Value Ref Range   Prothrombin Time 14.0 11.4 - 15.2 seconds   INR 1.1 0.8 - 1.2    Comment: (NOTE) INR goal varies based on device and disease states. Performed at [facility], [address], [place],  [address]   Respiratory Panel by RT PCR (Flu A&B, Covid) - Nasopharyngeal Swab      Status: None  Collection Time: 08/01/19  7:18 PM   Specimen: Nasopharyngeal Swab  Result Value Ref Range   SARS Coronavirus 2 by RT PCR NEGATIVE NEGATIVE    Comment: (NOTE) SARS-CoV-2 target nucleic acids are NOT DETECTED. The SARS-CoV-2 RNA is generally detectable in upper respiratoy specimens during the acute phase of infection. The lowest concentration of SARS-CoV-2 viral copies this assay can detect is 131 copies/mL. A negative result does not preclude SARS-Cov-2 infection and should not be used as the sole basis for treatment or other patient management decisions. A negative result may occur with  improper specimen collection/handling, submission of specimen other than nasopharyngeal swab, presence of viral mutation(s) within the areas targeted by this assay, and inadequate number of viral copies (<131 copies/mL). A negative result must be combined with clinical observations, patient history, and epidemiological information. The expected result is Negative. Fact Sheet for Patients:  PinkCheek.be Fact Sheet for Healthcare Providers:  GravelBags.it This test is not yet ap proved or cleared by the [place] FDA and  has been authorized for detection and/or diagnosis of SARS-CoV-2 by FDA under an Emergency Use Authorization (EUA). This EUA will remain  in effect (meaning this test can be used) for the duration of the COVID-19 declaration under Section 564(b)(1) of the Act, 21 U.S.C. section 360bbb-3(b)(1), unless the authorization is terminated or revoked sooner.    Influenza A by PCR NEGATIVE NEGATIVE   Influenza B by PCR NEGATIVE NEGATIVE    Comment: (NOTE) The Xpert Xpress SARS-CoV-2/FLU/RSV assay is intended as an aid in  the diagnosis of influenza from Nasopharyngeal swab specimens and  should not be used as a sole basis for treatment. Nasal washings and  aspirates are unacceptable for Xpert Xpress  SARS-CoV-2/FLU/RSV  testing. Fact Sheet for Patients: PinkCheek.be Fact Sheet for Healthcare Providers: GravelBags.it This test is not yet approved or cleared by the [place] FDA and  has been authorized for detection and/or diagnosis of SARS-CoV-2 by  FDA under an Emergency Use Authorization (EUA). This EUA will remain  in effect (meaning this test can be used) for the duration of the  Covid-19 declaration under Section 564(b)(1) of the Act, 21  U.S.C. section 360bbb-3(b)(1), unless the authorization is  terminated or revoked. Performed at [facility], [address], [place], [place] [address]   Troponin I (High Sensitivity)     Status: Abnormal   Collection Time: 08/01/19  7:32 PM  Result Value Ref Range   Troponin I (High Sensitivity) 27 (H) <18 ng/L    Comment: (NOTE) Elevated high sensitivity troponin I (hsTnI) values and significant  changes across serial measurements may suggest ACS but many other  chronic and acute conditions are known to elevate hsTnI results.  Refer to the "Links" section for chest pain algorithms and additional  guidance. Performed at [facility], [address], [place], [place] [address]   CBC     Status: None   Collection Time: 08/02/19  2:34 AM  Result Value Ref Range   WBC 5.8 4.0 - 10.5 K/uL   RBC 4.74 4.22 - 5.81 MIL/uL   Hemoglobin 14.1 13.0 - 17.0 g/dL   HCT 44.1 39.0 - 52.0 %   MCV 93.0 80.0 - 100.0 fL   MCH 29.7 26.0 - 34.0 pg   MCHC 32.0 30.0 - 36.0 g/dL   RDW 14.6 11.5 - 15.5 %   Platelets 152 150 - 400 K/uL   nRBC 0.0 0.0 - 0.2 %    Comment: Performed at [facility]  [facility], [address], [place], [place] [address]  Heparin level (unfractionated)     Status: Abnormal   Collection Time: 08/02/19  2:34 AM  Result Value Ref Range   Heparin Unfractionated 1.25 (H) 0.30 - 0.70 IU/mL    Comment: RESULTS CONFIRMED BY MANUAL DILUTION (NOTE) If heparin results are below expected  values, and patient dosage has  been confirmed, suggest follow up testing of antithrombin III levels. Performed at [facility], [address] [place], [place] [address]   Basic metabolic panel     Status: Abnormal   Collection Time: 08/02/19  2:34 AM  Result Value Ref Range   Sodium 136 135 - 145 mmol/L   Potassium 3.6 3.5 - 5.1 mmol/L   Chloride 102 98 - 111 mmol/L   CO2 24 22 - 32 mmol/L   Glucose, Bld 124 (H) 70 - 99 mg/dL   BUN 25 (H) 8 - 23 mg/dL   Creatinine, Ser 1.52 (H) 0.61 - 1.24 mg/dL   Calcium 8.8 (L) 8.9 - 10.3 mg/dL   GFR calc non Af Amer 43 (L) >60 mL/min   GFR calc Af Amer 49 (L) >60 mL/min   Anion gap 10 5 - 15    Comment: Performed at [facility], [address], [place], [place] [address]  Magnesium     Status: None   Collection Time: 08/02/19  2:34 AM  Result Value Ref Range   Magnesium 1.8 1.7 - 2.4 mg/dL    Comment: Performed at [facility], [address] [place], [place] [address]  Phosphorus     Status: None   Collection Time: 08/02/19  2:34 AM  Result Value Ref Range   Phosphorus 4.0 2.5 - 4.6 mg/dL    Comment: Performed at [facility], [address], [place], [place] [address]   DG Chest Portable 1 View  Result Date: 08/01/2019 CLINICAL DATA:  Short of breath EXAM: PORTABLE CHEST 1 VIEW COMPARISON:  05/09/2018 FINDINGS: Hypoventilation. Decreased lung volume with bibasilar atelectasis and small pleural effusions. Cardiac enlargement with mild vascular congestion. Atherosclerotic aortic arch. IMPRESSION: Cardiac enlargement with small pleural effusions bilaterally. Possible mild fluid overload Hypoventilation with bibasilar atelectasis. Electronically Signed   By: Franchot Gallo M.D.   On: 08/01/2019 18:10    Pending Labs Unresulted Labs (From admission, onward)    Start     Ordered   08/03/19 0500  Heparin level (unfractionated)  Daily,   R     08/02/19 0354   08/02/19 1300  Heparin level (unfractionated)  Once-Timed,   STAT    Comments: Call  pharmacist if Heparin level is < 0.3 or > 0.7.  IF level is between these parameters then continue heparin at same rate.    08/02/19 0354   08/02/19 0500  CBC  Daily,   R     08/01/19 1800   08/02/19 8921  Basic metabolic panel  Daily,   R     08/01/19 1953          Vitals/Pain Today's Vitals   08/02/19 0230 08/02/19 0301 08/02/19 0400 08/02/19 0500  BP: 106/85 (!) 96/57 90/68 97/86   Pulse:  74  80  Resp: 13 (!) 21 20 15   Temp:      TempSrc:      SpO2:  96%  98%  Weight:      Height:      PainSc:        Isolation Precautions No active isolations  Medications Medications  amiodarone (NEXTERONE PREMIX)  360-4.14 MG/200ML-% (1.8 mg/mL) IV infusion (0 mg/hr Intravenous Stopped 08/02/19 0042)    Followed by  amiodarone (NEXTERONE PREMIX) 360-4.14 MG/200ML-% (1.8 mg/mL) IV infusion (0 mg/hr Intravenous Hold 08/02/19 0107)  sodium chloride flush (NS) 0.9 % injection 3 mL (3 mLs Intravenous Not Given 08/01/19 2039)  sodium chloride flush (NS) 0.9 % injection 3 mL (has no administration in time range)  0.9 %  sodium chloride infusion (has no administration in time range)  acetaminophen (TYLENOL) tablet 650 mg (has no administration in time range)  furosemide (LASIX) injection 40 mg (has no administration in time range)  lisinopril (ZESTRIL) tablet 40 mg (has no administration in time range)  aspirin EC tablet 81 mg (81 mg Oral Given 08/01/19 2055)  heparin ADULT infusion 100 units/mL (25000 units/243mL sodium chloride 0.45%) (1,200 Units/hr Intravenous Restarted 08/02/19 0445)  heparin bolus via infusion 4,000 Units (4,000 Units Intravenous Bolus from Bag 08/01/19 1829)  furosemide (LASIX) injection 40 mg (40 mg Intravenous Given 08/01/19 1853)    Mobility walks Low fall risk   Focused Assessments Cardiac Assessment Handoff:    No results found for: CKTOTAL, CKMB, CKMBINDEX, TROPONINI No results found for: DDIMER Does the Patient currently have chest pain? No       R Recommendations: See Admitting Provider Note  Report given to:   Additional Notes: Pt is alert and oriented. Pt uses urinal

## 2019-08-02 NOTE — Progress Notes (Signed)
ANTICOAGULATION CONSULT NOTE -   Pharmacy Consult for Heparin Indication: atrial fibrillation  No Known Allergies  Patient Measurements: Height: 5\' 9"  (175.3 cm) Weight: 228 lb 2.8 oz (103.5 kg) IBW/kg (Calculated) : 70.7 Heparin Dosing Weight: 91 kg  Vital Signs: Temp: 98.6 F (37 C) (02/17 1327) Temp Source: Oral (02/17 0757) BP: 107/85 (02/17 1327) Pulse Rate: 93 (02/17 1327)  Labs: Recent Labs    08/01/19 1721 08/01/19 1932 08/02/19 0234 08/02/19 1448  HGB 14.4  --  14.1  --   HCT 44.9  --  44.1  --   PLT 157  --  152  --   APTT 30  --   --   --   LABPROT 14.0  --   --   --   INR 1.1  --   --   --   HEPARINUNFRC  --   --  1.25* 0.93*  CREATININE 1.45*  --  1.52*  --   TROPONINIHS 25* 27*  --   --     Estimated Creatinine Clearance: 45.9 mL/min (A) (by C-G formula based on SCr of 1.52 mg/dL (H)).   Medical History: Past Medical History:  Diagnosis Date  . Hyperlipemia   . Hypertension     Medications:  No oral anticoagulation PTA  Assessment:  78 yr sent from PCP to ED with diagnosis of new onset AFib.  PMH significant for HTN and HLD.  Pharmacy consulted to dose IV heparin  HL is still supratherapeutic 0.93.   Goal of Therapy:  Heparin level 0.3-0.7 units/ml Monitor platelets by anticoagulation protocol: Yes   Plan:   Decrease heparin infusion to 1000 units/hr  Re-check heparin level ~8hrs after heparin rate change  Daily heparin level and CBC while on heparin   Monitor for signs and symptoms of bleeding  Isac Sarna, BS [place], BCPS Clinical Pharmacist Pager [phone] 08/02/2019,3:33 PM

## 2019-08-02 NOTE — Progress Notes (Signed)
*  PRELIMINARY RESULTS* Echocardiogram 2D Echocardiogram has been performed.  Chris Guerrero 08/02/2019, 12:22 PM

## 2019-08-02 NOTE — Plan of Care (Signed)
Nutrition Education Note  RD consulted for nutrition education regarding new onset CHF. Met with patient at bedside this afternoon, pt reports he was trying to take a nap. Patient stated that his appetite has not been too good lately. He recalls usually eating a big breakfast (eggs, toast, grits, bacon, biscuit) and snacking on peanut butter crackers, canned fruit, chips throughout the day. Patient reports not eating out as much as he used to secondary to the pandemic.   RD provided "Heart Failure Nutrition Therapy" handout from the [employer]. Reviewed patient's dietary recall. Provided examples on ways to decrease sodium intake in diet. Discouraged intake of processed foods and use of salt shaker. Encouraged fresh fruits and vegetables as well as whole grain sources of carbohydrates to maximize fiber intake.   RD discussed why it is important for patient to adhere to diet recommendations, and emphasized the role of fluids, foods to avoid, and importance of weighing self daily. Teach back method used.  Expect fair compliance.  Body mass index is 33.7 kg/m. Pt meets criteria for obese based on current BMI.  Current diet order is HH, patient is consuming approximately 75% of meals at this time. Labs and medications reviewed. No further nutrition interventions warranted at this time. RD contact information provided. If additional nutrition issues arise, please re-consult RD.   Lajuan Lines, RD, LDN Clinical Nutrition Office Telephone [phone] After Hours/Weekend Pager # in [facility]
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
## 2019-08-03 ENCOUNTER — Inpatient Hospital Stay (HOSPITAL_COMMUNITY): Payer: No Typology Code available for payment source

## 2019-08-03 ENCOUNTER — Inpatient Hospital Stay: Payer: Self-pay

## 2019-08-03 DIAGNOSIS — I4891 Unspecified atrial fibrillation: Secondary | ICD-10-CM

## 2019-08-03 DIAGNOSIS — I5021 Acute systolic (congestive) heart failure: Secondary | ICD-10-CM

## 2019-08-03 DIAGNOSIS — I5041 Acute combined systolic (congestive) and diastolic (congestive) heart failure: Secondary | ICD-10-CM

## 2019-08-03 DIAGNOSIS — R7989 Other specified abnormal findings of blood chemistry: Secondary | ICD-10-CM

## 2019-08-03 DIAGNOSIS — J810 Acute pulmonary edema: Secondary | ICD-10-CM

## 2019-08-03 LAB — COOXEMETRY PANEL
Carboxyhemoglobin: 0.8 % (ref 0.5–1.5)
Carboxyhemoglobin: 1.1 % (ref 0.5–1.5)
Methemoglobin: 0.8 % (ref 0.0–1.5)
Methemoglobin: 0.9 % (ref 0.0–1.5)
O2 Saturation: 45.8 %
O2 Saturation: 61.9 %
Total hemoglobin: 15 g/dL (ref 12.0–16.0)
Total hemoglobin: 13.4 g/dL (ref 12.0–16.0)

## 2019-08-03 LAB — BASIC METABOLIC PANEL
Anion gap: 15 (ref 5–15)
BUN: 27 mg/dL — ABNORMAL HIGH (ref 8–23)
CO2: 21 mmol/L — ABNORMAL LOW (ref 22–32)
Calcium: 8.8 mg/dL — ABNORMAL LOW (ref 8.9–10.3)
Chloride: 101 mmol/L (ref 98–111)
Creatinine, Ser: 1.91 mg/dL — ABNORMAL HIGH (ref 0.61–1.24)
GFR calc Af Amer: 38 mL/min — ABNORMAL LOW (ref >60)
GFR calc non Af Amer: 32 mL/min — ABNORMAL LOW (ref >60)
Glucose, Bld: 141 mg/dL — ABNORMAL HIGH (ref 70–99)
Potassium: 4.1 mmol/L (ref 3.5–5.1)
Sodium: 137 mmol/L (ref 135–145)

## 2019-08-03 LAB — HEPARIN LEVEL (UNFRACTIONATED)
Heparin Unfractionated: 0.88 IU/mL — ABNORMAL HIGH (ref 0.30–0.70)
Heparin Unfractionated: 0.17 IU/mL — ABNORMAL LOW (ref 0.30–0.70)
Heparin Unfractionated: 0.3 IU/mL (ref 0.30–0.70)

## 2019-08-03 LAB — MRSA PCR SCREENING: MRSA by PCR: NEGATIVE

## 2019-08-03 MED ORDER — SODIUM CHLORIDE 0.9 % IV SOLN: INTRAVENOUS | Status: DC

## 2019-08-03 MED ORDER — MILRINONE LACTATE IN DEXTROSE 20-5 MG/100ML-% IV SOLN
0.2500 ug/kg/min | INTRAVENOUS | Status: DC
  Administered 2019-08-03: 0.25 ug/kg/min via INTRAVENOUS
  Filled 2019-08-03: qty 100

## 2019-08-03 MED ORDER — SODIUM CHLORIDE 0.9% FLUSH: 10.0000 mL | INTRAVENOUS | Status: DC | PRN

## 2019-08-03 MED ORDER — ASPIRIN 81 MG PO CHEW: 81.0000 mg | CHEWABLE_TABLET | ORAL | Status: DC

## 2019-08-03 MED ORDER — SODIUM CHLORIDE 0.9% FLUSH
3.0000 mL | Freq: Two times a day (BID) | INTRAVENOUS | Status: DC
  Administered 2019-08-03: 11:00:00 3 mL via INTRAVENOUS

## 2019-08-03 MED ORDER — PROCHLORPERAZINE EDISYLATE 10 MG/2ML IJ SOLN
5.0000 mg | Freq: Once | INTRAMUSCULAR | Status: AC
  Administered 2019-08-03: 5 mg via INTRAVENOUS
  Filled 2019-08-03: qty 2

## 2019-08-03 MED ORDER — FUROSEMIDE 10 MG/ML IJ SOLN
80.0000 mg | Freq: Two times a day (BID) | INTRAMUSCULAR | Status: DC
  Administered 2019-08-03 (×2): 80 mg via INTRAVENOUS
  Filled 2019-08-03 (×2): qty 8

## 2019-08-03 MED ORDER — MAGNESIUM SULFATE 2 GM/50ML IV SOLN
2.0000 g | Freq: Once | INTRAVENOUS | Status: AC
  Administered 2019-08-03: 2 g via INTRAVENOUS
  Filled 2019-08-03: qty 50

## 2019-08-03 MED ORDER — MILRINONE LACTATE IN DEXTROSE 20-5 MG/100ML-% IV SOLN
0.2500 ug/kg/min | INTRAVENOUS | Status: DC
  Administered 2019-08-03: 23:00:00 0.375 ug/kg/min via INTRAVENOUS
  Administered 2019-08-04 – 2019-08-06 (×5): 0.25 ug/kg/min via INTRAVENOUS
  Filled 2019-08-03 (×6): qty 100

## 2019-08-03 MED ORDER — SODIUM CHLORIDE 0.9% FLUSH
10.0000 mL | Freq: Two times a day (BID) | INTRAVENOUS | Status: DC
  Administered 2019-08-06 – 2019-08-09 (×5): 10 mL

## 2019-08-03 MED ORDER — SODIUM CHLORIDE 0.9% FLUSH: 3.0000 mL | INTRAVENOUS | Status: DC | PRN

## 2019-08-03 MED ORDER — SODIUM CHLORIDE 0.9% FLUSH
3.0000 mL | Freq: Two times a day (BID) | INTRAVENOUS | Status: DC
  Administered 2019-08-03: 14:00:00 3 mL via INTRAVENOUS

## 2019-08-03 MED ORDER — ASPIRIN 81 MG PO CHEW
81.0000 mg | CHEWABLE_TABLET | Freq: Once | ORAL | Status: AC
  Administered 2019-08-03: 06:00:00 81 mg via ORAL
  Filled 2019-08-03: qty 1

## 2019-08-03 MED ORDER — SODIUM CHLORIDE 0.9 % IV SOLN: 250.0000 mL | INTRAVENOUS | Status: DC | PRN

## 2019-08-03 NOTE — Progress Notes (Signed)
  CO-OX 46%  CVP 17  Increase milrinone to 0.375 mcg.   Sophiamarie Nease NP-C  4:09 PM

## 2019-08-03 NOTE — Progress Notes (Signed)
Pt off the floor with [facility] to transport to [place].

## 2019-08-03 NOTE — Care Management (Signed)
1414 08-03-19 Patient listed as having insurance via the [employer]. Case Manager contacted the [facility] to make the Fees Coordinator aware that the patient has been hospitalized. Awaiting phone call back from the [place]. Case Manager will continue to follow for additional transition of care needs. Bethena Roys, RN,BSN Case Manager.

## 2019-08-03 NOTE — Progress Notes (Signed)
Called report to [place]

## 2019-08-03 NOTE — Consult Note (Addendum)
Advanced Heart Failure Team Consult Note   Primary Physician: [name], MD PCP-Cardiologist:  Kate Sable, MD  Reason for Consultation: Heart Failure   HPI:    Chris Guerrero is seen today for evaluation of heart failure at the request of Dr Oval Linsey.   Chris Guerrero is a 79 year old with history of  HTN and hyperlipidemia. He quit drinking alcohol 3 months ago. Says developed productive cough. Prior to that he was drinking 2-3 drinks a day for the last 50 years.   Presented to [facility] with increased shortness of breath and lower extremity edema after being seen by PCP and found to be in new onset atrial fibrillation.CXR with bilateral small pleural effusions.  In the ED he was given IV lasix and started on diltiazem drip. Developed hypotension and switched to amiodarone drip which was also stopped due to hypotension. Started on lopressor. ECHO completed and showed EF < 20%, global hypokinesis,  and RV severely reduced.   Transferred to [facility] for HF work up and cath. Remains in A Fib.   Pertinent labs included: SARS2 negative, creatinine 1.45, mag 1.8, WBC 6.5   SH: Retired from [employer] January 2021. Lives alone. Does not smoker or drink alcohol.   Review of Systems: [y] = yes, [ ]  = no   . General: Weight gain [Y ]; Weight loss [ ] ; Anorexia [ ] ; Fatigue [Y ]; Fever [ ] ; Chills [ ] ; Weakness [ ]   . Cardiac: Chest pain/pressure [ ] ; Resting SOB [ ] ; Exertional SOB [Y ]; Orthopnea Jazmín.Cullens ]; Pedal Edema [Y ]; Palpitations [ ] ; Syncope [ ] ; Presyncope [ ] ; Paroxysmal nocturnal dyspnea[ ]   . Pulmonary: Cough [ ] ; Wheezing[ ] ; Hemoptysis[ ] ; Sputum [ ] ; Snoring [ ]   . GI: Vomiting[ ] ; Dysphagia[ ] ; Melena[ ] ; Hematochezia [ ] ; Heartburn[ ] ; Abdominal pain [ ] ; Constipation [ ] ; Diarrhea [ ] ; BRBPR [ ]   . GU: Hematuria[ ] ; Dysuria [ ] ; Nocturia[ ]   . Vascular: Pain in legs with walking [ ] ; Pain in feet with lying flat [ ] ; Non-healing sores [ ] ; Stroke [ ] ; TIA [ ] ; Slurred speech [ ] ;   . Neuro: Headaches[ ] ; Vertigo[ ] ; Seizures[ ] ; Paresthesias[ ] ;Blurred vision [ ] ; Diplopia [ ] ; Vision changes [ ]   . Ortho/Skin: Arthritis [ ] ; Joint pain [ ] ; Muscle pain [ ] ; Joint swelling [ ] ; Back Pain [Y ]; Rash [ ]   . Psych: Depression[ ] ; Anxiety[ ]   . Heme: Bleeding problems [ ] ; Clotting disorders [ ] ; Anemia [ ]   . Endocrine: Diabetes [ ] ; Thyroid dysfunction[ ]   Home Medications Prior to Admission medications   Medication Sig Start Date End Date Taking? Authorizing Provider  aspirin 81 MG tablet Take 81 mg by mouth daily.   Yes [provider]  atorvastatin (LIPITOR) 10 MG tablet Take 10 mg by mouth every evening.    Yes [provider]  doxazosin (CARDURA) 4 MG tablet Take 4 mg by mouth at bedtime.    Yes [provider]  ibuprofen (ADVIL) 200 MG tablet Take 200 mg by mouth every 6 (six) hours as needed for mild pain or moderate pain.   Yes [provider]  losartan (COZAAR) 50 MG tablet Take 50 mg by mouth daily.   Yes [provider]  Multiple Vitamin (MULTIVITAMIN) tablet Take 1 tablet by mouth daily.   Yes [provider]  traZODone (DESYREL) 50 MG tablet Take 50 mg by mouth  at bedtime.   Yes [provider]    Past Medical History: Past Medical History:  Diagnosis Date  . Hyperlipemia   . Hypertension     Past Surgical History: Past Surgical History:  Procedure Laterality Date  . CHOLECYSTECTOMY    . COLONOSCOPY  09/04/2010   RMR: diverticula, cecal polyp = tubular adenoma; Repeat in 5 years  . COLONOSCOPY N/A 10/10/2015   Procedure: COLONOSCOPY;  Surgeon: Daneil Dolin, MD;  Location: [facility];  Service: Endoscopy;  Laterality: N/A;  1300  . HERNIA REPAIR      Family History: Family History  Problem Relation Age of Onset  . Colon cancer Neg Hx     Social History: Social History   Socioeconomic History  . Marital status: Divorced    Spouse name: Not on file  . Number of children:  Not on file  . Years of education: Not on file  . Highest education level: Not on file  Occupational History  . Not on file  Tobacco Use  . Smoking status: Never Smoker  . Smokeless tobacco: Current User    Types: Chew  . Tobacco comment: rarely chews tobacco  Substance and Sexual Activity  . Alcohol use: Not Currently    Alcohol/week: 0.0 standard drinks    Comment: pt says drank "pretty good" up until 2 or 3 months ago.  . Drug use: No  . Sexual activity: Not on file  Other Topics Concern  . Not on file  Social History Narrative  . Not on file   Social Determinants of Health   Financial Resource Strain:   . Difficulty of Paying Living Expenses: Not on file  Food Insecurity:   . Worried About Charity fundraiser in the Last Year: Not on file  . Ran Out of Food in the Last Year: Not on file  Transportation Needs:   . Lack of Transportation (Medical): Not on file  . Lack of Transportation (Non-Medical): Not on file  Physical Activity:   . Days of Exercise per Week: Not on file  . Minutes of Exercise per Session: Not on file  Stress:   . Feeling of Stress : Not on file  Social Connections:   . Frequency of Communication with Friends and Family: Not on file  . Frequency of Social Gatherings with Friends and Family: Not on file  . Attends Religious Services: Not on file  . Active Member of Clubs or Organizations: Not on file  . Attends Archivist Meetings: Not on file  . Marital Status: Not on file    Allergies:  No Known Allergies  Objective:    Vital Signs:   Temp:  [97.4 F (36.3 C)-98.6 F (37 C)] 97.5 F (36.4 C) (02/18 0756) Pulse Rate:  [32-137] 100 (02/18 0423) Resp:  [0-31] 19 (02/18 0756) BP: (82-108)/(65-89) 106/78 (02/18 0756) SpO2:  [92 %-99 %] 96 % (02/18 0756) Weight:  [98.1 kg] 98.1 kg (02/18 0157) Last BM Date: 08/03/19  Weight change: Filed Weights   08/02/19 2115 08/02/19 2145 08/03/19 0157  Weight: 98.1 kg 98.1 kg 98.1 kg     Intake/Output:   Intake/Output Summary (Last 24 hours) at 08/03/2019 1045 Last data filed at 08/03/2019 0437 Gross per 24 hour  Intake 1127.19 ml  Output 700 ml  Net 427.19 ml      Physical Exam    General: . No resp difficulty HEENT: normal Neck: supple. JVP to jaw . Carotids 2+ bilat; no bruits.  No lymphadenopathy or thyromegaly appreciated. Cor: PMI nondisplaced. Irregular  rate & rhythm. No rubs, gallops or murmurs. Lungs: clear on 2 liters [place]  Abdomen: soft, nontender, nondistended. No hepatosplenomegaly. No bruits or masses. Good bowel sounds. Extremities: no cyanosis, clubbing, rash,  Rand LLE 3+ edema Neuro: alert & orientedx3, cranial nerves grossly intact. moves all 4 extremities w/o difficulty. Affect pleasant   Telemetry   A fib 100s with occasional PVCs.   EKG    A fib   Labs   Basic Metabolic Panel: Recent Labs  Lab 08/01/19 1721 08/01/19 1721 08/02/19 0234 08/02/19 1448 08/03/19 0643  NA 138  --  136 136 137  K 3.6  --  3.6 3.8 4.1  CL 104  --  102 103 101  CO2 23  --  24 22 21*  GLUCOSE 109*  --  124* 161* 141*  BUN 26*  --  25* 27* 27*  CREATININE 1.45*  --  1.52* 1.52* 1.91*  CALCIUM 8.9   < > 8.8* 8.8* 8.8*  MG  --   --  1.8  --   --   PHOS  --   --  4.0  --   --    < > = values in this interval not displayed.    Liver Function Tests: Recent Labs  Lab 08/01/19 1721 08/02/19 1448  AST 31 30  ALT 22 23  ALKPHOS 103 99  BILITOT 1.7* 1.4*  PROT 6.5 6.3*  ALBUMIN 3.5 3.5   No results for input(s): LIPASE, AMYLASE in the last 168 hours. No results for input(s): AMMONIA in the last 168 hours.  CBC: Recent Labs  Lab 08/01/19 1721 08/02/19 0234  WBC 6.5 5.8  NEUTROABS 4.1  --   HGB 14.4 14.1  HCT 44.9 44.1  MCV 92.4 93.0  PLT 157 152    Cardiac Enzymes: No results for input(s): CKTOTAL, CKMB, CKMBINDEX, TROPONINI in the last 168 hours.  BNP: BNP (last 3 results) Recent Labs    08/01/19 1721  BNP 1,068.0*     ProBNP (last 3 results) No results for input(s): PROBNP in the last 8760 hours.   CBG: No results for input(s): GLUCAP in the last 168 hours.  Coagulation Studies: Recent Labs    08/01/19 1721  LABPROT 14.0  INR 1.1     Imaging   DG Chest Port 1V same Day  Result Date: 08/02/2019 CLINICAL DATA:  Dyspnea, hypertension EXAM: PORTABLE CHEST 1 VIEW COMPARISON:  Chest radiograph from one day prior. FINDINGS: Stable cardiomediastinal silhouette with mild cardiomegaly. No pneumothorax. Small bilateral pleural effusions. No overt pulmonary edema. Hazy bibasilar lung opacities. IMPRESSION: 1. Mild cardiomegaly without overt pulmonary edema. 2. Small bilateral pleural effusions. 3. Hazy bibasilar lung opacities, favor atelectasis. Electronically Signed   By: Ilona Sorrel M.D.   On: 08/02/2019 19:37   ECHOCARDIOGRAM COMPLETE  Result Date: 08/02/2019    ECHOCARDIOGRAM REPORT   Patient Name:   Chris Guerrero [facility] Date of Exam: 08/02/2019 Medical Rec #:  [id number]      Height:       69.0 in Accession #:    [id number]     Weight:       228.2 lb Date of Birth:  1941/01/14       BSA:          2.19 m Patient Age:    31 years       BP:           111/89 mmHg  Patient Gender: M              HR:           101 bpm. Exam Location:  [facility] Procedure: 2D Echo Indications:    CHF-Acute Systolic 387.56 / E33.29  History:        Patient has no prior history of Echocardiogram examinations.                 CHF, Arrythmias:Atrial Fibrillation; Risk Factors:Non-Smoker and                 Hypertension. Elevated brain natriuretic peptide , Prolonged QT                 interval.  Sonographer:    Leavy Cella RDCS (AE) Referring Phys: [id number] OLADAPO ADEFESO IMPRESSIONS  1. Left ventricular ejection fraction, by estimation, is <20%. The left ventricle has severely decreased function. The left ventricle demonstrates global hypokinesis. There is mild concentric left ventricular hypertrophy. Left ventricular diastolic  parameters are indeterminate.  2. Right ventricular systolic function is severely reduced. The right ventricular size is mildly enlarged. There is mildly elevated pulmonary artery systolic pressure.  3. The mitral valve is grossly normal. Mild mitral valve regurgitation.  4. The aortic valve was not well visualized. Aortic valve regurgitation is not visualized. No aortic stenosis is present.  5. The inferior vena cava is normal in size with <50% respiratory variability, suggesting right atrial pressure of 8 mmHg. FINDINGS  Left Ventricle: Left ventricular ejection fraction, by estimation, is <20%. The left ventricle has severely decreased function. The left ventricle demonstrates global hypokinesis. The left ventricular internal cavity size was normal in size. There is mild concentric left ventricular hypertrophy. Left ventricular diastolic parameters are indeterminate. Normal left ventricular filling pressure. Right Ventricle: The right ventricular size is mildly enlarged. No increase in right ventricular wall thickness. Right ventricular systolic function is severely reduced. There is mildly elevated pulmonary artery systolic pressure. The tricuspid regurgitant velocity is 2.50 m/s, and with an assumed right atrial pressure of 10 mmHg, the estimated right ventricular systolic pressure is 60.6 mmHg. Left Atrium: Left atrial size was normal in size. Right Atrium: Right atrial size was normal in size. Pericardium: There is no evidence of pericardial effusion. Mitral Valve: The mitral valve is grossly normal. Mild mitral annular calcification. Mild mitral valve regurgitation. Tricuspid Valve: The tricuspid valve is grossly normal. Tricuspid valve regurgitation is mild. Aortic Valve: The aortic valve was not well visualized. Aortic valve regurgitation is not visualized. No aortic stenosis is present. Pulmonic Valve: The pulmonic valve was not well visualized. Pulmonic valve regurgitation is not visualized. Aorta: The  aortic root is normal in size and structure. Venous: The inferior vena cava is normal in size with less than 50% respiratory variability, suggesting right atrial pressure of 8 mmHg. IAS/Shunts: The interatrial septum was not well visualized.  LEFT VENTRICLE PLAX 2D LVIDd:         4.93 cm  Diastology LVIDs:         4.73 cm  LV e' lateral:   6.32 cm/s LV PW:         1.28 cm  LV E/e' lateral: 5.9 LV IVS:        1.18 cm  LV e' medial:    3.64 cm/s LVOT diam:     2.00 cm  LV E/e' medial:  10.2 LV SV Index:   4.62 LVOT Area:     3.14 cm  RIGHT  VENTRICLE RV S prime:     6.46 cm/s TAPSE (M-mode): 1.1 cm LEFT ATRIUM             Index       RIGHT ATRIUM           Index LA diam:        4.30 cm 1.97 cm/m  RA Area:     19.70 cm LA Vol (A2C):   65.6 ml 30.02 ml/m RA Volume:   58.50 ml  26.77 ml/m LA Vol (A4C):   59.9 ml 27.41 ml/m LA Biplane Vol: 64.4 ml 29.47 ml/m   AORTA Ao Root diam: 3.20 cm MITRAL VALVE               TRICUSPID VALVE MV Area (PHT): 2.20 cm    TR Peak grad:   25.0 mmHg MV Decel Time: 345 msec    TR Vmax:        250.00 cm/s Chris Peak grad: 20.4 mmHg Chris Vmax:      226.00 cm/s  SHUNTS MV E velocity: 37.20 cm/s  Systemic Diam: 2.00 cm MV A velocity: 17.10 cm/s MV E/A ratio:  2.18 Kate Sable MD Electronically signed by Kate Sable MD Signature Date/Time: 08/02/2019/12:35:04 PM    Final    [place] EKG SITE RITE  Result Date: 08/03/2019 If Site Rite image not attached, placement could not be confirmed due to current cardiac rhythm.     Medications:     Current Medications: . atorvastatin  10 mg Oral QPM  . Chlorhexidine Gluconate Cloth  6 each Topical Daily  . furosemide  80 mg Intravenous BID  . sodium chloride flush  3 mL Intravenous Q12H  . sodium chloride flush  3 mL Intravenous Q12H     Infusions: . sodium chloride    . sodium chloride    . sodium chloride 10 mL/hr at 08/03/19 0437  . amiodarone 30 mg/hr (08/03/19 0729)  . heparin 800 Units/hr (08/03/19 0023)       Assessment/Plan   1/ Acute Biventricular Heart Failure  ECHO with Biventricular HF LVEF< 20% RV severely reduced.  Volume status elevated. Continue IV lasix 80 mg twice a day.  Place PICC to guide diuresis and check CO-OX.  No bb for now. No ARB/dig/spiro with elevated creatinine.  Add ted hose.   2. New Atrial Fibrillation  On amio drip with controlled rate On heparin drip  Once diuresed will need cardioversion  3. AKI  Unclear baseline.  Creatinine trending up 1.5>1.9  May need to add inotropes.  4. Hyperlipidemia  Continue statin.    Length of Stay: 2  Darrick Grinder, NP  08/03/2019, 10:45 AM  Advanced Heart Failure Team Pager [phone] (M-F; [place])  Please contact [facility] for night-coverage after hours (4p -7a ) and weekends on [email or web address]  Patient seen with NP, agree with the above note.   Patient has prior history of HTN and hyperlipidemia.  He reports about 1 month of cough and dyspnea with exertion as well as progressive edema. No chest pain. He saw his PCP on 2/16 and was noted to be in atrial fibrillation with RVR, he was then sent to [facility] for admission.  Hs-TnI was flat at 25 => 27.  BNP elevated.  He was initially tried on diltiazem gtt but became hypotensive, now on amiodarone gtt.  Echo was done, showing EF < 20% with diffuse hypokinesis, severely decreased RV function.   General: NAD Neck: JVP 14+ cm, no  thyromegaly or thyroid nodule.  Lungs: Mild crackles at bases.  CV: Nondisplaced PMI.  Heart irregular S1/S2, no S3/S4, no murmur.  1+ edema 3/4 to knees bilaterally.  Abdomen: Soft, nontender, no hepatosplenomegaly, no distention.  Skin: Intact without lesions or rashes.  Neurologic: Alert and oriented x 3.  Psych: Normal affect. Extremities: No clubbing or cyanosis.  HEENT: Normal.   1. Acute systolic CHF: Echo this admission with EF <20%, severely decreased RV function.  Cardiomyopathy of uncertain etiology.  No prior echo or history  of CHF.  He has been symptomatic for about 1 month with edema and exertional dyspnea.  He was in atrial fibrillation with RVR at presentation.  Tachycardia-mediated cardiomyopathy is certainly a concern, uncertain of the duration of his atrial fibrillation/RVR.  Coronary disease is also a concern, ECG could be consistent with old anterior MI.  Currently, he is volume overloaded with SBP around 100 and rising creatinine to 1.9 with attempts at diuresis.  I am concerned for possible low output HF.  - Place PICC now and follow CVP and co-ox.  - If co-ox is low (as I expect), will start milrinone 0.25 mcg/kg/min (rate-controlled with amiodarone).  - Continue Lasix 80 mg IV bid for now, follow response once milrinone started (hopefully will pick up).  - Will add additional cardiomyopathy meds as we see how his renal function and BP fare with initial steps.  - If creatinine improves on milrinone, will plan LHC/RHC tomorrow morning.  Discussed risks/benefits with patient and he agrees to procedure.  - Once he is diuresed further, will plan TEE-guided DCCV (likely early next week).  2. Atrial fibrillation: Admitted with afib/RVR of uncertain duration.  Unsure if atrial fibrillation/RVR was cause of CMP (tachy-mediated) or an effect of the cardiomyopathy.  Regardless, he will need restoration of NSR.  - Continue amiodarone gtt for rate control.  - Continue heparin gtt for now, to apixaban after cath.  - Will plan TEE-guided DCCV after further diuresis.  3. AKI on ?CKD stage 3: Initial creatinine 1.5, up to 1.9 today with diuresis.  Suspect cardiorenal syndrome.  - As above, will likely start milrinone today.  Hopefully will help renal fxn.   Loralie Champagne 08/03/2019 11:37 AM

## 2019-08-03 NOTE — Progress Notes (Signed)
ANTICOAGULATION CONSULT NOTE -   Pharmacy Consult for Heparin Indication: atrial fibrillation  No Known Allergies  Patient Measurements: Height: 5\' 9"  (175.3 cm) Weight: 216 lb 4.3 oz (98.1 kg) IBW/kg (Calculated) : 70.7 Heparin Dosing Weight: 91 kg  Vital Signs: Temp: 97.5 F (36.4 C) (02/18 0756) Temp Source: Oral (02/18 0423) BP: 106/78 (02/18 0756) Pulse Rate: 100 (02/18 0423)  Labs: Recent Labs    08/01/19 1721 08/01/19 1721 08/01/19 1932 08/02/19 0234 08/02/19 0234 08/02/19 1448 08/02/19 2309 08/03/19 0643 08/03/19 0853  HGB 14.4  --   --  14.1  --   --   --   --   --   HCT 44.9  --   --  44.1  --   --   --   --   --   PLT 157  --   --  152  --   --   --   --   --   APTT 30  --   --   --   --   --   --   --   --   LABPROT 14.0  --   --   --   --   --   --   --   --   INR 1.1  --   --   --   --   --   --   --   --   HEPARINUNFRC  --   --   --  1.25*   < > 0.93* 0.88*  --  0.17*  CREATININE 1.45*   < >  --  1.52*  --  1.52*  --  1.91*  --   TROPONINIHS 25*  --  27*  --   --   --   --   --   --    < > = values in this interval not displayed.    Estimated Creatinine Clearance: 35.6 mL/min (A) (by C-G formula based on SCr of 1.91 mg/dL (H)).   Medical History: Past Medical History:  Diagnosis Date  . Hyperlipemia   . Hypertension      Assessment: 66 yoM with new onset AFib and HF started on IV heparin. CHADSVASc = 4, no AC PTA. Heparin level subtherapeutic this morning after several rate reductions, no CBC.  Goal of Therapy:  Heparin level 0.3-0.7 units/ml Monitor platelets by anticoagulation protocol: Yes   Plan:  -Increase heparin back to 900 units/h -Recheck heparin level this evening if cath delayed   Arrie Senate, PharmD, BCPS Clinical Pharmacist [phone] Please check AMION for all [place] numbers 08/03/2019

## 2019-08-03 NOTE — Progress Notes (Signed)
ANTICOAGULATION CONSULT NOTE -   Pharmacy Consult for Heparin Indication: atrial fibrillation  No Known Allergies  Patient Measurements: Height: 5\' 9"  (175.3 cm) Weight: 216 lb 4.3 oz (98.1 kg) IBW/kg (Calculated) : 70.7 Heparin Dosing Weight: 91 kg  Vital Signs: Temp: 97.7 F (36.5 C) (02/17 2145) Temp Source: Oral (02/17 2145) BP: 97/65 (02/17 2315) Pulse Rate: 58 (02/17 2315)  Labs: Recent Labs    08/01/19 1721 08/01/19 1932 08/02/19 0234 08/02/19 1448 08/02/19 2309  HGB 14.4  --  14.1  --   --   HCT 44.9  --  44.1  --   --   PLT 157  --  152  --   --   APTT 30  --   --   --   --   LABPROT 14.0  --   --   --   --   INR 1.1  --   --   --   --   HEPARINUNFRC  --   --  1.25* 0.93* 0.88*  CREATININE 1.45*  --  1.52* 1.52*  --   TROPONINIHS 25* 27*  --   --   --     Estimated Creatinine Clearance: 44.8 mL/min (A) (by C-G formula based on SCr of 1.52 mg/dL (H)).   Medical History: Past Medical History:  Diagnosis Date  . Hyperlipemia   . Hypertension     Medications:  No oral anticoagulation PTA  Assessment:  78 yr sent from PCP to ED with diagnosis of new onset AFib.  PMH significant for HTN and HLD.  Pharmacy consulted to dose IV heparin  Heparin level remains supratherapeutic (0.88) on gtt at 1000 units/hr. No bleeding noted.    Goal of Therapy:  Heparin level 0.3-0.7 units/ml Monitor platelets by anticoagulation protocol: Yes   Plan:   Decrease heparin infusion to 800 units/hr  Re-check heparin level ~8hrs after heparin rate change  Daily heparin level and CBC while on heparin   Monitor for signs and symptoms of bleeding  Sherlon Handing, PharmD, BCPS Please see amion for complete clinical pharmacist phone list 08/03/2019,12:17 AM

## 2019-08-03 NOTE — Progress Notes (Signed)
Progress Note  Patient Name: Chris Guerrero Date of Encounter: 08/03/2019  Primary Cardiologist:  Kate Sable, MD new  Subjective   SOB w/ minimal exertion, on O2. No chest pain, thinks SOB started about the first of the year. Feels abd is bloated, no appetite.  Inpatient Medications    Scheduled Meds: . atorvastatin  10 mg Oral QPM  . Chlorhexidine Gluconate Cloth  6 each Topical Daily  . furosemide  40 mg Intravenous Q12H  . sodium chloride flush  3 mL Intravenous Q12H  . sodium chloride flush  3 mL Intravenous Q12H   Continuous Infusions: . sodium chloride    . sodium chloride    . sodium chloride 10 mL/hr at 08/03/19 0437  . amiodarone 30 mg/hr (08/03/19 0729)  . heparin 800 Units/hr (08/03/19 0023)   PRN Meds: sodium chloride, sodium chloride, acetaminophen, sodium chloride flush, sodium chloride flush   Vital Signs    Vitals:   08/03/19 0157 08/03/19 0220 08/03/19 0423 08/03/19 0756  BP:  102/89 104/75 106/78  Pulse:   100   Resp:  (!) 27 (!) 24 19  Temp:  97.7 F (36.5 C) (!) 97.4 F (36.3 C) (!) 97.5 F (36.4 C)  TempSrc:  Oral Oral   SpO2:   99% 96%  Weight: 98.1 kg     Height:        Intake/Output Summary (Last 24 hours) at 08/03/2019 0807 Last data filed at 08/03/2019 0437 Gross per 24 hour  Intake 1127.19 ml  Output 700 ml  Net 427.19 ml   Filed Weights   08/02/19 2115 08/02/19 2145 08/03/19 0157  Weight: 98.1 kg 98.1 kg 98.1 kg   Last Weight  Most recent update: 08/03/2019  1:57 AM   Weight  98.1 kg (216 lb 4.3 oz)           Weight change: 5.07 kg   Telemetry    Atrial fib, rate generally ok, PVCs - Personally Reviewed  ECG    02/18 ECG is Atrial fib, HR 97, PVCs, diffuse T wave flattening, ?inc LBBB - Personally Reviewed  Physical Exam   General: Well developed, well nourished, male appearing in no acute distress. Head: Normocephalic, atraumatic.  Neck: Supple without bruits, JVD 11 cm. Lungs:  Resp regular and  unlabored, decreased BS bases w/ some rales. Heart: Irreg R&R, S1, S2, no S3, S4, or murmur; no rub. Abdomen: moderately firm, non-tender, non-distended with normoactive bowel sounds. No hepatomegaly. No rebound/guarding. No obvious abdominal masses. Extremities: No clubbing, cyanosis, 1+ edema. Distal pedal pulses are 2+ bilaterally. Neuro: Alert and oriented X 3. Moves all extremities spontaneously. Psych: Normal affect.  Labs    Hematology Recent Labs  Lab 08/01/19 1721 08/02/19 0234  WBC 6.5 5.8  RBC 4.86 4.74  HGB 14.4 14.1  HCT 44.9 44.1  MCV 92.4 93.0  MCH 29.6 29.7  MCHC 32.1 32.0  RDW 14.6 14.6  PLT 157 152    Chemistry Recent Labs  Lab 08/01/19 1721 08/01/19 1721 08/02/19 0234 08/02/19 1448 08/03/19 0643  NA 138   < > 136 136 137  K 3.6   < > 3.6 3.8 4.1  CL 104   < > 102 103 101  CO2 23   < > 24 22 21*  GLUCOSE 109*   < > 124* 161* 141*  BUN 26*   < > 25* 27* 27*  CREATININE 1.45*   < > 1.52* 1.52* 1.91*  CALCIUM 8.9   < >  8.8* 8.8* 8.8*  PROT 6.5  --   --  6.3*  --   ALBUMIN 3.5  --   --  3.5  --   AST 31  --   --  30  --   ALT 22  --   --  23  --   ALKPHOS 103  --   --  99  --   BILITOT 1.7*  --   --  1.4*  --   GFRNONAA 45*   < > 43* 43* 32*  GFRAA 52*   < > 49* 49* 38*  ANIONGAP 11   < > 10 11 15    < > = values in this interval not displayed.     High Sensitivity Troponin:   Recent Labs  Lab 08/01/19 1721 08/01/19 1932  TROPONINIHS 25* 27*      BNP Recent Labs  Lab 08/01/19 1721  BNP 1,068.0*      Radiology    DG Chest Portable 1 View  Result Date: 08/01/2019 CLINICAL DATA:  Short of breath EXAM: PORTABLE CHEST 1 VIEW COMPARISON:  05/09/2018 FINDINGS: Hypoventilation. Decreased lung volume with bibasilar atelectasis and small pleural effusions. Cardiac enlargement with mild vascular congestion. Atherosclerotic aortic arch. IMPRESSION: Cardiac enlargement with small pleural effusions bilaterally. Possible mild fluid overload  Hypoventilation with bibasilar atelectasis. Electronically Signed   By: Franchot Gallo M.D.   On: 08/01/2019 18:10   DG Chest Port 1V same Day  Result Date: 08/02/2019 CLINICAL DATA:  Dyspnea, hypertension EXAM: PORTABLE CHEST 1 VIEW COMPARISON:  Chest radiograph from one day prior. FINDINGS: Stable cardiomediastinal silhouette with mild cardiomegaly. No pneumothorax. Small bilateral pleural effusions. No overt pulmonary edema. Hazy bibasilar lung opacities. IMPRESSION: 1. Mild cardiomegaly without overt pulmonary edema. 2. Small bilateral pleural effusions. 3. Hazy bibasilar lung opacities, favor atelectasis. Electronically Signed   By: Ilona Sorrel M.D.   On: 08/02/2019 19:37   ECHOCARDIOGRAM COMPLETE  Result Date: 08/02/2019    ECHOCARDIOGRAM REPORT   Patient Name:   Chris Guerrero [facility] Date of Exam: 08/02/2019 Medical Rec #:  [id number]      Height:       69.0 in Accession #:    [id number]     Weight:       228.2 lb Date of Birth:  11/15/1940       BSA:          2.19 m Patient Age:    79 years       BP:           111/89 mmHg Patient Gender: M              HR:           101 bpm. Exam Location:  [facility] Procedure: 2D Echo Indications:    CHF-Acute Systolic 063.01 / S01.09  History:        Patient has no prior history of Echocardiogram examinations.                 CHF, Arrythmias:Atrial Fibrillation; Risk Factors:Non-Smoker and                 Hypertension. Elevated brain natriuretic peptide , Prolonged QT                 interval.  Sonographer:    Leavy Cella RDCS (AE) Referring Phys: [id number] OLADAPO ADEFESO IMPRESSIONS  1. Left ventricular ejection fraction, by estimation, is <20%. The left ventricle has severely decreased function. The left  ventricle demonstrates global hypokinesis. There is mild concentric left ventricular hypertrophy. Left ventricular diastolic parameters are indeterminate.  2. Right ventricular systolic function is severely reduced. The right ventricular size is mildly enlarged. There  is mildly elevated pulmonary artery systolic pressure.  3. The mitral valve is grossly normal. Mild mitral valve regurgitation.  4. The aortic valve was not well visualized. Aortic valve regurgitation is not visualized. No aortic stenosis is present.  5. The inferior vena cava is normal in size with <50% respiratory variability, suggesting right atrial pressure of 8 mmHg. FINDINGS  Left Ventricle: Left ventricular ejection fraction, by estimation, is <20%. The left ventricle has severely decreased function. The left ventricle demonstrates global hypokinesis. The left ventricular internal cavity size was normal in size. There is mild concentric left ventricular hypertrophy. Left ventricular diastolic parameters are indeterminate. Normal left ventricular filling pressure. Right Ventricle: The right ventricular size is mildly enlarged. No increase in right ventricular wall thickness. Right ventricular systolic function is severely reduced. There is mildly elevated pulmonary artery systolic pressure. The tricuspid regurgitant velocity is 2.50 m/s, and with an assumed right atrial pressure of 10 mmHg, the estimated right ventricular systolic pressure is 27.7 mmHg. Left Atrium: Left atrial size was normal in size. Right Atrium: Right atrial size was normal in size. Pericardium: There is no evidence of pericardial effusion. Mitral Valve: The mitral valve is grossly normal. Mild mitral annular calcification. Mild mitral valve regurgitation. Tricuspid Valve: The tricuspid valve is grossly normal. Tricuspid valve regurgitation is mild. Aortic Valve: The aortic valve was not well visualized. Aortic valve regurgitation is not visualized. No aortic stenosis is present. Pulmonic Valve: The pulmonic valve was not well visualized. Pulmonic valve regurgitation is not visualized. Aorta: The aortic root is normal in size and structure. Venous: The inferior vena cava is normal in size with less than 50% respiratory variability,  suggesting right atrial pressure of 8 mmHg. IAS/Shunts: The interatrial septum was not well visualized.  LEFT VENTRICLE PLAX 2D LVIDd:         4.93 cm  Diastology LVIDs:         4.73 cm  LV e' lateral:   6.32 cm/s LV PW:         1.28 cm  LV E/e' lateral: 5.9 LV IVS:        1.18 cm  LV e' medial:    3.64 cm/s LVOT diam:     2.00 cm  LV E/e' medial:  10.2 LV SV Index:   4.62 LVOT Area:     3.14 cm  RIGHT VENTRICLE RV S prime:     6.46 cm/s TAPSE (M-mode): 1.1 cm LEFT ATRIUM             Index       RIGHT ATRIUM           Index LA diam:        4.30 cm 1.97 cm/m  RA Area:     19.70 cm LA Vol (A2C):   65.6 ml 30.02 ml/m RA Volume:   58.50 ml  26.77 ml/m LA Vol (A4C):   59.9 ml 27.41 ml/m LA Biplane Vol: 64.4 ml 29.47 ml/m   AORTA Ao Root diam: 3.20 cm MITRAL VALVE               TRICUSPID VALVE MV Area (PHT): 2.20 cm    TR Peak grad:   25.0 mmHg MV Decel Time: 345 msec    TR Vmax:  250.00 cm/s MR Peak grad: 20.4 mmHg MR Vmax:      226.00 cm/s  SHUNTS MV E velocity: 37.20 cm/s  Systemic Diam: 2.00 cm MV A velocity: 17.10 cm/s MV E/A ratio:  2.18 Kate Sable MD Electronically signed by Kate Sable MD Signature Date/Time: 08/02/2019/12:35:04 PM    Final      Cardiac Studies   ECHO:  08/02/2019 1. Left ventricular ejection fraction, by estimation, is <20%. The left  ventricle has severely decreased function. The left ventricle demonstrates  global hypokinesis. There is mild concentric left ventricular hypertrophy.  Left ventricular diastolic  parameters are indeterminate.  2. Right ventricular systolic function is severely reduced. The right  ventricular size is mildly enlarged. There is mildly elevated pulmonary  artery systolic pressure.  3. The mitral valve is grossly normal. Mild mitral valve regurgitation.  4. The aortic valve was not well visualized. Aortic valve regurgitation  is not visualized. No aortic stenosis is present.  5. The inferior vena cava is normal in size  with <50% respiratory  variability, suggesting right atrial pressure of 8 mmHg.   Patient Profile     79 y.o. male w/ hx HTN, HLD was admitted to [facility] 02/16 with CHF, Afib (both new). EF <20% on echo>>tx to [facility] for cath.   Assessment & Plan    1. Acute systolic CHF:  - Lasix d/c'd due to worsening renal function but still w/ sig volume overload - CXR no worse, but no better when repeated - was on Lasix 40 mg IV bid but only 1600 cc output recorded since admit - believe he needs a higher Lasix dose, discuss 80 mg IV bid w/ MD - cont to follow I/O, weights and renal function  2. Cardiomyopathy - no WMA on echo, no hx ischemic sx - however, ECG is abnl, needs ischemic eval - cath when able - BB d/c'd 2nd acute CHF, BP and renal function preclude ACE/ARB/Entresto/spiro  3. Persistent atrial fib - unknown duration, was in fib on admit - HR improved on amio, BB d/c'd  - on heparin - CHA2DS2-VASc = 4 (age x 2, HTN, CHF) - start oral anticoag once invasive procedures completed  Principal Problem:   CHF (congestive heart failure) (HCC) Active Problems:   Atrial fibrillation with RVR (HCC)   Peripheral edema   Prolonged QT interval   Elevated brain natriuretic peptide (BNP) level   Essential hypertension   Hyperlipidemia   Elevated serum creatinine   Acute pulmonary edema ([place])    Signed, Rosaria Ferries , [name] 8:07 AM 08/03/2019 Pager: [phone]

## 2019-08-03 NOTE — Progress Notes (Signed)
ANTICOAGULATION CONSULT NOTE -   Pharmacy Consult for Heparin Indication: atrial fibrillation  No Known Allergies  Patient Measurements: Height: 5\' 9"  (175.3 cm) Weight: 216 lb 4.3 oz (98.1 kg) IBW/kg (Calculated) : 70.7 Heparin Dosing Weight: 91 kg  Vital Signs: Temp: 97.7 F (36.5 C) (02/18 2051) Temp Source: Oral (02/18 2051) BP: 102/71 (02/18 2028) Pulse Rate: 66 (02/18 2051)  Labs: Recent Labs    08/01/19 1721 08/01/19 1721 08/01/19 1932 08/02/19 0234 08/02/19 0234 08/02/19 1448 08/02/19 1448 08/02/19 2309 08/03/19 0643 08/03/19 0853 08/03/19 2011  HGB 14.4  --   --  14.1  --   --   --   --   --   --   --   HCT 44.9  --   --  44.1  --   --   --   --   --   --   --   PLT 157  --   --  152  --   --   --   --   --   --   --   APTT 30  --   --   --   --   --   --   --   --   --   --   LABPROT 14.0  --   --   --   --   --   --   --   --   --   --   INR 1.1  --   --   --   --   --   --   --   --   --   --   HEPARINUNFRC  --   --   --  1.25*   < > 0.93*   < > 0.88*  --  0.17* 0.30  CREATININE 1.45*   < >  --  1.52*  --  1.52*  --   --  1.91*  --   --   TROPONINIHS 25*  --  27*  --   --   --   --   --   --   --   --    < > = values in this interval not displayed.    Estimated Creatinine Clearance: 35.6 mL/min (A) (by C-G formula based on SCr of 1.91 mg/dL (H)).   Medical History: Past Medical History:  Diagnosis Date  . Hyperlipemia   . Hypertension      Assessment: 2 yoM with new onset AFib and HF started on IV heparin. CHADSVASc = 4, no AC PTA.   Heparin level is therapeutic at low end of goal after rate increase to 900 units/hr (required several rate decreases prior due to elevated levels), no CBC.  Cath moved to 2/19 at 07:30 AM.   Goal of Therapy:  Heparin level 0.3-0.7 units/ml Monitor platelets by anticoagulation protocol: Yes   Plan:  -Increase heparin back to 950 units/h -Follow-up daily heparin level and CBC -Follow-up post-cath in  AM   Sloan Leiter, PharmD, BCPS, BCCCP Clinical Pharmacist Please refer to [facility] for [facility] Pharmacy numbers 08/03/2019

## 2019-08-03 NOTE — H&P (View-Only) (Signed)
Advanced Heart Failure Team Consult Note   Primary Physician: [name], MD PCP-Cardiologist:  Kate Sable, MD  Reason for Consultation: Heart Failure   HPI:    Chris Guerrero is seen today for evaluation of heart failure at the request of Dr Oval Linsey.   Chris Guerrero is a 79 year old with history of  HTN and hyperlipidemia. He quit drinking alcohol 3 months ago. Says developed productive cough. Prior to that he was drinking 2-3 drinks a day for the last 50 years.   Presented to [facility] with increased shortness of breath and lower extremity edema after being seen by PCP and found to be in new onset atrial fibrillation.CXR with bilateral small pleural effusions.  In the ED he was given IV lasix and started on diltiazem drip. Developed hypotension and switched to amiodarone drip which was also stopped due to hypotension. Started on lopressor. ECHO completed and showed EF < 20%, global hypokinesis,  and RV severely reduced.   Transferred to [facility] for HF work up and cath. Remains in A Fib.   Pertinent labs included: SARS2 negative, creatinine 1.45, mag 1.8, WBC 6.5   SH: Retired from [employer] January 2021. Lives alone. Does not smoker or drink alcohol.   Review of Systems: [y] = yes, [ ]  = no   . General: Weight gain [Y ]; Weight loss [ ] ; Anorexia [ ] ; Fatigue [Y ]; Fever [ ] ; Chills [ ] ; Weakness [ ]   . Cardiac: Chest pain/pressure [ ] ; Resting SOB [ ] ; Exertional SOB [Y ]; Orthopnea [Y ]; Pedal Edema [Y ]; Palpitations [ ] ; Syncope [ ] ; Presyncope [ ] ; Paroxysmal nocturnal dyspnea[ ]   . Pulmonary: Cough [ ] ; Wheezing[ ] ; Hemoptysis[ ] ; Sputum [ ] ; Snoring [ ]   . GI: Vomiting[ ] ; Dysphagia[ ] ; Melena[ ] ; Hematochezia [ ] ; Heartburn[ ] ; Abdominal pain [ ] ; Constipation [ ] ; Diarrhea [ ] ; BRBPR [ ]   . GU: Hematuria[ ] ; Dysuria [ ] ; Nocturia[ ]   . Vascular: Pain in legs with walking [ ] ; Pain in feet with lying flat [ ] ; Non-healing sores [ ] ; Stroke [ ] ; TIA [ ] ; Slurred speech [ ] ;    . Neuro: Headaches[ ] ; Vertigo[ ] ; Seizures[ ] ; Paresthesias[ ] ;Blurred vision [ ] ; Diplopia [ ] ; Vision changes [ ]   . Ortho/Skin: Arthritis [ ] ; Joint pain [ ] ; Muscle pain [ ] ; Joint swelling [ ] ; Back Pain [Y ]; Rash [ ]   . Psych: Depression[ ] ; Anxiety[ ]   . Heme: Bleeding problems [ ] ; Clotting disorders [ ] ; Anemia [ ]   . Endocrine: Diabetes [ ] ; Thyroid dysfunction[ ]   Home Medications Prior to Admission medications   Medication Sig Start Date End Date Taking? Authorizing Provider  aspirin 81 MG tablet Take 81 mg by mouth daily.   Yes [provider]  atorvastatin (LIPITOR) 10 MG tablet Take 10 mg by mouth every evening.    Yes [provider]  doxazosin (CARDURA) 4 MG tablet Take 4 mg by mouth at bedtime.    Yes [provider]  ibuprofen (ADVIL) 200 MG tablet Take 200 mg by mouth every 6 (six) hours as needed for mild pain or moderate pain.   Yes [provider]  losartan (COZAAR) 50 MG tablet Take 50 mg by mouth daily.   Yes [provider]  Multiple Vitamin (MULTIVITAMIN) tablet Take 1 tablet by mouth daily.   Yes [provider]  traZODone (DESYREL) 50 MG tablet Take 50 mg by  mouth at bedtime.   Yes [provider]    Past Medical History: Past Medical History:  Diagnosis Date  . Hyperlipemia   . Hypertension     Past Surgical History: Past Surgical History:  Procedure Laterality Date  . CHOLECYSTECTOMY    . COLONOSCOPY  09/04/2010   RMR: diverticula, cecal polyp = tubular adenoma; Repeat in 5 years  . COLONOSCOPY N/A 10/10/2015   Procedure: COLONOSCOPY;  Surgeon: Daneil Dolin, MD;  Location: [facility];  Service: Endoscopy;  Laterality: N/A;  1300  . HERNIA REPAIR      Family History: Family History  Problem Relation Age of Onset  . Colon cancer Neg Hx     Social History: Social History   Socioeconomic History  . Marital status: Divorced    Spouse name: Not on file  . Number of children:  Not on file  . Years of education: Not on file  . Highest education level: Not on file  Occupational History  . Not on file  Tobacco Use  . Smoking status: Never Smoker  . Smokeless tobacco: Current User    Types: Chew  . Tobacco comment: rarely chews tobacco  Substance and Sexual Activity  . Alcohol use: Not Currently    Alcohol/week: 0.0 standard drinks    Comment: pt says drank "pretty good" up until 2 or 3 months ago.  . Drug use: No  . Sexual activity: Not on file  Other Topics Concern  . Not on file  Social History Narrative  . Not on file   Social Determinants of Health   Financial Resource Strain:   . Difficulty of Paying Living Expenses: Not on file  Food Insecurity:   . Worried About Charity fundraiser in the Last Year: Not on file  . Ran Out of Food in the Last Year: Not on file  Transportation Needs:   . Lack of Transportation (Medical): Not on file  . Lack of Transportation (Non-Medical): Not on file  Physical Activity:   . Days of Exercise per Week: Not on file  . Minutes of Exercise per Session: Not on file  Stress:   . Feeling of Stress : Not on file  Social Connections:   . Frequency of Communication with Friends and Family: Not on file  . Frequency of Social Gatherings with Friends and Family: Not on file  . Attends Religious Services: Not on file  . Active Member of Clubs or Organizations: Not on file  . Attends Archivist Meetings: Not on file  . Marital Status: Not on file    Allergies:  No Known Allergies  Objective:    Vital Signs:   Temp:  [97.4 F (36.3 C)-98.6 F (37 C)] 97.5 F (36.4 C) (02/18 0756) Pulse Rate:  [32-137] 100 (02/18 0423) Resp:  [0-31] 19 (02/18 0756) BP: (82-108)/(65-89) 106/78 (02/18 0756) SpO2:  [92 %-99 %] 96 % (02/18 0756) Weight:  [98.1 kg] 98.1 kg (02/18 0157) Last BM Date: 08/03/19  Weight change: Filed Weights   08/02/19 2115 08/02/19 2145 08/03/19 0157  Weight: 98.1 kg 98.1 kg 98.1 kg     Intake/Output:   Intake/Output Summary (Last 24 hours) at 08/03/2019 1045 Last data filed at 08/03/2019 0437 Gross per 24 hour  Intake 1127.19 ml  Output 700 ml  Net 427.19 ml      Physical Exam    General: . No resp difficulty HEENT: normal Neck: supple. JVP to jaw . Carotids 2+ bilat; no  bruits. No lymphadenopathy or thyromegaly appreciated. Cor: PMI nondisplaced. Irregular  rate & rhythm. No rubs, gallops or murmurs. Lungs: clear on 2 liters [place]  Abdomen: soft, nontender, nondistended. No hepatosplenomegaly. No bruits or masses. Good bowel sounds. Extremities: no cyanosis, clubbing, rash,  Rand LLE 3+ edema Neuro: alert & orientedx3, cranial nerves grossly intact. moves all 4 extremities w/o difficulty. Affect pleasant   Telemetry   A fib 100s with occasional PVCs.   EKG    A fib   Labs   Basic Metabolic Panel: Recent Labs  Lab 08/01/19 1721 08/01/19 1721 08/02/19 0234 08/02/19 1448 08/03/19 0643  NA 138  --  136 136 137  K 3.6  --  3.6 3.8 4.1  CL 104  --  102 103 101  CO2 23  --  24 22 21*  GLUCOSE 109*  --  124* 161* 141*  BUN 26*  --  25* 27* 27*  CREATININE 1.45*  --  1.52* 1.52* 1.91*  CALCIUM 8.9   < > 8.8* 8.8* 8.8*  MG  --   --  1.8  --   --   PHOS  --   --  4.0  --   --    < > = values in this interval not displayed.    Liver Function Tests: Recent Labs  Lab 08/01/19 1721 08/02/19 1448  AST 31 30  ALT 22 23  ALKPHOS 103 99  BILITOT 1.7* 1.4*  PROT 6.5 6.3*  ALBUMIN 3.5 3.5   No results for input(s): LIPASE, AMYLASE in the last 168 hours. No results for input(s): AMMONIA in the last 168 hours.  CBC: Recent Labs  Lab 08/01/19 1721 08/02/19 0234  WBC 6.5 5.8  NEUTROABS 4.1  --   HGB 14.4 14.1  HCT 44.9 44.1  MCV 92.4 93.0  PLT 157 152    Cardiac Enzymes: No results for input(s): CKTOTAL, CKMB, CKMBINDEX, TROPONINI in the last 168 hours.  BNP: BNP (last 3 results) Recent Labs    08/01/19 1721  BNP 1,068.0*     ProBNP (last 3 results) No results for input(s): PROBNP in the last 8760 hours.   CBG: No results for input(s): GLUCAP in the last 168 hours.  Coagulation Studies: Recent Labs    08/01/19 1721  LABPROT 14.0  INR 1.1     Imaging   DG Chest Port 1V same Day  Result Date: 08/02/2019 CLINICAL DATA:  Dyspnea, hypertension EXAM: PORTABLE CHEST 1 VIEW COMPARISON:  Chest radiograph from one day prior. FINDINGS: Stable cardiomediastinal silhouette with mild cardiomegaly. No pneumothorax. Small bilateral pleural effusions. No overt pulmonary edema. Hazy bibasilar lung opacities. IMPRESSION: 1. Mild cardiomegaly without overt pulmonary edema. 2. Small bilateral pleural effusions. 3. Hazy bibasilar lung opacities, favor atelectasis. Electronically Signed   By: Ilona Sorrel M.D.   On: 08/02/2019 19:37   ECHOCARDIOGRAM COMPLETE  Result Date: 08/02/2019    ECHOCARDIOGRAM REPORT   Patient Name:   ZAKARI BATHE [facility] Date of Exam: 08/02/2019 Medical Rec #:  [id number]      Height:       69.0 in Accession #:    [id number]     Weight:       228.2 lb Date of Birth:  1940-11-03       BSA:          2.19 m Patient Age:    89 years       BP:           111/89  mmHg Patient Gender: M              HR:           101 bpm. Exam Location:  [facility] Procedure: 2D Echo Indications:    CHF-Acute Systolic 683.41 / D62.22  History:        Patient has no prior history of Echocardiogram examinations.                 CHF, Arrythmias:Atrial Fibrillation; Risk Factors:Non-Smoker and                 Hypertension. Elevated brain natriuretic peptide , Prolonged QT                 interval.  Sonographer:    Leavy Cella RDCS (AE) Referring Phys: [id number] OLADAPO ADEFESO IMPRESSIONS  1. Left ventricular ejection fraction, by estimation, is <20%. The left ventricle has severely decreased function. The left ventricle demonstrates global hypokinesis. There is mild concentric left ventricular hypertrophy. Left ventricular diastolic  parameters are indeterminate.  2. Right ventricular systolic function is severely reduced. The right ventricular size is mildly enlarged. There is mildly elevated pulmonary artery systolic pressure.  3. The mitral valve is grossly normal. Mild mitral valve regurgitation.  4. The aortic valve was not well visualized. Aortic valve regurgitation is not visualized. No aortic stenosis is present.  5. The inferior vena cava is normal in size with <50% respiratory variability, suggesting right atrial pressure of 8 mmHg. FINDINGS  Left Ventricle: Left ventricular ejection fraction, by estimation, is <20%. The left ventricle has severely decreased function. The left ventricle demonstrates global hypokinesis. The left ventricular internal cavity size was normal in size. There is mild concentric left ventricular hypertrophy. Left ventricular diastolic parameters are indeterminate. Normal left ventricular filling pressure. Right Ventricle: The right ventricular size is mildly enlarged. No increase in right ventricular wall thickness. Right ventricular systolic function is severely reduced. There is mildly elevated pulmonary artery systolic pressure. The tricuspid regurgitant velocity is 2.50 m/s, and with an assumed right atrial pressure of 10 mmHg, the estimated right ventricular systolic pressure is 19.4 mmHg. Left Atrium: Left atrial size was normal in size. Right Atrium: Right atrial size was normal in size. Pericardium: There is no evidence of pericardial effusion. Mitral Valve: The mitral valve is grossly normal. Mild mitral annular calcification. Mild mitral valve regurgitation. Tricuspid Valve: The tricuspid valve is grossly normal. Tricuspid valve regurgitation is mild. Aortic Valve: The aortic valve was not well visualized. Aortic valve regurgitation is not visualized. No aortic stenosis is present. Pulmonic Valve: The pulmonic valve was not well visualized. Pulmonic valve regurgitation is not visualized. Aorta: The  aortic root is normal in size and structure. Venous: The inferior vena cava is normal in size with less than 50% respiratory variability, suggesting right atrial pressure of 8 mmHg. IAS/Shunts: The interatrial septum was not well visualized.  LEFT VENTRICLE PLAX 2D LVIDd:         4.93 cm  Diastology LVIDs:         4.73 cm  LV e' lateral:   6.32 cm/s LV PW:         1.28 cm  LV E/e' lateral: 5.9 LV IVS:        1.18 cm  LV e' medial:    3.64 cm/s LVOT diam:     2.00 cm  LV E/e' medial:  10.2 LV SV Index:   4.62 LVOT Area:     3.14 cm  RIGHT VENTRICLE RV S prime:     6.46 cm/s TAPSE (M-mode): 1.1 cm LEFT ATRIUM             Index       RIGHT ATRIUM           Index LA diam:        4.30 cm 1.97 cm/m  RA Area:     19.70 cm LA Vol (A2C):   65.6 ml 30.02 ml/m RA Volume:   58.50 ml  26.77 ml/m LA Vol (A4C):   59.9 ml 27.41 ml/m LA Biplane Vol: 64.4 ml 29.47 ml/m   AORTA Ao Root diam: 3.20 cm MITRAL VALVE               TRICUSPID VALVE MV Area (PHT): 2.20 cm    TR Peak grad:   25.0 mmHg MV Decel Time: 345 msec    TR Vmax:        250.00 cm/s Chris Peak grad: 20.4 mmHg Chris Vmax:      226.00 cm/s  SHUNTS MV E velocity: 37.20 cm/s  Systemic Diam: 2.00 cm MV A velocity: 17.10 cm/s MV E/A ratio:  2.18 Kate Sable MD Electronically signed by Kate Sable MD Signature Date/Time: 08/02/2019/12:35:04 PM    Final    [place] EKG SITE RITE  Result Date: 08/03/2019 If Site Rite image not attached, placement could not be confirmed due to current cardiac rhythm.     Medications:     Current Medications: . atorvastatin  10 mg Oral QPM  . Chlorhexidine Gluconate Cloth  6 each Topical Daily  . furosemide  80 mg Intravenous BID  . sodium chloride flush  3 mL Intravenous Q12H  . sodium chloride flush  3 mL Intravenous Q12H     Infusions: . sodium chloride    . sodium chloride    . sodium chloride 10 mL/hr at 08/03/19 0437  . amiodarone 30 mg/hr (08/03/19 0729)  . heparin 800 Units/hr (08/03/19 0023)       Assessment/Plan   1/ Acute Biventricular Heart Failure  ECHO with Biventricular HF LVEF< 20% RV severely reduced.  Volume status elevated. Continue IV lasix 80 mg twice a day.  Place PICC to guide diuresis and check CO-OX.  No bb for now. No ARB/dig/spiro with elevated creatinine.  Add ted hose.   2. New Atrial Fibrillation  On amio drip with controlled rate On heparin drip  Once diuresed will need cardioversion  3. AKI  Unclear baseline.  Creatinine trending up 1.5>1.9  May need to add inotropes.  4. Hyperlipidemia  Continue statin.    Length of Stay: 2  Darrick Grinder, NP  08/03/2019, 10:45 AM  Advanced Heart Failure Team Pager [phone] (M-F; [place])  Please contact [facility] for night-coverage after hours (4p -7a ) and weekends on [email or web address]  Patient seen with NP, agree with the above note.   Patient has prior history of HTN and hyperlipidemia.  He reports about 1 month of cough and dyspnea with exertion as well as progressive edema. No chest pain. He saw his PCP on 2/16 and was noted to be in atrial fibrillation with RVR, he was then sent to [facility] for admission.  Hs-TnI was flat at 25 => 27.  BNP elevated.  He was initially tried on diltiazem gtt but became hypotensive, now on amiodarone gtt.  Echo was done, showing EF < 20% with diffuse hypokinesis, severely decreased RV function.   General: NAD Neck: JVP 14+ cm,  no thyromegaly or thyroid nodule.  Lungs: Mild crackles at bases.  CV: Nondisplaced PMI.  Heart irregular S1/S2, no S3/S4, no murmur.  1+ edema 3/4 to knees bilaterally.  Abdomen: Soft, nontender, no hepatosplenomegaly, no distention.  Skin: Intact without lesions or rashes.  Neurologic: Alert and oriented x 3.  Psych: Normal affect. Extremities: No clubbing or cyanosis.  HEENT: Normal.   1. Acute systolic CHF: Echo this admission with EF <20%, severely decreased RV function.  Cardiomyopathy of uncertain etiology.  No prior echo or history  of CHF.  He has been symptomatic for about 1 month with edema and exertional dyspnea.  He was in atrial fibrillation with RVR at presentation.  Tachycardia-mediated cardiomyopathy is certainly a concern, uncertain of the duration of his atrial fibrillation/RVR.  Coronary disease is also a concern, ECG could be consistent with old anterior MI.  Currently, he is volume overloaded with SBP around 100 and rising creatinine to 1.9 with attempts at diuresis.  I am concerned for possible low output HF.  - Place PICC now and follow CVP and co-ox.  - If co-ox is low (as I expect), will start milrinone 0.25 mcg/kg/min (rate-controlled with amiodarone).  - Continue Lasix 80 mg IV bid for now, follow response once milrinone started (hopefully will pick up).  - Will add additional cardiomyopathy meds as we see how his renal function and BP fare with initial steps.  - If creatinine improves on milrinone, will plan LHC/RHC tomorrow morning.  Discussed risks/benefits with patient and he agrees to procedure.  - Once he is diuresed further, will plan TEE-guided DCCV (likely early next week).  2. Atrial fibrillation: Admitted with afib/RVR of uncertain duration.  Unsure if atrial fibrillation/RVR was cause of CMP (tachy-mediated) or an effect of the cardiomyopathy.  Regardless, he will need restoration of NSR.  - Continue amiodarone gtt for rate control.  - Continue heparin gtt for now, to apixaban after cath.  - Will plan TEE-guided DCCV after further diuresis.  3. AKI on ?CKD stage 3: Initial creatinine 1.5, up to 1.9 today with diuresis.  Suspect cardiorenal syndrome.  - As above, will likely start milrinone today.  Hopefully will help renal fxn.   Loralie Champagne 08/03/2019 11:37 AM

## 2019-08-03 NOTE — Plan of Care (Signed)
  Problem: Education: Goal: Knowledge of General Education information will improve Description: Including pain rating scale, medication(s)/side effects and non-pharmacologic comfort measures Outcome: Progressing   Problem: Activity: Goal: Risk for activity intolerance will decrease Outcome: Progressing   Problem: Coping: Goal: Level of anxiety will decrease Outcome: Progressing   Problem: Pain Managment: Goal: General experience of comfort will improve Outcome: Progressing   Problem: Safety: Goal: Ability to remain free from injury will improve Outcome: Progressing   Elesa Hacker, RN

## 2019-08-03 NOTE — Progress Notes (Signed)
Peripherally Inserted Central Catheter/Midline Placement  The IV Nurse has discussed with the patient and/or persons authorized to consent for the patient, the purpose of this procedure and the potential benefits and risks involved with this procedure.  The benefits include less needle sticks, lab draws from the catheter, and the patient may be discharged home with the catheter. Risks include, but not limited to, infection, bleeding, blood clot (thrombus formation), and puncture of an artery; nerve damage and irregular heartbeat and possibility to perform a PICC exchange if needed/ordered by physician.  Alternatives to this procedure were also discussed.  Bard Power PICC patient education guide, fact sheet on infection prevention and patient information card has been provided to patient /or left at bedside.    PICC/Midline Placement Documentation  PICC Double Lumen 58/00/63 PICC Right Basilic 40 cm 0 cm (Active)  Indication for Insertion or Continuance of Line Vasoactive infusions 08/03/19 1246  Exposed Catheter (cm) 0 cm 08/03/19 1246  Site Assessment Clean;Dry;Intact 08/03/19 1246  Lumen #1 Status Flushed;Saline locked;Blood return noted 08/03/19 1246  Lumen #2 Status Flushed;Saline locked;Blood return noted 08/03/19 1246  Dressing Type Transparent 08/03/19 1246  Dressing Status Clean;Dry;Intact;Antimicrobial disc in place 08/03/19 1246  Dressing Intervention New dressing 08/03/19 1246  Dressing Change Due 08/10/19 08/03/19 1246       Gordan Payment 08/03/2019, 12:47 PM

## 2019-08-03 NOTE — Progress Notes (Signed)
Patient has voided about 75mL of urine every twenty minutes x3.  Bladder scan completed max 262mL.  RN asked patient if he felt uncomfortable and/or has had urinary retention issues before.  Patient stated he has had retention issues previously but none recently and wanted to wait a bit longer prior to any possible urinary interventions.

## 2019-08-04 ENCOUNTER — Other Ambulatory Visit: Payer: Self-pay

## 2019-08-04 ENCOUNTER — Inpatient Hospital Stay (HOSPITAL_COMMUNITY)
Admission: EM | Disposition: A | Discharge status: Home Health | Payer: Self-pay | Source: Home / Self Care | Attending: Cardiovascular Disease

## 2019-08-04 DIAGNOSIS — I5043 Acute on chronic combined systolic (congestive) and diastolic (congestive) heart failure: Secondary | ICD-10-CM

## 2019-08-04 DIAGNOSIS — I429 Cardiomyopathy, unspecified: Secondary | ICD-10-CM

## 2019-08-04 DIAGNOSIS — I509 Heart failure, unspecified: Secondary | ICD-10-CM

## 2019-08-04 HISTORY — PX: RIGHT/LEFT HEART CATH AND CORONARY ANGIOGRAPHY: CATH118266

## 2019-08-04 LAB — POCT I-STAT EG7
Acid-base deficit: 1 mmol/L (ref 0.0–2.0)
Bicarbonate: 23.8 mmol/L (ref 20.0–28.0)
Bicarbonate: 25 mmol/L (ref 20.0–28.0)
Calcium, Ion: 1.05 mmol/L — ABNORMAL LOW (ref 1.15–1.40)
Calcium, Ion: 1.15 mmol/L (ref 1.15–1.40)
HCT: 38 % — ABNORMAL LOW (ref 39.0–52.0)
HCT: 40 % (ref 39.0–52.0)
Hemoglobin: 12.9 g/dL — ABNORMAL LOW (ref 13.0–17.0)
Hemoglobin: 13.6 g/dL (ref 13.0–17.0)
O2 Saturation: 74 %
O2 Saturation: 75 %
Potassium: 3.1 mmol/L — ABNORMAL LOW (ref 3.5–5.1)
Potassium: 3.3 mmol/L — ABNORMAL LOW (ref 3.5–5.1)
Sodium: 136 mmol/L (ref 135–145)
Sodium: 137 mmol/L (ref 135–145)
TCO2: 25 mmol/L (ref 22–32)
TCO2: 26 mmol/L (ref 22–32)
pCO2, Ven: 39.4 mmHg — ABNORMAL LOW (ref 44.0–60.0)
pCO2, Ven: 40.2 mmHg — ABNORMAL LOW (ref 44.0–60.0)
pH, Ven: 7.388 (ref 7.250–7.430)
pH, Ven: 7.401 (ref 7.250–7.430)
pO2, Ven: 39 mmHg (ref 32.0–45.0)
pO2, Ven: 41 mmHg (ref 32.0–45.0)

## 2019-08-04 LAB — HEPARIN LEVEL (UNFRACTIONATED): Heparin Unfractionated: 0.43 IU/mL (ref 0.30–0.70)

## 2019-08-04 LAB — URINALYSIS, ROUTINE W REFLEX MICROSCOPIC

## 2019-08-04 LAB — CBC
HCT: 41.8 % (ref 39.0–52.0)
Hemoglobin: 13.6 g/dL (ref 13.0–17.0)
MCH: 29.4 pg (ref 26.0–34.0)
MCHC: 32.5 g/dL (ref 30.0–36.0)
MCV: 90.5 fL (ref 80.0–100.0)
Platelets: 160 10*3/uL (ref 150–400)
RBC: 4.62 MIL/uL (ref 4.22–5.81)
RDW: 14.6 % (ref 11.5–15.5)
WBC: 7.6 10*3/uL (ref 4.0–10.5)
nRBC: 0 % (ref 0.0–0.2)

## 2019-08-04 LAB — BASIC METABOLIC PANEL
Anion gap: 16 — ABNORMAL HIGH (ref 5–15)
BUN: 24 mg/dL — ABNORMAL HIGH (ref 8–23)
CO2: 22 mmol/L (ref 22–32)
Calcium: 8.8 mg/dL — ABNORMAL LOW (ref 8.9–10.3)
Chloride: 99 mmol/L (ref 98–111)
Creatinine, Ser: 1.71 mg/dL — ABNORMAL HIGH (ref 0.61–1.24)
GFR calc Af Amer: 43 mL/min — ABNORMAL LOW (ref >60)
GFR calc non Af Amer: 37 mL/min — ABNORMAL LOW (ref >60)
Glucose, Bld: 150 mg/dL — ABNORMAL HIGH (ref 70–99)
Potassium: 3.4 mmol/L — ABNORMAL LOW (ref 3.5–5.1)
Sodium: 137 mmol/L (ref 135–145)

## 2019-08-04 LAB — COOXEMETRY PANEL
Carboxyhemoglobin: 0.6 % (ref 0.5–1.5)
Methemoglobin: 0.5 % (ref 0.0–1.5)
O2 Saturation: 66.3 %
Total hemoglobin: 13.8 g/dL (ref 12.0–16.0)

## 2019-08-04 LAB — MAGNESIUM: Magnesium: 1.8 mg/dL (ref 1.7–2.4)

## 2019-08-04 LAB — URINALYSIS, MICROSCOPIC (REFLEX): Squamous Epithelial / HPF: NONE SEEN (ref 0–5)

## 2019-08-04 SURGERY — RIGHT/LEFT HEART CATH AND CORONARY ANGIOGRAPHY
Anesthesia: LOCAL

## 2019-08-04 MED ORDER — HEPARIN SODIUM (PORCINE) 1000 UNIT/ML IJ SOLN
INTRAMUSCULAR | Status: DC | PRN
  Administered 2019-08-04: 4500 [IU] via INTRAVENOUS

## 2019-08-04 MED ORDER — LIDOCAINE HCL URETHRAL/MUCOSAL 2 % EX GEL
1.0000 "application " | Freq: Once | CUTANEOUS | Status: AC
  Administered 2019-08-04: 1 via URETHRAL
  Filled 2019-08-04 (×2): qty 20

## 2019-08-04 MED ORDER — HYDRALAZINE HCL 20 MG/ML IJ SOLN: 10.0000 mg | INTRAMUSCULAR | Status: AC | PRN

## 2019-08-04 MED ORDER — VERAPAMIL HCL 2.5 MG/ML IV SOLN
INTRAVENOUS | Status: DC | PRN
  Administered 2019-08-04: 10 mL via INTRA_ARTERIAL

## 2019-08-04 MED ORDER — SODIUM CHLORIDE 0.9 % IV SOLN: 250.0000 mL | INTRAVENOUS | Status: DC | PRN

## 2019-08-04 MED ORDER — ONDANSETRON HCL 4 MG/2ML IJ SOLN: 4.0000 mg | Freq: Four times a day (QID) | INTRAMUSCULAR | Status: DC | PRN

## 2019-08-04 MED ORDER — ACETAMINOPHEN 325 MG PO TABS
650.0000 mg | ORAL_TABLET | ORAL | Status: DC | PRN
  Filled 2019-08-04: qty 2

## 2019-08-04 MED ORDER — HEPARIN (PORCINE) IN NACL 1000-0.9 UT/500ML-% IV SOLN
INTRAVENOUS | Status: AC
  Filled 2019-08-04: qty 1000

## 2019-08-04 MED ORDER — IOHEXOL 350 MG/ML SOLN
INTRAVENOUS | Status: DC | PRN
  Administered 2019-08-04: 09:00:00 35 mL

## 2019-08-04 MED ORDER — HEPARIN SODIUM (PORCINE) 1000 UNIT/ML IJ SOLN
INTRAMUSCULAR | Status: AC
  Filled 2019-08-04: qty 1

## 2019-08-04 MED ORDER — SODIUM CHLORIDE 0.9% FLUSH: 3.0000 mL | INTRAVENOUS | Status: DC | PRN

## 2019-08-04 MED ORDER — FENTANYL CITRATE (PF) 100 MCG/2ML IJ SOLN
INTRAMUSCULAR | Status: DC | PRN
  Administered 2019-08-04: 25 ug via INTRAVENOUS

## 2019-08-04 MED ORDER — SODIUM CHLORIDE 0.9 % IV SOLN: INTRAVENOUS | Status: DC

## 2019-08-04 MED ORDER — HEPARIN (PORCINE) IN NACL 1000-0.9 UT/500ML-% IV SOLN
INTRAVENOUS | Status: DC | PRN
  Administered 2019-08-04 (×2): 500 mL

## 2019-08-04 MED ORDER — POTASSIUM CHLORIDE CRYS ER 20 MEQ PO TBCR
40.0000 meq | EXTENDED_RELEASE_TABLET | Freq: Once | ORAL | Status: AC
  Administered 2019-08-05: 40 meq via ORAL
  Filled 2019-08-04: qty 2

## 2019-08-04 MED ORDER — LIDOCAINE HCL (PF) 1 % IJ SOLN
INTRAMUSCULAR | Status: DC | PRN
  Administered 2019-08-04: 5 mL

## 2019-08-04 MED ORDER — FUROSEMIDE 10 MG/ML IJ SOLN
80.0000 mg | Freq: Once | INTRAMUSCULAR | Status: AC
  Administered 2019-08-04: 80 mg via INTRAVENOUS
  Filled 2019-08-04: qty 8

## 2019-08-04 MED ORDER — POTASSIUM CHLORIDE CRYS ER 20 MEQ PO TBCR
40.0000 meq | EXTENDED_RELEASE_TABLET | Freq: Once | ORAL | Status: AC
  Administered 2019-08-04: 40 meq via ORAL
  Filled 2019-08-04: qty 2

## 2019-08-04 MED ORDER — LABETALOL HCL 5 MG/ML IV SOLN: 10.0000 mg | INTRAVENOUS | Status: AC | PRN

## 2019-08-04 MED ORDER — SODIUM CHLORIDE 0.9% FLUSH: 3.0000 mL | Freq: Two times a day (BID) | INTRAVENOUS | Status: DC

## 2019-08-04 MED ORDER — MIDAZOLAM HCL 2 MG/2ML IJ SOLN
INTRAMUSCULAR | Status: DC | PRN
  Administered 2019-08-04: 1 mg via INTRAVENOUS

## 2019-08-04 MED ORDER — APIXABAN 2.5 MG PO TABS
2.5000 mg | ORAL_TABLET | Freq: Two times a day (BID) | ORAL | Status: DC
  Administered 2019-08-04 – 2019-08-07 (×6): 2.5 mg via ORAL
  Filled 2019-08-04 (×7): qty 1

## 2019-08-04 MED ORDER — FENTANYL CITRATE (PF) 100 MCG/2ML IJ SOLN
INTRAMUSCULAR | Status: AC
  Filled 2019-08-04: qty 2

## 2019-08-04 MED ORDER — SPIRONOLACTONE 12.5 MG HALF TABLET
12.5000 mg | ORAL_TABLET | Freq: Every day | ORAL | Status: DC
  Administered 2019-08-04 – 2019-08-08 (×4): 12.5 mg via ORAL
  Filled 2019-08-04 (×7): qty 1

## 2019-08-04 MED ORDER — VERAPAMIL HCL 2.5 MG/ML IV SOLN
INTRAVENOUS | Status: AC
  Filled 2019-08-04: qty 2

## 2019-08-04 MED ORDER — DIGOXIN 125 MCG PO TABS
0.1250 mg | ORAL_TABLET | Freq: Every day | ORAL | Status: DC
  Administered 2019-08-04 – 2019-08-09 (×6): 0.125 mg via ORAL
  Filled 2019-08-04 (×6): qty 1

## 2019-08-04 MED ORDER — SODIUM CHLORIDE 0.9 % IV SOLN
1.0000 g | Freq: Once | INTRAVENOUS | Status: AC
  Administered 2019-08-04: 1 g via INTRAVENOUS
  Filled 2019-08-04: qty 10

## 2019-08-04 MED ORDER — LIDOCAINE HCL (PF) 1 % IJ SOLN
INTRAMUSCULAR | Status: AC
  Filled 2019-08-04: qty 30

## 2019-08-04 MED ORDER — ASPIRIN 81 MG PO CHEW: 81.0000 mg | CHEWABLE_TABLET | ORAL | Status: DC

## 2019-08-04 MED ORDER — MIDAZOLAM HCL 2 MG/2ML IJ SOLN
INTRAMUSCULAR | Status: AC
  Filled 2019-08-04: qty 2

## 2019-08-04 MED ORDER — ASPIRIN 81 MG PO CHEW
81.0000 mg | CHEWABLE_TABLET | ORAL | Status: AC
  Administered 2019-08-04: 05:00:00 81 mg via ORAL
  Filled 2019-08-04: qty 1

## 2019-08-04 SURGICAL SUPPLY — 11 items
CATH 5FR JL3.5 JR4 ANG PIG MP (CATHETERS) ×1 IMPLANT
CATH BALLN WEDGE 5F 110CM (CATHETERS) ×1 IMPLANT
DEVICE RAD COMP TR BAND LRG (VASCULAR PRODUCTS) ×1 IMPLANT
GLIDESHEATH SLEND SS 6F .021 (SHEATH) ×1 IMPLANT
GUIDEWIRE INQWIRE 1.5J.035X260 (WIRE) IMPLANT
INQWIRE 1.5J .035X260CM (WIRE) ×2
KIT HEART LEFT (KITS) ×2 IMPLANT
PACK CARDIAC CATHETERIZATION (CUSTOM PROCEDURE TRAY) ×2 IMPLANT
SHEATH GLIDE SLENDER 4/5FR (SHEATH) ×1 IMPLANT
TRANSDUCER W/STOPCOCK (MISCELLANEOUS) ×2 IMPLANT
TUBING CIL FLEX 10 FLL-RA (TUBING) ×1 IMPLANT

## 2019-08-04 NOTE — Progress Notes (Signed)
Per Dr. Marletta Lor await Urology arrival prior to administering ordered IV antibiotic.

## 2019-08-04 NOTE — Progress Notes (Signed)
Patient ID: Chris Guerrero, male   DOB: 06-May-1941, 79 y.o.   MRN: [id number]     Advanced Heart Failure Rounding Note  PCP-Cardiologist: Kate Sable, MD   Subjective:    No complaints this morning.  Feels like his breathing is better.   Urinary retention overnight, difficult foley insertion by urology, has hematuria.   RHC/LHC:  Coronary Findings  Diagnostic Dominance: Right Left Main  No significant coronary disease.  Left Anterior Descending  No significant coronary disease.  Left Circumflex  No significant coronary disease.  Right Coronary Artery  No significant coronary disease.  Intervention  No interventions have been documented. Right Heart  Right Heart Pressures RHC Procedural Findings (milrinone 0.375): Hemodynamics (mmHg) RA mean 8 RV 38/8 PA 41/14, mean 29 PCWP mean 19 LV 76/14 AO 81/42  Oxygen saturations: PA 75% AO 98%  Cardiac Output (Fick) 6.44  Cardiac Index (Fick) 3.02 PVR 1.6 WU  30 cc contrast      Objective:   Weight Range: 97.5 kg Body mass index is 31.75 kg/m.   Vital Signs:   Temp:  [97.4 F (36.3 C)-98.3 F (36.8 C)] 98.3 F (36.8 C) (02/19 0335) Pulse Rate:  [66-109] 88 (02/19 0830) Resp:  [14-26] 15 (02/19 0830) BP: (102-139)/(55-93) 115/66 (02/19 0830) SpO2:  [95 %-99 %] 95 % (02/19 0830) Weight:  [97.5 kg] 97.5 kg (02/19 0330) Last BM Date: 08/03/19  Weight change: Filed Weights   08/02/19 2145 08/03/19 0157 08/04/19 0330  Weight: 98.1 kg 98.1 kg 97.5 kg    Intake/Output:   Intake/Output Summary (Last 24 hours) at 08/04/2019 0837 Last data filed at 08/04/2019 0655 Gross per 24 hour  Intake 630 ml  Output 1650 ml  Net -1020 ml      Physical Exam    General:  Well appearing. No resp difficulty HEENT: Normal Neck: Supple. JVP 9-10. Carotids 2+ bilat; no bruits. No lymphadenopathy or thyromegaly appreciated. Cor: PMI nondisplaced. Irregular rate & rhythm. No rubs, gallops or murmurs. Lungs:  Clear Abdomen: Soft, nontender, nondistended. No hepatosplenomegaly. No bruits or masses. Good bowel sounds. Extremities: No cyanosis, clubbing, rash. Trace ankle edema.  Neuro: Alert & orientedx3, cranial nerves grossly intact. moves all 4 extremities w/o difficulty. Affect pleasant   Telemetry   Atrial fibrillation 90s-100s  Labs    CBC Recent Labs    08/01/19 1721 08/01/19 1721 08/02/19 0234 08/04/19 0457  WBC 6.5   < > 5.8 7.6  NEUTROABS 4.1  --   --   --   HGB 14.4   < > 14.1 13.6  HCT 44.9   < > 44.1 41.8  MCV 92.4   < > 93.0 90.5  PLT 157   < > 152 160   < > = values in this interval not displayed.   Basic Metabolic Panel Recent Labs    08/02/19 0234 08/02/19 1448 08/03/19 0643 08/04/19 0457  NA 136   < > 137 137  K 3.6   < > 4.1 3.4*  CL 102   < > 101 99  CO2 24   < > 21* 22  GLUCOSE 124*   < > 141* 150*  BUN 25*   < > 27* 24*  CREATININE 1.52*   < > 1.91* 1.71*  CALCIUM 8.8*   < > 8.8* 8.8*  MG 1.8  --   --  1.8  PHOS 4.0  --   --   --    < > = values in this interval  not displayed.   Liver Function Tests Recent Labs    08/01/19 1721 08/02/19 1448  AST 31 30  ALT 22 23  ALKPHOS 103 99  BILITOT 1.7* 1.4*  PROT 6.5 6.3*  ALBUMIN 3.5 3.5   No results for input(s): LIPASE, AMYLASE in the last 72 hours. Cardiac Enzymes No results for input(s): CKTOTAL, CKMB, CKMBINDEX, TROPONINI in the last 72 hours.  BNP: BNP (last 3 results) Recent Labs    08/01/19 1721  BNP 1,068.0*    ProBNP (last 3 results) No results for input(s): PROBNP in the last 8760 hours.   D-Dimer No results for input(s): DDIMER in the last 72 hours. Hemoglobin A1C No results for input(s): HGBA1C in the last 72 hours. Fasting Lipid Panel No results for input(s): CHOL, HDL, LDLCALC, TRIG, CHOLHDL, LDLDIRECT in the last 72 hours. Thyroid Function Tests No results for input(s): TSH, T4TOTAL, T3FREE, THYROIDAB in the last 72 hours.  Invalid input(s): FREET3  Other  results:   Imaging    CARDIAC CATHETERIZATION  Result Date: 08/04/2019 1. Mildly elevated filling pressures.  2. Excellent cardiac output on milrinone. 3. No significant coronary disease. Nonischemic cardiomyopathy.   DG CHEST PORT 1 VIEW  Result Date: 08/03/2019 CLINICAL DATA:  Shortness of breath. PICC placement. EXAM: PORTABLE CHEST 1 VIEW COMPARISON:  08/02/2019 and 08/01/2019 FINDINGS: PICC is been inserted and the tip is in the superior vena cava at the level of the carina. Heart size and vascularity are normal. Persistent small right effusion. Slight increased small left effusion. No bone abnormality. Aortic atherosclerosis. IMPRESSION: 1. PICC tip in the superior vena cava at the level of the carina. 2. Slight increased small left effusion. Persistent small right effusion. 3.  Aortic Atherosclerosis (ICD10-I70.0). Electronically Signed   By: Lorriane Shire M.D.   On: 08/03/2019 13:07   [place] EKG SITE RITE  Result Date: 08/03/2019 If Site Rite image not attached, placement could not be confirmed due to current cardiac rhythm.     Medications:     Scheduled Medications: . [MAR Hold] atorvastatin  10 mg Oral QPM  . [MAR Hold] Chlorhexidine Gluconate Cloth  6 each Topical Daily  . digoxin  0.125 mg Oral Daily  . furosemide  80 mg Intravenous Once  . potassium chloride  40 mEq Oral Once  . [MAR Hold] sodium chloride flush  10-40 mL Intracatheter Q12H  . [MAR Hold] sodium chloride flush  3 mL Intravenous Q12H  . [MAR Hold] sodium chloride flush  3 mL Intravenous Q12H  . [MAR Hold] sodium chloride flush  3 mL Intravenous Q12H  . spironolactone  12.5 mg Oral Daily     Infusions: . [MAR Hold] sodium chloride    . sodium chloride    . sodium chloride 10 mL/hr at 08/03/19 0437  . sodium chloride    . sodium chloride 10 mL/hr at 08/04/19 0354  . amiodarone 30 mg/hr (08/04/19 0119)  . milrinone 0.25 mcg/kg/min (08/04/19 0832)     PRN Medications:  [MAR Hold] sodium  chloride, sodium chloride, sodium chloride, [MAR Hold] acetaminophen, fentaNYL, Heparin (Porcine) in NaCl, heparin, iohexol, lidocaine (PF), midazolam, Radial Cocktail/Verapamil only, [MAR Hold] sodium chloride flush, [MAR Hold] sodium chloride flush, sodium chloride flush, sodium chloride flush    Assessment/Plan   1. Acute systolic CHF: Echo this admission with EF <20%, severely decreased RV function. Nonischemic cardiomyopathy, ?tachycardia-mediated.  No prior echo or history of CHF.  He has been symptomatic for about 1 month with edema and exertional dyspnea.  He was in atrial fibrillation with RVR at presentation.  Milrinone started with low co-ox.  He is doing better on milrinone this morning, co-ox is higher and creatinine down to 1.7.  RHC/LHC today with no coronary disease, mildly elevated filling pressures.  - I will give 1 dose of Lasix 80 mg IV around noon today, reassess CVP from PICC tomorrow.  - Add digoxin 0.125 daily.  - Add spironolactone 12.5 daily.   - Decrease milrinone to 0.25 mcg/kg/min.  - With hematuria, will not restart anticoagulation (apixaban) until late this evening.    - TEE-guided DCCV on Monday.  2. Atrial fibrillation: Admitted with afib/RVR of uncertain duration.  Unsure if atrial fibrillation/RVR was cause of CMP (tachy-mediated) or an effect of the cardiomyopathy.  Regardless, he will need restoration of NSR.  - Continue amiodarone gtt for rate control.  - Start apixaban this evening if hematuria has calmed.  - Will plan TEE-guided DCCV Monday.  3. AKI on ?CKD stage 3: Initial creatinine 1.5, up to 1.9 initially and now 1.7.  Suspect cardiorenal syndrome.  4. Hematuria: Difficult foley insertion with BPH.   - Hold anticoagulation until tonight.  - Got 1 dose ceftriaxone for prophylaxis.   Length of Stay: 3  Loralie Champagne, MD  08/04/2019, 8:37 AM  Advanced Heart Failure Team Pager [phone] (M-F; 7a - 4p)  Please contact [facility] for  night-coverage after hours (4p -7a ) and weekends on [email or web address]

## 2019-08-04 NOTE — Progress Notes (Signed)
ANTICOAGULATION CONSULT NOTE -   Pharmacy Consult for Heparin > Eliquis Indication: atrial fibrillation  No Known Allergies  Patient Measurements: Height: 5\' 9"  (175.3 cm) Weight: 215 lb (97.5 kg) IBW/kg (Calculated) : 70.7 Heparin Dosing Weight: 91 kg  Vital Signs: Temp: 97.8 F (36.6 C) (02/19 1142) Temp Source: Oral (02/19 1142) BP: 115/102 (02/19 1142) Pulse Rate: 98 (02/19 1142)  Labs: Recent Labs    08/01/19 1721 08/01/19 1721 08/01/19 1932 08/02/19 0234 08/02/19 0234 08/02/19 1448 08/02/19 2309 08/03/19 0643 08/03/19 0853 08/03/19 2011 08/04/19 0457  HGB 14.4   < >  --  14.1  --   --   --   --   --   --  13.6  HCT 44.9  --   --  44.1  --   --   --   --   --   --  41.8  PLT 157  --   --  152  --   --   --   --   --   --  160  APTT 30  --   --   --   --   --   --   --   --   --   --   LABPROT 14.0  --   --   --   --   --   --   --   --   --   --   INR 1.1  --   --   --   --   --   --   --   --   --   --   HEPARINUNFRC  --   --   --  1.25*   < > 0.93*   < >  --  0.17* 0.30 0.43  CREATININE 1.45*   < >  --  1.52*   < > 1.52*  --  1.91*  --   --  1.71*  TROPONINIHS 25*  --  27*  --   --   --   --   --   --   --   --    < > = values in this interval not displayed.    Estimated Creatinine Clearance: 39.7 mL/min (A) (by C-G formula based on SCr of 1.71 mg/dL (H)).   Medical History: Past Medical History:  Diagnosis Date  . Hyperlipemia   . Hypertension      Assessment: 54 yoM with new onset AFib and HF started on IV heparin. CHADSVASc = 4, no AC PTA. Heparin level subtherapeutic this morning after several rate reductions, no CBC.  Pharmacy asked to transition to Eliquis this evening.  Since age = 6 and Scr > 1.5, will need reduced dosage.  Goal of Therapy:  Heparin level 0.3-0.7 units/ml Monitor platelets by anticoagulation protocol: Yes   Plan:  Start Eliquis 2.5 mg BID tonight at 8 pm.  Will need education prior to discharge.  Marguerite Olea, [facility] Clinical Pharmacist Phone [phone]  08/04/2019 2:22 PM

## 2019-08-04 NOTE — Consult Note (Signed)
Urology Consult  Referring physician: Dr. Marletta Lor Reason for referral: urinary retention  Chief Complaint: suprapubic pain  History of Present Illness: Chris Guerrero is a 79yo with a hx of BPH followed by Dr. Karsten Ro at [facility] who was admitted with CHF. He was started on lasix and began to have difficulty urinating. Early this morning he was unable to urinate and had over 500cc on bladder scan. Multiple attempts were made by the nursing staff to place a foley which were unsuccessful. He is on doxazosin for his BPH.   Past Medical History:  Diagnosis Date  . Hyperlipemia   . Hypertension    Past Surgical History:  Procedure Laterality Date  . CHOLECYSTECTOMY    . COLONOSCOPY  09/04/2010   RMR: diverticula, cecal polyp = tubular adenoma; Repeat in 5 years  . COLONOSCOPY N/A 10/10/2015   Procedure: COLONOSCOPY;  Surgeon: Daneil Dolin, MD;  Location: [facility];  Service: Endoscopy;  Laterality: N/A;  1300  . HERNIA REPAIR      Medications: I have reviewed the patient's current medications. Allergies: No Known Allergies  Family History  Problem Relation Age of Onset  . Colon cancer Neg Hx    Social History:  reports that he has never smoked. His smokeless tobacco use includes chew. He reports previous alcohol use. He reports that he does not use drugs.  Review of Systems  Genitourinary: Positive for difficulty urinating and hematuria.    Physical Exam:  Vital signs in last 24 hours: Temp:  [97.4 F (36.3 C)-98.3 F (36.8 C)] 98.3 F (36.8 C) (02/19 0335) Pulse Rate:  [66-102] 68 (02/19 0335) Resp:  [0-26] 25 (02/19 0335) BP: (102-128)/(71-93) 128/85 (02/19 0335) SpO2:  [96 %-97 %] 97 % (02/19 0335) Weight:  [97.5 kg] 97.5 kg (02/19 0330) Physical Exam  Constitutional: He is oriented to person, place, and time. He appears well-developed and well-nourished.  HENT:  Head: Normocephalic and atraumatic.  Eyes: Pupils are equal, round, and reactive to light. EOM are normal.   Neck: No thyromegaly present.  Cardiovascular: Normal rate and regular rhythm.  Respiratory: Effort normal. No respiratory distress.  GI: Soft. He exhibits distension. There is abdominal tenderness. Hernia confirmed negative in the right inguinal area and confirmed negative in the left inguinal area.  Genitourinary:    Testes, penis and rectum normal.  Prostate is enlarged. Circumcised.  Musculoskeletal:        General: Edema present. Normal range of motion.     Cervical back: Normal range of motion.  Lymphadenopathy:       Right: No inguinal adenopathy present.       Left: No inguinal adenopathy present.  Neurological: He is alert and oriented to person, place, and time.  Skin: Skin is warm and dry.  Psychiatric: He has a normal mood and affect. His behavior is normal. Judgment and thought content normal.    Laboratory Data:  Results for orders placed or performed during the hospital encounter of 08/01/19 (from the past 72 hour(s))  Comprehensive metabolic panel     Status: Abnormal   Collection Time: 08/01/19  5:21 PM  Result Value Ref Range   Sodium 138 135 - 145 mmol/L   Potassium 3.6 3.5 - 5.1 mmol/L   Chloride 104 98 - 111 mmol/L   CO2 23 22 - 32 mmol/L   Glucose, Bld 109 (H) 70 - 99 mg/dL   BUN 26 (H) 8 - 23 mg/dL   Creatinine, Ser 1.45 (H) 0.61 - 1.24 mg/dL  Calcium 8.9 8.9 - 10.3 mg/dL   Total Protein 6.5 6.5 - 8.1 g/dL   Albumin 3.5 3.5 - 5.0 g/dL   AST 31 15 - 41 U/L   ALT 22 0 - 44 U/L   Alkaline Phosphatase 103 38 - 126 U/L   Total Bilirubin 1.7 (H) 0.3 - 1.2 mg/dL   GFR calc non Af Amer 45 (L) >60 mL/min   GFR calc Af Amer 52 (L) >60 mL/min   Anion gap 11 5 - 15    Comment: Performed at [facility], [address], [place], [place] [address]  Brain natriuretic peptide     Status: Abnormal   Collection Time: 08/01/19  5:21 PM  Result Value Ref Range   B Natriuretic Peptide 1,068.0 (H) 0.0 - 100.0 pg/mL    Comment: Performed at [facility], [address] [place], [place] [address]  Troponin I (High Sensitivity)     Status: Abnormal   Collection Time: 08/01/19  5:21 PM  Result Value Ref Range   Troponin I (High Sensitivity) 25 (H) <18 ng/L    Comment: (NOTE) Elevated high sensitivity troponin I (hsTnI) values and significant  changes across serial measurements may suggest ACS but many other  chronic and acute conditions are known to elevate hsTnI results.  Refer to the "Links" section for chest pain algorithms and additional  guidance. Performed at [facility], [address], [place], [place] [address]   CBC with Differential     Status: None   Collection Time: 08/01/19  5:21 PM  Result Value Ref Range   WBC 6.5 4.0 - 10.5 K/uL   RBC 4.86 4.22 - 5.81 MIL/uL   Hemoglobin 14.4 13.0 - 17.0 g/dL   HCT 44.9 39.0 - 52.0 %   MCV 92.4 80.0 - 100.0 fL   MCH 29.6 26.0 - 34.0 pg   MCHC 32.1 30.0 - 36.0 g/dL   RDW 14.6 11.5 - 15.5 %   Platelets 157 150 - 400 K/uL   nRBC 0.0 0.0 - 0.2 %   Neutrophils Relative % 62 %   Neutro Abs 4.1 1.7 - 7.7 K/uL   Lymphocytes Relative 23 %   Lymphs Abs 1.5 0.7 - 4.0 K/uL   Monocytes Relative 11 %   Monocytes Absolute 0.7 0.1 - 1.0 K/uL   Eosinophils Relative 2 %   Eosinophils Absolute 0.1 0.0 - 0.5 K/uL   Basophils Relative 1 %   Basophils Absolute 0.0 0.0 - 0.1 K/uL   Immature Granulocytes 1 %   Abs Immature Granulocytes 0.03 0.00 - 0.07 K/uL    Comment: Performed at [facility], [address], [place], [place] [address]  Urinalysis, Routine w reflex microscopic     Status: None   Collection Time: 08/01/19  5:21 PM  Result Value Ref Range   Color, Urine YELLOW YELLOW   APPearance CLEAR CLEAR   Specific Gravity, Urine 1.008 1.005 - 1.030   pH 5.0 5.0 - 8.0   Glucose, UA NEGATIVE NEGATIVE mg/dL   Hgb urine dipstick NEGATIVE NEGATIVE   Bilirubin Urine NEGATIVE NEGATIVE   Ketones, ur NEGATIVE NEGATIVE mg/dL   Protein, ur NEGATIVE NEGATIVE mg/dL   Nitrite NEGATIVE NEGATIVE   Leukocytes,Ua  NEGATIVE NEGATIVE    Comment: Performed at [facility], [address], [place],  [address]  APTT     Status: None   Collection Time: 08/01/19  5:21 PM  Result Value Ref Range   aPTT 30 24 - 36 seconds  Comment: Performed at [facility], [address], [place], [place] [address]  Protime-INR     Status: None   Collection Time: 08/01/19  5:21 PM  Result Value Ref Range   Prothrombin Time 14.0 11.4 - 15.2 seconds   INR 1.1 0.8 - 1.2    Comment: (NOTE) INR goal varies based on device and disease states. Performed at [facility], [address], [place], [place] [address]   Respiratory Panel by RT PCR (Flu A&B, Covid) - Nasopharyngeal Swab     Status: None   Collection Time: 08/01/19  7:18 PM   Specimen: Nasopharyngeal Swab  Result Value Ref Range   SARS Coronavirus 2 by RT PCR NEGATIVE NEGATIVE    Comment: (NOTE) SARS-CoV-2 target nucleic acids are NOT DETECTED. The SARS-CoV-2 RNA is generally detectable in upper respiratoy specimens during the acute phase of infection. The lowest concentration of SARS-CoV-2 viral copies this assay can detect is 131 copies/mL. A negative result does not preclude SARS-Cov-2 infection and should not be used as the sole basis for treatment or other patient management decisions. A negative result may occur with  improper specimen collection/handling, submission of specimen other than nasopharyngeal swab, presence of viral mutation(s) within the areas targeted by this assay, and inadequate number of viral copies (<131 copies/mL). A negative result must be combined with clinical observations, patient history, and epidemiological information. The expected result is Negative. Fact Sheet for Patients:  PinkCheek.be Fact Sheet for Healthcare Providers:  GravelBags.it This test is not yet ap proved or cleared by the [place] FDA and  has been authorized for detection and/or diagnosis of  SARS-CoV-2 by FDA under an Emergency Use Authorization (EUA). This EUA will remain  in effect (meaning this test can be used) for the duration of the COVID-19 declaration under Section 564(b)(1) of the Act, 21 U.S.C. section 360bbb-3(b)(1), unless the authorization is terminated or revoked sooner.    Influenza A by PCR NEGATIVE NEGATIVE   Influenza B by PCR NEGATIVE NEGATIVE    Comment: (NOTE) The Xpert Xpress SARS-CoV-2/FLU/RSV assay is intended as an aid in  the diagnosis of influenza from Nasopharyngeal swab specimens and  should not be used as a sole basis for treatment. Nasal washings and  aspirates are unacceptable for Xpert Xpress SARS-CoV-2/FLU/RSV  testing. Fact Sheet for Patients: PinkCheek.be Fact Sheet for Healthcare Providers: GravelBags.it This test is not yet approved or cleared by the [place] FDA and  has been authorized for detection and/or diagnosis of SARS-CoV-2 by  FDA under an Emergency Use Authorization (EUA). This EUA will remain  in effect (meaning this test can be used) for the duration of the  Covid-19 declaration under Section 564(b)(1) of the Act, 21  U.S.C. section 360bbb-3(b)(1), unless the authorization is  terminated or revoked. Performed at [facility], [address] [place], [place] [address]   Troponin I (High Sensitivity)     Status: Abnormal   Collection Time: 08/01/19  7:32 PM  Result Value Ref Range   Troponin I (High Sensitivity) 27 (H) <18 ng/L    Comment: (NOTE) Elevated high sensitivity troponin I (hsTnI) values and significant  changes across serial measurements may suggest ACS but many other  chronic and acute conditions are known to elevate hsTnI results.  Refer to the "Links" section for chest pain algorithms and additional  guidance. Performed at [facility], [address], [place], [place] [address]   CBC     Status: None   Collection Time: 08/02/19  2:34 AM   Result Value Ref Range   WBC 5.8 4.0 - 10.5 K/uL   RBC 4.74 4.22 - 5.81 MIL/uL   Hemoglobin 14.1 13.0 - 17.0 g/dL   HCT 44.1 39.0 - 52.0 %   MCV 93.0 80.0 - 100.0 fL   MCH 29.7 26.0 - 34.0 pg   MCHC 32.0 30.0 - 36.0 g/dL   RDW 14.6 11.5 - 15.5 %   Platelets 152 150 - 400 K/uL   nRBC 0.0 0.0 - 0.2 %    Comment: Performed at [facility], [address], [place], [place] [address]  Heparin level (unfractionated)     Status: Abnormal   Collection Time: 08/02/19  2:34 AM  Result Value Ref Range   Heparin Unfractionated 1.25 (H) 0.30 - 0.70 IU/mL    Comment: RESULTS CONFIRMED BY MANUAL DILUTION (NOTE) If heparin results are below expected values, and patient dosage has  been confirmed, suggest follow up testing of antithrombin III levels. Performed at [facility], [address], [place], [place] [address]   Basic metabolic panel     Status: Abnormal   Collection Time: 08/02/19  2:34 AM  Result Value Ref Range   Sodium 136 135 - 145 mmol/L   Potassium 3.6 3.5 - 5.1 mmol/L   Chloride 102 98 - 111 mmol/L   CO2 24 22 - 32 mmol/L   Glucose, Bld 124 (H) 70 - 99 mg/dL   BUN 25 (H) 8 - 23 mg/dL   Creatinine, Ser 1.52 (H) 0.61 - 1.24 mg/dL   Calcium 8.8 (L) 8.9 - 10.3 mg/dL   GFR calc non Af Amer 43 (L) >60 mL/min   GFR calc Af Amer 49 (L) >60 mL/min   Anion gap 10 5 - 15    Comment: Performed at [facility], [address], [place], [place] [address]  Magnesium     Status: None   Collection Time: 08/02/19  2:34 AM  Result Value Ref Range   Magnesium 1.8 1.7 - 2.4 mg/dL    Comment: Performed at [facility], [address] [place], [place] [address]  Phosphorus     Status: None   Collection Time: 08/02/19  2:34 AM  Result Value Ref Range   Phosphorus 4.0 2.5 - 4.6 mg/dL    Comment: Performed at [facility], [address], [place], [place] [address]  Heparin level (unfractionated)     Status: Abnormal   Collection Time: 08/02/19  2:48 PM  Result Value Ref Range   Heparin  Unfractionated 0.93 (H) 0.30 - 0.70 IU/mL    Comment: (NOTE) If heparin results are below expected values, and patient dosage has  been confirmed, suggest follow up testing of antithrombin III levels. Performed at [facility], [address], [place], [place] [address]   Comprehensive metabolic panel     Status: Abnormal   Collection Time: 08/02/19  2:48 PM  Result Value Ref Range   Sodium 136 135 - 145 mmol/L   Potassium 3.8 3.5 - 5.1 mmol/L   Chloride 103 98 - 111 mmol/L   CO2 22 22 - 32 mmol/L   Glucose, Bld 161 (H) 70 - 99 mg/dL   BUN 27 (H) 8 - 23 mg/dL   Creatinine, Ser 1.52 (H) 0.61 - 1.24 mg/dL   Calcium 8.8 (L) 8.9 - 10.3 mg/dL   Total Protein 6.3 (L) 6.5 - 8.1 g/dL   Albumin 3.5 3.5 - 5.0 g/dL   AST 30 15 - 41 U/L   ALT  23 0 - 44 U/L   Alkaline Phosphatase 99 38 - 126 U/L   Total Bilirubin 1.4 (H) 0.3 - 1.2 mg/dL   GFR calc non Af Amer 43 (L) >60 mL/min   GFR calc Af Amer 49 (L) >60 mL/min   Anion gap 11 5 - 15    Comment: Performed at [facility], [address], [place], [place] [address]  MRSA PCR Screening     Status: None   Collection Time: 08/02/19  9:00 PM   Specimen: Nasopharyngeal  Result Value Ref Range   MRSA by PCR NEGATIVE NEGATIVE    Comment:        The GeneXpert MRSA Assay (FDA approved for NASAL specimens only), is one component of a comprehensive MRSA colonization surveillance program. It is not intended to diagnose MRSA infection nor to guide or monitor treatment for MRSA infections. Performed at [facility], [address], [place], [place] [address]   Heparin level (unfractionated)     Status: Abnormal   Collection Time: 08/02/19 11:09 PM  Result Value Ref Range   Heparin Unfractionated 0.88 (H) 0.30 - 0.70 IU/mL    Comment: (NOTE) If heparin results are below expected values, and patient dosage has  been confirmed, suggest follow up testing of antithrombin III levels. Performed at [facility], [address], [place], [place]  [address]   Basic metabolic panel     Status: Abnormal   Collection Time: 08/03/19  6:43 AM  Result Value Ref Range   Sodium 137 135 - 145 mmol/L   Potassium 4.1 3.5 - 5.1 mmol/L   Chloride 101 98 - 111 mmol/L   CO2 21 (L) 22 - 32 mmol/L   Glucose, Bld 141 (H) 70 - 99 mg/dL   BUN 27 (H) 8 - 23 mg/dL   Creatinine, Ser 1.91 (H) 0.61 - 1.24 mg/dL   Calcium 8.8 (L) 8.9 - 10.3 mg/dL   GFR calc non Af Amer 32 (L) >60 mL/min   GFR calc Af Amer 38 (L) >60 mL/min   Anion gap 15 5 - 15    Comment: Performed at [facility] [address], [place], [place] [address]  Heparin level (unfractionated)     Status: Abnormal   Collection Time: 08/03/19  8:53 AM  Result Value Ref Range   Heparin Unfractionated 0.17 (L) 0.30 - 0.70 IU/mL    Comment: (NOTE) If heparin results are below expected values, and patient dosage has  been confirmed, suggest follow up testing of antithrombin III levels. Performed at [facility], [place] [address], [place], [place] [address]   Cooxemetry Panel (carboxy, met, total hgb, O2 sat)     Status: None   Collection Time: 08/03/19  3:07 PM  Result Value Ref Range   Total hemoglobin 15.0 12.0 - 16.0 g/dL   O2 Saturation 45.8 %   Carboxyhemoglobin 0.8 0.5 - 1.5 %   Methemoglobin 0.8 0.0 - 1.5 %    Comment: Performed at [facility] [address] [place], [place] [address]  Cooxemetry Panel (carboxy, met, total hgb, O2 sat)     Status: None   Collection Time: 08/03/19  7:11 PM  Result Value Ref Range   Total hemoglobin 13.4 12.0 - 16.0 g/dL   O2 Saturation 61.9 %   Carboxyhemoglobin 1.1 0.5 - 1.5 %   Methemoglobin 0.9 0.0 - 1.5 %    Comment: Performed at [place] [address] [place], [place] [address]  Heparin level (unfractionated)     Status: None   Collection Time: 08/03/19  8:11 PM  Result Value Ref Range   Heparin Unfractionated 0.30 0.30 - 0.70 IU/mL    Comment: (NOTE) If heparin results are below expected values, and patient  dosage has  been confirmed, suggest follow up testing of antithrombin III levels. Performed at [facility], [place] [address], [place], [place] [address]   Cooxemetry Panel (carboxy, met, total hgb, O2 sat)     Status: None   Collection Time: 08/04/19  4:40 AM  Result Value Ref Range   Total hemoglobin 13.8 12.0 - 16.0 g/dL   O2 Saturation 66.3 %   Carboxyhemoglobin 0.6 0.5 - 1.5 %   Methemoglobin 0.5 0.0 - 1.5 %    Comment: Performed at [facility] [place] [address], [place], [place] [address]  CBC     Status: None   Collection Time: 08/04/19  4:57 AM  Result Value Ref Range   WBC 7.6 4.0 - 10.5 K/uL   RBC 4.62 4.22 - 5.81 MIL/uL   Hemoglobin 13.6 13.0 - 17.0 g/dL   HCT 41.8 39.0 - 52.0 %   MCV 90.5 80.0 - 100.0 fL   MCH 29.4 26.0 - 34.0 pg   MCHC 32.5 30.0 - 36.0 g/dL   RDW 14.6 11.5 - 15.5 %   Platelets 160 150 - 400 K/uL   nRBC 0.0 0.0 - 0.2 %    Comment: Performed at [facility], [place] [address] [place], [place] [address]  Basic metabolic panel     Status: Abnormal   Collection Time: 08/04/19  4:57 AM  Result Value Ref Range   Sodium 137 135 - 145 mmol/L   Potassium 3.4 (L) 3.5 - 5.1 mmol/L   Chloride 99 98 - 111 mmol/L   CO2 22 22 - 32 mmol/L   Glucose, Bld 150 (H) 70 - 99 mg/dL   BUN 24 (H) 8 - 23 mg/dL   Creatinine, Ser 1.71 (H) 0.61 - 1.24 mg/dL   Calcium 8.8 (L) 8.9 - 10.3 mg/dL   GFR calc non Af Amer 37 (L) >60 mL/min   GFR calc Af Amer 43 (L) >60 mL/min   Anion gap 16 (H) 5 - 15    Comment: Performed at [facility], [place] [address] [place], [place] [address]  Magnesium     Status: None   Collection Time: 08/04/19  4:57 AM  Result Value Ref Range   Magnesium 1.8 1.7 - 2.4 mg/dL    Comment: Performed at [facility] [address], [place], [place] [address]  Heparin level (unfractionated)     Status: None   Collection Time: 08/04/19  4:57 AM  Result Value Ref Range   Heparin Unfractionated 0.43 0.30 - 0.70 IU/mL    Comment:  (NOTE) If heparin results are below expected values, and patient dosage has  been confirmed, suggest follow up testing of antithrombin III levels. Performed at [facility], [place] [address], [place], [place] [address]    Recent Results (from the past 240 hour(s))  Respiratory Panel by RT PCR (Flu A&B, Covid) - Nasopharyngeal Swab     Status: None   Collection Time: 08/01/19  7:18 PM   Specimen: Nasopharyngeal Swab  Result Value Ref Range Status   SARS Coronavirus 2 by RT PCR NEGATIVE NEGATIVE Final    Comment: (NOTE) SARS-CoV-2 target nucleic acids are NOT DETECTED. The SARS-CoV-2 RNA  is generally detectable in upper respiratoy specimens during the acute phase of infection. The lowest concentration of SARS-CoV-2 viral copies this assay can detect is 131 copies/mL. A negative result does not preclude SARS-Cov-2 infection and should not be used as the sole basis for treatment or other patient management decisions. A negative result may occur with  improper specimen collection/handling, submission of specimen other than nasopharyngeal swab, presence of viral mutation(s) within the areas targeted by this assay, and inadequate number of viral copies (<131 copies/mL). A negative result must be combined with clinical observations, patient history, and epidemiological information. The expected result is Negative. Fact Sheet for Patients:  PinkCheek.be Fact Sheet for Healthcare Providers:  GravelBags.it This test is not yet ap proved or cleared by the [place] FDA and  has been authorized for detection and/or diagnosis of SARS-CoV-2 by FDA under an Emergency Use Authorization (EUA). This EUA will remain  in effect (meaning this test can be used) for the duration of the COVID-19 declaration under Section 564(b)(1) of the Act, 21 U.S.C. section 360bbb-3(b)(1), unless the authorization is terminated or revoked sooner.     Influenza A by PCR NEGATIVE NEGATIVE Final   Influenza B by PCR NEGATIVE NEGATIVE Final    Comment: (NOTE) The Xpert Xpress SARS-CoV-2/FLU/RSV assay is intended as an aid in  the diagnosis of influenza from Nasopharyngeal swab specimens and  should not be used as a sole basis for treatment. Nasal washings and  aspirates are unacceptable for Xpert Xpress SARS-CoV-2/FLU/RSV  testing. Fact Sheet for Patients: PinkCheek.be Fact Sheet for Healthcare Providers: GravelBags.it This test is not yet approved or cleared by the [place] FDA and  has been authorized for detection and/or diagnosis of SARS-CoV-2 by  FDA under an Emergency Use Authorization (EUA). This EUA will remain  in effect (meaning this test can be used) for the duration of the  Covid-19 declaration under Section 564(b)(1) of the Act, 21  U.S.C. section 360bbb-3(b)(1), unless the authorization is  terminated or revoked. Performed at [facility], [address] [place], [place] [address]   MRSA PCR Screening     Status: None   Collection Time: 08/02/19  9:00 PM   Specimen: Nasopharyngeal  Result Value Ref Range Status   MRSA by PCR NEGATIVE NEGATIVE Final    Comment:        The GeneXpert MRSA Assay (FDA approved for NASAL specimens only), is one component of a comprehensive MRSA colonization surveillance program. It is not intended to diagnose MRSA infection nor to guide or monitor treatment for MRSA infections. Performed at [facility], [address] [place],  [address]    Creatinine: Recent Labs    08/01/19 1721 08/02/19 0234 08/02/19 1448 08/03/19 0643 08/04/19 0457  CREATININE 1.45* 1.52* 1.52* 1.91* 1.71*   Baseline Creatinine: 1.2    CC:  Chief Complaint  Patient presents with  . Shortness of Breath  . Leg Swelling  urinary retention   HPI: Chris Guerrero is a 79yo with BPH with urinary retention and difficult foley  placement  Blood pressure 128/85, pulse 68, temperature 98.3 F (36.8 C), temperature source Oral, resp. rate (!) 25, height _0  (1.753 m), weight 97.5 kg, SpO2 97 %. NED. A&Ox3.   No respiratory distress   Abd soft, NT, ND Normal phallus with bilateral descended testicles  Cystoscopy Procedure Note  Patient identification was confirmed, informed consent was obtained, and patient was prepped using Betadine solution.  Lidocaine jelly was administered per urethral meatus.  Pre-Procedure: - Inspection reveals a normal caliber ureteral meatus.  Procedure: The flexible cystoscope was introduced without difficulty - No urethral strictures/lesions are present. - Enlarged prostate bilobar hyperplasia. False passage at 6 o clock - Elevated bladder neck - Bilateral ureteral orifices identified - Bladder mucosa  reveals no ulcers, tumors, or lesions - No bladder stones - No trabeculation  Retroflexion shows 3cm intravesical prostatic protrusion. A sensor wire was advanced through the scope and the cystoscope was removed. A 16 french council catheter was placed over the wire and then the wire was removed. 700cc of dark urine drained.    Post-Procedure: - Patient tolerated the procedure well   Nicolette Bang, MD  Impression/Assessment:  [phone] with BPH and urinary retention  Plan:  1. 16 french council foley catheter placed over a wire. The foley should remain in place for 4-5 days prior to voiding trial.   Nicolette Bang 08/04/2019, 7:03 AM

## 2019-08-04 NOTE — Significant Event (Signed)
Notified by RN that patient having increased difficulty voiding and bladder scan with > 300 ccs.  I/O straight cath attempted, but unsuccessful.  Placed order for coude catheter placement.  RN attempted coude x 2 with no return of urine. Small amount of clot noted on catheter tip and patient continued to be unable to void.  Urology on call paged.  Rosezella Kronick K. Marletta Lor, MD

## 2019-08-04 NOTE — Progress Notes (Addendum)
Notified by assigned nurse for Coude catheter insertion upon arriving on department with 2 nurtt, , We introduced ourselves to the staff the patient. Order acknowledge for insertion and explain to patient the procedure with understanding acknowledge Using sterile technique, lidocaine urojet during the procedure 2 attempts were unsuccessful, secondary to noted clots on tip of catheter after removal. Assigned RN was notified and she notified the on call MD. Patient was made aware of this also. ADL's was provided, call bell placed within reach prior to leaving patient's room.

## 2019-08-04 NOTE — Patient Outreach (Signed)
[facility]) Care Management  08/04/2019  Chris Guerrero Jun 28, 1940 [id number]   Medication Adherence call to Chris Guerrero HIPPA Compliant Voice message left with a call back number. Chris Guerrero is showing past due on Losartan 50 mg under [place].   [place] Management Direct Dial [phone]  Fax [phone] [email or web address]

## 2019-08-04 NOTE — Progress Notes (Signed)
Patient has been attempting to void and has been unable to and is complaining of urinary discomfort.  Bladder scan at 322mL.  RN attempted to I and O cath patient, unable to advance to bladder due to meeting resistance.  I and O catheter removed, scant amount of blood noted on catheter tip when RN removed catheter.  Per patient has been taking medication due to prostate issue(s) prior to admission.  RN spoke with Dr. Marletta Lor, on call with Cardiology, order received to place Coude catheter.

## 2019-08-04 NOTE — Progress Notes (Signed)
Two attempts made to place coude catheter were unsuccessful. Bladder scan at this time 452mL.  RN updated Dr. Marletta Lor with this information.  Per Dr. Marletta Lor will contact on call Urologist.

## 2019-08-04 NOTE — Interval H&P Note (Signed)
History and Physical Interval Note:  08/04/2019 7:52 AM  Chris Guerrero  has presented today for surgery, with the diagnosis of cardiomyopathy.  The various methods of treatment have been discussed with the patient and family. After consideration of risks, benefits and other options for treatment, the patient has consented to  Procedure(s): RIGHT/LEFT HEART CATH AND CORONARY ANGIOGRAPHY (N/A) as a surgical intervention.  The patient's history has been reviewed, patient examined, no change in status, stable for surgery.  I have reviewed the patient's chart and labs.  Questions were answered to the patient's satisfaction.     Niketa Turner [employer]

## 2019-08-04 NOTE — Significant Event (Signed)
Discussed with Urology on call attending.  Urology cart ordered to bedside.   Jazmine Longshore K. Marletta Lor, MD

## 2019-08-05 LAB — CBC
HCT: 42.3 % (ref 39.0–52.0)
Hemoglobin: 13.7 g/dL (ref 13.0–17.0)
MCH: 29.4 pg (ref 26.0–34.0)
MCHC: 32.4 g/dL (ref 30.0–36.0)
MCV: 90.8 fL (ref 80.0–100.0)
Platelets: 146 10*3/uL — ABNORMAL LOW (ref 150–400)
RBC: 4.66 MIL/uL (ref 4.22–5.81)
RDW: 14.6 % (ref 11.5–15.5)
WBC: 7.5 10*3/uL (ref 4.0–10.5)
nRBC: 0 % (ref 0.0–0.2)

## 2019-08-05 LAB — COOXEMETRY PANEL
Carboxyhemoglobin: 1.2 % (ref 0.5–1.5)
Methemoglobin: 0.9 % (ref 0.0–1.5)
O2 Saturation: 67.6 %
Total hemoglobin: 13.3 g/dL (ref 12.0–16.0)

## 2019-08-05 LAB — BASIC METABOLIC PANEL
Anion gap: 13 (ref 5–15)
BUN: 18 mg/dL (ref 8–23)
CO2: 25 mmol/L (ref 22–32)
Calcium: 8.5 mg/dL — ABNORMAL LOW (ref 8.9–10.3)
Chloride: 97 mmol/L — ABNORMAL LOW (ref 98–111)
Creatinine, Ser: 1.51 mg/dL — ABNORMAL HIGH (ref 0.61–1.24)
GFR calc Af Amer: 50 mL/min — ABNORMAL LOW (ref >60)
GFR calc non Af Amer: 43 mL/min — ABNORMAL LOW (ref >60)
Glucose, Bld: 122 mg/dL — ABNORMAL HIGH (ref 70–99)
Potassium: 3.9 mmol/L (ref 3.5–5.1)
Sodium: 135 mmol/L (ref 135–145)

## 2019-08-05 LAB — MAGNESIUM: Magnesium: 2.2 mg/dL (ref 1.7–2.4)

## 2019-08-05 MED ORDER — POTASSIUM CHLORIDE CRYS ER 20 MEQ PO TBCR
40.0000 meq | EXTENDED_RELEASE_TABLET | Freq: Once | ORAL | Status: AC
  Administered 2019-08-05: 12:00:00 40 meq via ORAL
  Filled 2019-08-05: qty 2

## 2019-08-05 MED ORDER — TRAZODONE HCL 50 MG PO TABS
50.0000 mg | ORAL_TABLET | Freq: Every day | ORAL | Status: DC
  Administered 2019-08-05 – 2019-08-08 (×4): 50 mg via ORAL
  Filled 2019-08-05 (×4): qty 1

## 2019-08-05 MED ORDER — LOSARTAN POTASSIUM 25 MG PO TABS
12.5000 mg | ORAL_TABLET | Freq: Two times a day (BID) | ORAL | Status: DC
  Administered 2019-08-05 – 2019-08-06 (×3): 12.5 mg via ORAL
  Filled 2019-08-05 (×3): qty 1

## 2019-08-05 MED ORDER — FUROSEMIDE 10 MG/ML IJ SOLN
80.0000 mg | Freq: Two times a day (BID) | INTRAMUSCULAR | Status: DC
  Administered 2019-08-05 – 2019-08-07 (×6): 80 mg via INTRAVENOUS
  Filled 2019-08-05 (×6): qty 8

## 2019-08-05 NOTE — Plan of Care (Signed)
RN encouraged patient to increase activity.  Patient agreeable to getting out of bed and sitting in chair this evening.  Patient currently sitting in chair, call light and personal belongings within reach.

## 2019-08-05 NOTE — Progress Notes (Signed)
Patient requesting something to help with sleep.  Per patient takes Trazodone at home (noted on PTA med list).  RN text paged Cardiology.

## 2019-08-05 NOTE — Progress Notes (Signed)
Patient ID: Chris Guerrero, male   DOB: July 03, 1940, 79 y.o.   MRN: [id number]     Advanced Heart Failure Rounding Note  PCP-Cardiologist: Kate Sable, MD   Subjective:    Patient remains on milrinone 0.25, co-ox 68%.  CVP 18-19 this morning.  Creatinine down to 1.5.    Urinary retention, difficult foley insertion by urology, has hematuria which has cleared some.    RHC/LHC:  Coronary Findings  Diagnostic Dominance: Right Left Main  No significant coronary disease.  Left Anterior Descending  No significant coronary disease.  Left Circumflex  No significant coronary disease.  Right Coronary Artery  No significant coronary disease.  Intervention  No interventions have been documented. Right Heart  Right Heart Pressures RHC Procedural Findings (milrinone 0.375): Hemodynamics (mmHg) RA mean 8 RV 38/8 PA 41/14, mean 29 PCWP mean 19 LV 76/14 AO 81/42  Oxygen saturations: PA 75% AO 98%  Cardiac Output (Fick) 6.44  Cardiac Index (Fick) 3.02 PVR 1.6 WU  30 cc contrast      Objective:   Weight Range: 97 kg Body mass index is 31.59 kg/m.   Vital Signs:   Temp:  [97.5 F (36.4 C)-98.5 F (36.9 C)] 98.5 F (36.9 C) (02/20 0819) Pulse Rate:  [89-110] 110 (02/20 1008) Resp:  [18-22] 22 (02/20 0819) BP: (115-133)/(87-102) 128/87 (02/20 0819) SpO2:  [96 %-97 %] 97 % (02/20 0819) Weight:  [97 kg] 97 kg (02/20 0532) Last BM Date: 08/04/19(Per chart)  Weight change: Filed Weights   08/03/19 0157 08/04/19 0330 08/05/19 0532  Weight: 98.1 kg 97.5 kg 97 kg    Intake/Output:   Intake/Output Summary (Last 24 hours) at 08/05/2019 1025 Last data filed at 08/05/2019 0500 Gross per 24 hour  Intake 260 ml  Output 1520 ml  Net -1260 ml      Physical Exam    General: NAD Neck: JVP difficult but appears elevated, no thyromegaly or thyroid nodule.  Lungs: Clear to auscultation bilaterally with normal respiratory effort. CV: Nondisplaced PMI.  Heart irregular  S1/S2, no S3/S4, no murmur.  1+ edema to knees.  Abdomen: Soft, nontender, no hepatosplenomegaly, no distention.  Skin: Intact without lesions or rashes.  Neurologic: Alert and oriented x 3.  Psych: Normal affect. Extremities: No clubbing or cyanosis.  HEENT: Normal.    Telemetry   Atrial fibrillation 90s-100s  Labs    CBC Recent Labs    08/04/19 0457 08/04/19 0815  WBC 7.6  --   HGB 13.6 13.6  12.9*  HCT 41.8 40.0  38.0*  MCV 90.5  --   PLT 160  --    Basic Metabolic Panel Recent Labs    08/04/19 0457 08/04/19 0457 08/04/19 0815 08/05/19 0559  NA 137   < > 137  136 135  K 3.4*   < > 3.3*  3.1* 3.9  CL 99  --   --  97*  CO2 22  --   --  25  GLUCOSE 150*  --   --  122*  BUN 24*  --   --  18  CREATININE 1.71*  --   --  1.51*  CALCIUM 8.8*  --   --  8.5*  MG 1.8  --   --  2.2   < > = values in this interval not displayed.   Liver Function Tests Recent Labs    08/02/19 1448  AST 30  ALT 23  ALKPHOS 99  BILITOT 1.4*  PROT 6.3*  ALBUMIN 3.5  No results for input(s): LIPASE, AMYLASE in the last 72 hours. Cardiac Enzymes No results for input(s): CKTOTAL, CKMB, CKMBINDEX, TROPONINI in the last 72 hours.  BNP: BNP (last 3 results) Recent Labs    08/01/19 1721  BNP 1,068.0*    ProBNP (last 3 results) No results for input(s): PROBNP in the last 8760 hours.   D-Dimer No results for input(s): DDIMER in the last 72 hours. Hemoglobin A1C No results for input(s): HGBA1C in the last 72 hours. Fasting Lipid Panel No results for input(s): CHOL, HDL, LDLCALC, TRIG, CHOLHDL, LDLDIRECT in the last 72 hours. Thyroid Function Tests No results for input(s): TSH, T4TOTAL, T3FREE, THYROIDAB in the last 72 hours.  Invalid input(s): FREET3  Other results:   Imaging    No results found.   Medications:     Scheduled Medications: . apixaban  2.5 mg Oral BID  . atorvastatin  10 mg Oral QPM  . Chlorhexidine Gluconate Cloth  6 each Topical Daily  .  digoxin  0.125 mg Oral Daily  . furosemide  80 mg Intravenous BID  . losartan  12.5 mg Oral BID  . potassium chloride  40 mEq Oral Once  . sodium chloride flush  10-40 mL Intracatheter Q12H  . sodium chloride flush  3 mL Intravenous Q12H  . spironolactone  12.5 mg Oral Daily    Infusions: . sodium chloride    . sodium chloride 10 mL/hr at 08/03/19 0437  . amiodarone 30 mg/hr (08/05/19 0408)  . milrinone 0.25 mcg/kg/min (08/04/19 2331)    PRN Medications: sodium chloride, acetaminophen, acetaminophen, ondansetron (ZOFRAN) IV, sodium chloride flush, sodium chloride flush    Assessment/Plan   1. Acute systolic CHF: Echo this admission with EF <20%, severely decreased RV function. Nonischemic cardiomyopathy, ?tachycardia-mediated.  No prior echo or history of CHF.  He has been symptomatic for about 1 month with edema and exertional dyspnea.  He was in atrial fibrillation with RVR at presentation.  Milrinone started with low co-ox.  RHC/LHC 2/19 with no coronary disease, mildly elevated filling pressures.  This morning, co-ox 68% and CVP higher at 18-19 (1 dose of Lasix yesterday).  Creatinine lower at 1.5.  Weight is trending down.  - Lasix 80 mg IV bid today and replace K.  - Continue milrinone 0.25 while diuresing, aim to wean prior to DCCV.   - Continue digoxin 0.125 daily.  - Continue spironolactone 12.5 daily.   - Add losartan 12.5 mg bid.  If BP and creatinine remain stable, transition to [place].  - TEE-guided DCCV on Monday.  - Consider cardiac MRI when back in NSR.  2. Atrial fibrillation: Admitted with afib/RVR of uncertain duration.  Unsure if atrial fibrillation/RVR was cause of CMP (tachy-mediated) or an effect of the cardiomyopathy.  Regardless, he will need restoration of NSR.  - Continue amiodarone gtt for rate control.  - Continue apixaban.   - Will plan TEE-guided DCCV Monday.  3. AKI on ?CKD stage 3: Initial creatinine 1.5, up to 1.9 initially and now 1.5 again.   Suspect cardiorenal syndrome.  4. Hematuria: Difficult foley insertion with BPH.  Seems to be clearing.  - Needs CBC today.   Length of Stay: [place], MD  08/05/2019, 10:25 AM  Advanced Heart Failure Team Pager [phone] (M-F; 7a - 4p)  Please contact [facility] for night-coverage after hours (4p -7a ) and weekends on [email or web address]

## 2019-08-05 NOTE — Progress Notes (Signed)
[id number] Pt is SOB lying in bed on oxygen and HR 108-114 afib. Gave pt OFF the BEAT booklet and CHF booklet. Discussed importance of daily weights, low sodium diet and signs/symptoms of CHF. Pt will need reinforcement of ed. Encouraged him to read materials. Will continue to follow. Graylon Good RN BSN 08/05/2019 10:09 AM

## 2019-08-06 LAB — CBC WITH DIFFERENTIAL/PLATELET
Abs Immature Granulocytes: 0.02 10*3/uL (ref 0.00–0.07)
Basophils Absolute: 0 10*3/uL (ref 0.0–0.1)
Basophils Relative: 0 %
Eosinophils Absolute: 0.3 10*3/uL (ref 0.0–0.5)
Eosinophils Relative: 5 %
HCT: 39.1 % (ref 39.0–52.0)
Hemoglobin: 12.9 g/dL — ABNORMAL LOW (ref 13.0–17.0)
Immature Granulocytes: 0 %
Lymphocytes Relative: 26 %
Lymphs Abs: 1.7 10*3/uL (ref 0.7–4.0)
MCH: 29.9 pg (ref 26.0–34.0)
MCHC: 33 g/dL (ref 30.0–36.0)
MCV: 90.7 fL (ref 80.0–100.0)
Monocytes Absolute: 0.9 10*3/uL (ref 0.1–1.0)
Monocytes Relative: 13 %
Neutro Abs: 3.5 10*3/uL (ref 1.7–7.7)
Neutrophils Relative %: 56 %
Platelets: 129 10*3/uL — ABNORMAL LOW (ref 150–400)
RBC: 4.31 MIL/uL (ref 4.22–5.81)
RDW: 14.5 % (ref 11.5–15.5)
WBC: 6.4 10*3/uL (ref 4.0–10.5)
nRBC: 0 % (ref 0.0–0.2)

## 2019-08-06 LAB — BASIC METABOLIC PANEL
Anion gap: 8 (ref 5–15)
BUN: 15 mg/dL (ref 8–23)
CO2: 28 mmol/L (ref 22–32)
Calcium: 8.3 mg/dL — ABNORMAL LOW (ref 8.9–10.3)
Chloride: 100 mmol/L (ref 98–111)
Creatinine, Ser: 1.33 mg/dL — ABNORMAL HIGH (ref 0.61–1.24)
GFR calc Af Amer: 58 mL/min — ABNORMAL LOW (ref >60)
GFR calc non Af Amer: 50 mL/min — ABNORMAL LOW (ref >60)
Glucose, Bld: 95 mg/dL (ref 70–99)
Potassium: 3.6 mmol/L (ref 3.5–5.1)
Sodium: 136 mmol/L (ref 135–145)

## 2019-08-06 LAB — COOXEMETRY PANEL
Carboxyhemoglobin: 1.2 % (ref 0.5–1.5)
Methemoglobin: 1 % (ref 0.0–1.5)
O2 Saturation: 64 %
Total hemoglobin: 13.2 g/dL (ref 12.0–16.0)

## 2019-08-06 LAB — MAGNESIUM: Magnesium: 1.6 mg/dL — ABNORMAL LOW (ref 1.7–2.4)

## 2019-08-06 MED ORDER — MAGNESIUM SULFATE 50 % IJ SOLN
3.0000 g | Freq: Once | INTRAVENOUS | Status: AC
  Administered 2019-08-06: 12:00:00 3 g via INTRAVENOUS
  Filled 2019-08-06: qty 6

## 2019-08-06 MED ORDER — METOLAZONE 5 MG PO TABS
2.5000 mg | ORAL_TABLET | Freq: Once | ORAL | Status: AC
  Administered 2019-08-06: 2.5 mg via ORAL
  Filled 2019-08-06: qty 1

## 2019-08-06 MED ORDER — POTASSIUM CHLORIDE CRYS ER 20 MEQ PO TBCR
40.0000 meq | EXTENDED_RELEASE_TABLET | Freq: Once | ORAL | Status: AC
  Administered 2019-08-06: 17:00:00 40 meq via ORAL
  Filled 2019-08-06: qty 2

## 2019-08-06 MED ORDER — SODIUM CHLORIDE 0.9 % IV SOLN: INTRAVENOUS | Status: DC

## 2019-08-06 MED ORDER — POTASSIUM CHLORIDE CRYS ER 20 MEQ PO TBCR
40.0000 meq | EXTENDED_RELEASE_TABLET | Freq: Once | ORAL | Status: AC
  Administered 2019-08-06: 12:00:00 40 meq via ORAL
  Filled 2019-08-06: qty 2

## 2019-08-06 MED ORDER — LOSARTAN POTASSIUM 25 MG PO TABS
25.0000 mg | ORAL_TABLET | Freq: Two times a day (BID) | ORAL | Status: DC
  Administered 2019-08-06: 25 mg via ORAL
  Filled 2019-08-06: qty 1

## 2019-08-06 NOTE — Progress Notes (Signed)
Patient ID: Chris Guerrero, male   DOB: Jun 06, 1941, 79 y.o.   MRN: [id number]     Advanced Heart Failure Rounding Note  PCP-Cardiologist: Kate Sable, MD   Subjective:    Patient remains on milrinone 0.25, co-ox 64%.  CVP 15-16 this morning.  Creatinine down to 1.33.  Good diuresis yesterday, weight down 4 lbs.   Urinary retention, difficult foley insertion by urology, has hematuria which has cleared some.    RHC/LHC:  Coronary Findings  Diagnostic Dominance: Right Left Main  No significant coronary disease.  Left Anterior Descending  No significant coronary disease.  Left Circumflex  No significant coronary disease.  Right Coronary Artery  No significant coronary disease.  Intervention  No interventions have been documented. Right Heart  Right Heart Pressures RHC Procedural Findings (milrinone 0.375): Hemodynamics (mmHg) RA mean 8 RV 38/8 PA 41/14, mean 29 PCWP mean 19 LV 76/14 AO 81/42  Oxygen saturations: PA 75% AO 98%  Cardiac Output (Fick) 6.44  Cardiac Index (Fick) 3.02 PVR 1.6 WU  30 cc contrast      Objective:   Weight Range: 94.8 kg Body mass index is 30.86 kg/m.   Vital Signs:   Temp:  [97.5 F (36.4 C)-97.7 F (36.5 C)] 97.6 F (36.4 C) (02/21 0424) Pulse Rate:  [92-106] 106 (02/21 0926) Resp:  [18-24] 19 (02/21 0826) BP: (100-110)/(74-89) 107/74 (02/21 0424) SpO2:  [95 %] 95 % (02/21 0424) Weight:  [94.8 kg] 94.8 kg (02/21 0424) Last BM Date: 08/04/19  Weight change: Filed Weights   08/04/19 0330 08/05/19 0532 08/06/19 0424  Weight: 97.5 kg 97 kg 94.8 kg    Intake/Output:   Intake/Output Summary (Last 24 hours) at 08/06/2019 1036 Last data filed at 08/06/2019 0430 Gross per 24 hour  Intake 240 ml  Output 3525 ml  Net -3285 ml      Physical Exam    General: NAD Neck: Difficult but appears elevated, no thyromegaly or thyroid nodule.  Lungs: Clear to auscultation bilaterally with normal respiratory effort. CV:  Nondisplaced PMI.  Heart irregular S1/S2, no S3/S4, no murmur.  1+ edema at ankles. Abdomen: Soft, nontender, no hepatosplenomegaly, no distention.  Skin: Intact without lesions or rashes.  Neurologic: Alert and oriented x 3.  Psych: Normal affect. Extremities: No clubbing or cyanosis.  HEENT: Normal.    Telemetry   Atrial fibrillation 90s-100s  Labs    CBC Recent Labs    08/05/19 1051 08/06/19 0511  WBC 7.5 6.4  NEUTROABS  --  3.5  HGB 13.7 12.9*  HCT 42.3 39.1  MCV 90.8 90.7  PLT 146* 989*   Basic Metabolic Panel Recent Labs    08/05/19 0559 08/06/19 0511  NA 135 136  K 3.9 3.6  CL 97* 100  CO2 25 28  GLUCOSE 122* 95  BUN 18 15  CREATININE 1.51* 1.33*  CALCIUM 8.5* 8.3*  MG 2.2 1.6*   Liver Function Tests No results for input(s): AST, ALT, ALKPHOS, BILITOT, PROT, ALBUMIN in the last 72 hours. No results for input(s): LIPASE, AMYLASE in the last 72 hours. Cardiac Enzymes No results for input(s): CKTOTAL, CKMB, CKMBINDEX, TROPONINI in the last 72 hours.  BNP: BNP (last 3 results) Recent Labs    08/01/19 1721  BNP 1,068.0*    ProBNP (last 3 results) No results for input(s): PROBNP in the last 8760 hours.   D-Dimer No results for input(s): DDIMER in the last 72 hours. Hemoglobin A1C No results for input(s): HGBA1C in the last 72  hours. Fasting Lipid Panel No results for input(s): CHOL, HDL, LDLCALC, TRIG, CHOLHDL, LDLDIRECT in the last 72 hours. Thyroid Function Tests No results for input(s): TSH, T4TOTAL, T3FREE, THYROIDAB in the last 72 hours.  Invalid input(s): FREET3  Other results:   Imaging    No results found.   Medications:     Scheduled Medications: . apixaban  2.5 mg Oral BID  . atorvastatin  10 mg Oral QPM  . Chlorhexidine Gluconate Cloth  6 each Topical Daily  . digoxin  0.125 mg Oral Daily  . furosemide  80 mg Intravenous BID  . losartan  25 mg Oral BID  . metolazone  2.5 mg Oral Once  . potassium chloride  40 mEq  Oral Once  . potassium chloride  40 mEq Oral Once  . sodium chloride flush  10-40 mL Intracatheter Q12H  . sodium chloride flush  3 mL Intravenous Q12H  . spironolactone  12.5 mg Oral Daily  . traZODone  50 mg Oral QHS    Infusions: . sodium chloride    . sodium chloride 10 mL/hr at 08/03/19 0437  . amiodarone 30 mg/hr (08/06/19 0726)  . magnesium sulfate bolus IVPB    . milrinone 0.25 mcg/kg/min (08/06/19 0728)    PRN Medications: sodium chloride, acetaminophen, acetaminophen, ondansetron (ZOFRAN) IV, sodium chloride flush, sodium chloride flush    Assessment/Plan   1. Acute systolic CHF: Echo this admission with EF <20%, severely decreased RV function. Nonischemic cardiomyopathy, ?tachycardia-mediated.  No prior echo or history of CHF.  He has been symptomatic for about 1 month with edema and exertional dyspnea.  He was in atrial fibrillation with RVR at presentation.  Milrinone started with low co-ox.  RHC/LHC 2/19 with no coronary disease, mildly elevated filling pressures.  This morning, co-ox 64% and CVP lower at 15-16.  Creatinine lower at 1.33.  Weight is trending down.  - Lasix 80 mg IV bid today and replace K.  - Will give a dose of metolazone 2.5 mg x 1 today.  - Continue milrinone 0.25 while diuresing, aim to wean prior to DCCV => hopefully drop dose tomorrow morning.   - Continue digoxin 0.125 daily.  - Continue spironolactone 12.5 daily.   - Increase losartan to 25 mg bid.  If BP and creatinine remain stable, transition to [place].  - TEE-guided DCCV on Monday.  - Consider cardiac MRI when back in NSR.  2. Atrial fibrillation: Admitted with afib/RVR of uncertain duration.  Unsure if atrial fibrillation/RVR was cause of CMP (tachy-mediated) or an effect of the cardiomyopathy.  Regardless, he will need restoration of NSR.  - Continue amiodarone gtt for rate control.  - Continue apixaban.   - Will plan TEE-guided DCCV Monday.  3. AKI on ?CKD stage 3: Initial creatinine  1.5, up to 1.9 initially and now 1.33.  Suspect cardiorenal syndrome.  4. Hematuria: Difficult foley insertion with BPH.  Seems to be clearing. CBC stable.   Length of Stay: [place], MD  08/06/2019, 10:36 AM  Advanced Heart Failure Team Pager [phone] (M-F; 7a - 4p)  Please contact [facility] for night-coverage after hours (4p -7a ) and weekends on [email or web address]

## 2019-08-06 NOTE — Evaluation (Signed)
Physical Therapy Evaluation Patient Details Name: Chris Guerrero MRN: [id number] DOB: 05/28/1941 Today's Date: 08/06/2019   History of Present Illness  Patient is an 79 year old male who was admitted with SOB, LE swelling x 3-4 weeks, found to be in Afib with RVR. PMH includes:HTN, HLD,  s/p cardiac cath and angiography 2/19.  Clinical Impression  Patient received in bed, pleasant, agreeable to PT assessment. Patient received on 2 lpm O2 via nasal canula. Reports he is not on home O2, sats at 98%. Removed supplemental O2 and sats at 95% prior to ambulation. Patient requires supervision for bed mobility and transfers. Ambulated 150 with RW and min guard. Good pace, no difficulties noted. O2 sats after 75 feet at 95%, after returning to room sats at 93%. Patient kept off supplemental O2 after walking. RN notified. Patient will continue to benefit from skilled PT to improve strength and functional independence for safe return home alone.       Follow Up Recommendations Home health PT    Equipment Recommendations  Other (comment)(TBD)    Recommendations for Other Services       Precautions / Restrictions Precautions Precautions: Fall Restrictions Weight Bearing Restrictions: No      Mobility  Bed Mobility Overal bed mobility: Independent                Transfers Overall transfer level: Needs assistance   Transfers: Sit to/from Stand Sit to Stand: Supervision            Ambulation/Gait Ambulation/Gait assistance: Min guard Gait Distance (Feet): 150 Feet Assistive device: Rolling walker (2 wheeled) Gait Pattern/deviations: Step-through pattern Gait velocity: WNL   General Gait Details: steady, no difficulties reported, sats remained > 90% throughout session on room air.  Stairs            Wheelchair Mobility    Modified Rankin (Stroke Patients Only)       Balance Overall balance assessment: Mild deficits observed, not formally tested                                            Pertinent Vitals/Pain Pain Assessment: No/denies pain    Home Living Family/patient expects to be discharged to:: Private residence Living Arrangements: Alone   Type of Home: House         Home Equipment: None      Prior Function Level of Independence: Independent         Comments: drives, was very independent     Hand Dominance        Extremity/Trunk Assessment        Lower Extremity Assessment Lower Extremity Assessment: Generalized weakness    Cervical / Trunk Assessment Cervical / Trunk Assessment: Normal  Communication   Communication: No difficulties  Cognition Arousal/Alertness: Awake/alert Behavior During Therapy: WFL for tasks assessed/performed Overall Cognitive Status: Within Functional Limits for tasks assessed                                        General Comments      Exercises     Assessment/Plan    PT Assessment Patient needs continued PT services  PT Problem List Decreased strength;Decreased activity tolerance;Decreased mobility       PT Treatment Interventions Gait training;Stair training;Functional mobility training;Therapeutic  activities;Therapeutic exercise;Patient/family education    PT Goals (Current goals can be found in the Care Plan section)  Acute Rehab PT Goals Patient Stated Goal: to return home PT Goal Formulation: With patient Time For Goal Achievement: 08/20/19 Potential to Achieve Goals: Good    Frequency Min 3X/week   Barriers to discharge Decreased caregiver support      Co-evaluation               AM-PAC PT "6 Clicks" Mobility  Outcome Measure Help needed turning from your back to your side while in a flat bed without using bedrails?: None Help needed moving from lying on your back to sitting on the side of a flat bed without using bedrails?: None Help needed moving to and from a bed to a chair (including a wheelchair)?: A  Little Help needed standing up from a chair using your arms (e.g., wheelchair or bedside chair)?: A Little Help needed to walk in hospital room?: A Little Help needed climbing 3-5 steps with a railing? : A Little 6 Click Score: 20    End of Session Equipment Utilized During Treatment: Gait belt Activity Tolerance: Patient tolerated treatment well Patient left: in bed;with call bell/phone within reach;with bed alarm set Nurse Communication: Mobility status PT Visit Diagnosis: Muscle weakness (generalized) (M62.81)    Time: 5830-9407 PT Time Calculation (min) (ACUTE ONLY): 20 min   Charges:   PT Evaluation $PT Eval Moderate Complexity: 1 Mod PT Treatments $Gait Training: 8-22 mins        Albertina Leise, PT, GCS 08/06/19,1:49 PM

## 2019-08-07 ENCOUNTER — Encounter (HOSPITAL_COMMUNITY)
Admission: EM | Disposition: A | Discharge status: Home Health | Payer: Self-pay | Source: Home / Self Care | Attending: Cardiovascular Disease

## 2019-08-07 ENCOUNTER — Encounter (HOSPITAL_COMMUNITY): Payer: Self-pay | Admitting: Cardiovascular Disease

## 2019-08-07 ENCOUNTER — Inpatient Hospital Stay (HOSPITAL_COMMUNITY): Payer: No Typology Code available for payment source

## 2019-08-07 ENCOUNTER — Inpatient Hospital Stay (HOSPITAL_COMMUNITY): Payer: No Typology Code available for payment source | Admitting: Certified Registered Nurse Anesthetist

## 2019-08-07 DIAGNOSIS — Q211 Atrial septal defect: Secondary | ICD-10-CM

## 2019-08-07 DIAGNOSIS — I4891 Unspecified atrial fibrillation: Secondary | ICD-10-CM

## 2019-08-07 HISTORY — PX: TEE WITHOUT CARDIOVERSION: SHX5443

## 2019-08-07 LAB — COOXEMETRY PANEL
Carboxyhemoglobin: 1.3 % (ref 0.5–1.5)
Carboxyhemoglobin: 1.1 % (ref 0.5–1.5)
Methemoglobin: 0.8 % (ref 0.0–1.5)
Methemoglobin: 0.9 % (ref 0.0–1.5)
O2 Saturation: 61.5 %
O2 Saturation: 49.4 %
Total hemoglobin: 13.9 g/dL (ref 12.0–16.0)
Total hemoglobin: 14.8 g/dL (ref 12.0–16.0)

## 2019-08-07 LAB — CBC WITH DIFFERENTIAL/PLATELET
Abs Immature Granulocytes: 0.02 10*3/uL (ref 0.00–0.07)
Basophils Absolute: 0 10*3/uL (ref 0.0–0.1)
Basophils Relative: 1 %
Eosinophils Absolute: 0.3 10*3/uL (ref 0.0–0.5)
Eosinophils Relative: 4 %
HCT: 41.1 % (ref 39.0–52.0)
Hemoglobin: 13.4 g/dL (ref 13.0–17.0)
Immature Granulocytes: 0 %
Lymphocytes Relative: 20 %
Lymphs Abs: 1.5 10*3/uL (ref 0.7–4.0)
MCH: 29.6 pg (ref 26.0–34.0)
MCHC: 32.6 g/dL (ref 30.0–36.0)
MCV: 90.7 fL (ref 80.0–100.0)
Monocytes Absolute: 1 10*3/uL (ref 0.1–1.0)
Monocytes Relative: 14 %
Neutro Abs: 4.4 10*3/uL (ref 1.7–7.7)
Neutrophils Relative %: 61 %
Platelets: 165 10*3/uL (ref 150–400)
RBC: 4.53 MIL/uL (ref 4.22–5.81)
RDW: 14.6 % (ref 11.5–15.5)
WBC: 7.2 10*3/uL (ref 4.0–10.5)
nRBC: 0 % (ref 0.0–0.2)

## 2019-08-07 LAB — BASIC METABOLIC PANEL
Anion gap: 13 (ref 5–15)
BUN: 15 mg/dL (ref 8–23)
CO2: 30 mmol/L (ref 22–32)
Calcium: 8.9 mg/dL (ref 8.9–10.3)
Chloride: 93 mmol/L — ABNORMAL LOW (ref 98–111)
Creatinine, Ser: 1.39 mg/dL — ABNORMAL HIGH (ref 0.61–1.24)
GFR calc Af Amer: 55 mL/min — ABNORMAL LOW (ref >60)
GFR calc non Af Amer: 48 mL/min — ABNORMAL LOW (ref >60)
Glucose, Bld: 122 mg/dL — ABNORMAL HIGH (ref 70–99)
Potassium: 3.9 mmol/L (ref 3.5–5.1)
Sodium: 136 mmol/L (ref 135–145)

## 2019-08-07 LAB — PROTIME-INR
INR: 1.3 — ABNORMAL HIGH (ref 0.8–1.2)
Prothrombin Time: 16.1 seconds — ABNORMAL HIGH (ref 11.4–15.2)

## 2019-08-07 LAB — MAGNESIUM: Magnesium: 2 mg/dL (ref 1.7–2.4)

## 2019-08-07 SURGERY — ECHOCARDIOGRAM, TRANSESOPHAGEAL
Anesthesia: Monitor Anesthesia Care

## 2019-08-07 MED ORDER — MILRINONE LACTATE IN DEXTROSE 20-5 MG/100ML-% IV SOLN
0.1250 ug/kg/min | INTRAVENOUS | Status: DC
  Administered 2019-08-07: 0.125 ug/kg/min via INTRAVENOUS
  Filled 2019-08-07: qty 100

## 2019-08-07 MED ORDER — LIDOCAINE 2% (20 MG/ML) 5 ML SYRINGE
INTRAMUSCULAR | Status: DC | PRN
  Administered 2019-08-07: 100 mg via INTRAVENOUS

## 2019-08-07 MED ORDER — PHENYLEPHRINE 40 MCG/ML (10ML) SYRINGE FOR IV PUSH (FOR BLOOD PRESSURE SUPPORT)
PREFILLED_SYRINGE | INTRAVENOUS | Status: DC | PRN
  Administered 2019-08-07: 120 ug via INTRAVENOUS
  Administered 2019-08-07: 160 ug via INTRAVENOUS
  Administered 2019-08-07: 120 ug via INTRAVENOUS

## 2019-08-07 MED ORDER — APIXABAN 5 MG PO TABS
5.0000 mg | ORAL_TABLET | Freq: Two times a day (BID) | ORAL | Status: DC
  Administered 2019-08-07 – 2019-08-09 (×4): 5 mg via ORAL
  Filled 2019-08-07 (×4): qty 1

## 2019-08-07 MED ORDER — PROPOFOL 500 MG/50ML IV EMUL
INTRAVENOUS | Status: DC | PRN
  Administered 2019-08-07: 100 ug/kg/min via INTRAVENOUS

## 2019-08-07 MED ORDER — SACUBITRIL-VALSARTAN 24-26 MG PO TABS
1.0000 | ORAL_TABLET | Freq: Two times a day (BID) | ORAL | Status: DC
  Administered 2019-08-07 – 2019-08-09 (×4): 1 via ORAL
  Filled 2019-08-07 (×5): qty 1

## 2019-08-07 MED FILL — Heparin Sodium (Porcine) Inj 1000 Unit/ML: INTRAMUSCULAR | Qty: 10 | Status: AC

## 2019-08-07 NOTE — Interval H&P Note (Signed)
History and Physical Interval Note:  08/07/2019 1:05 PM  Chris Guerrero  has presented today for surgery, with the diagnosis of afib.  The various methods of treatment have been discussed with the patient and family. After consideration of risks, benefits and other options for treatment, the patient has consented to  Procedure(s): TRANSESOPHAGEAL ECHOCARDIOGRAM (TEE) (N/A) as a surgical intervention.  The patient's history has been reviewed, patient examined, no change in status, stable for surgery.  I have reviewed the patient's chart and labs.  Questions were answered to the patient's satisfaction.     Savanna Dooley [employer]

## 2019-08-07 NOTE — Progress Notes (Addendum)
Patient ID: Chris Guerrero, male   DOB: 1941-03-11, 79 y.o.   MRN: [id number]     Advanced Heart Failure Rounding Note  PCP-Cardiologist: Kate Sable, MD   Subjective:    Patient remains on milrinone 0.25, co-ox 61%. CVP down 10-11. Weight down another 9 pounds.   Urine has cleared.   Denies SOB.     RHC/LHC:  Coronary Findings  Diagnostic Dominance: Right Left Main  No significant coronary disease.  Left Anterior Descending  No significant coronary disease.  Left Circumflex  No significant coronary disease.  Right Coronary Artery  No significant coronary disease.  Intervention  No interventions have been documented. Right Heart  Right Heart Pressures RHC Procedural Findings (milrinone 0.375): Hemodynamics (mmHg) RA mean 8 RV 38/8 PA 41/14, mean 29 PCWP mean 19 LV 76/14 AO 81/42  Oxygen saturations: PA 75% AO 98%  Cardiac Output (Fick) 6.44  Cardiac Index (Fick) 3.02 PVR 1.6 WU  30 cc contrast      Objective:   Weight Range: 90.9 kg Body mass index is 29.61 kg/m.   Vital Signs:   Temp:  [97.4 F (36.3 C)-98.3 F (36.8 C)] 97.8 F (36.6 C) (02/22 0332) Pulse Rate:  [101-106] 101 (02/22 0332) Resp:  [17-25] 17 (02/22 0057) BP: (92-124)/(72-100) 97/73 (02/22 0332) SpO2:  [90 %-98 %] 90 % (02/22 0332) Weight:  [90.9 kg] 90.9 kg (02/22 0332) Last BM Date: 08/06/19  Weight change: Filed Weights   08/05/19 0532 08/06/19 0424 08/07/19 0332  Weight: 97 kg 94.8 kg 90.9 kg    Intake/Output:   Intake/Output Summary (Last 24 hours) at 08/07/2019 0721 Last data filed at 08/07/2019 0429 Gross per 24 hour  Intake 1222.61 ml  Output 3950 ml  Net -2727.39 ml      Physical Exam  CVP 10-11 General: No resp difficulty HEENT: normal Neck: supple. JVP 10-11. Carotids 2+ bilat; no bruits. No lymphadenopathy or thryomegaly appreciated. Cor: PMI nondisplaced. Irregular rate & rhythm. No rubs, gallops or murmurs. Lungs: clear Abdomen: soft,  nontender, nondistended. No hepatosplenomegaly. No bruits or masses. Good bowel sounds. Extremities: no cyanosis, clubbing, rash, R and LLE 1+ edema. RUE PICC  Neuro: alert & orientedx3, cranial nerves grossly intact. moves all 4 extremities w/o difficulty. Affect pleasant GU: Foley yellow urine    Telemetry    A fib 80-90s   Labs    CBC Recent Labs    08/06/19 0511 08/07/19 0342  WBC 6.4 7.2  NEUTROABS 3.5 4.4  HGB 12.9* 13.4  HCT 39.1 41.1  MCV 90.7 90.7  PLT 129* 466   Basic Metabolic Panel Recent Labs    08/06/19 0511 08/07/19 0342  NA 136 136  K 3.6 3.9  CL 100 93*  CO2 28 30  GLUCOSE 95 122*  BUN 15 15  CREATININE 1.33* 1.39*  CALCIUM 8.3* 8.9  MG 1.6* 2.0   Liver Function Tests No results for input(s): AST, ALT, ALKPHOS, BILITOT, PROT, ALBUMIN in the last 72 hours. No results for input(s): LIPASE, AMYLASE in the last 72 hours. Cardiac Enzymes No results for input(s): CKTOTAL, CKMB, CKMBINDEX, TROPONINI in the last 72 hours.  BNP: BNP (last 3 results) Recent Labs    08/01/19 1721  BNP 1,068.0*    ProBNP (last 3 results) No results for input(s): PROBNP in the last 8760 hours.   D-Dimer No results for input(s): DDIMER in the last 72 hours. Hemoglobin A1C No results for input(s): HGBA1C in the last 72 hours. Fasting Lipid Panel No  results for input(s): CHOL, HDL, LDLCALC, TRIG, CHOLHDL, LDLDIRECT in the last 72 hours. Thyroid Function Tests No results for input(s): TSH, T4TOTAL, T3FREE, THYROIDAB in the last 72 hours.  Invalid input(s): FREET3  Other results:   Imaging    No results found.   Medications:     Scheduled Medications: . apixaban  2.5 mg Oral BID  . atorvastatin  10 mg Oral QPM  . Chlorhexidine Gluconate Cloth  6 each Topical Daily  . digoxin  0.125 mg Oral Daily  . furosemide  80 mg Intravenous BID  . losartan  25 mg Oral BID  . sodium chloride flush  10-40 mL Intracatheter Q12H  . sodium chloride flush  3 mL  Intravenous Q12H  . spironolactone  12.5 mg Oral Daily  . traZODone  50 mg Oral QHS    Infusions: . sodium chloride    . sodium chloride 10 mL/hr at 08/03/19 0437  . sodium chloride    . amiodarone 30 mg/hr (08/06/19 2109)  . milrinone 0.25 mcg/kg/min (08/06/19 2110)    PRN Medications: sodium chloride, acetaminophen, acetaminophen, ondansetron (ZOFRAN) IV, sodium chloride flush, sodium chloride flush    Assessment/Plan   1. Acute systolic CHF: Echo this admission with EF <20%, severely decreased RV function. Nonischemic cardiomyopathy, ?tachycardia-mediated.  No prior echo or history of CHF.  He has been symptomatic for about 1 month with edema and exertional dyspnea.  He was in atrial fibrillation with RVR at presentation.  Milrinone started with low co-ox.  RHC/LHC 2/19 with no coronary disease, mildly elevated filling pressures.  TCVP down to 10-11 - Lasix 80 mg IV bid today - Cut back milrinone 0.125 mcg.    - Continue digoxin 0.125 daily.  - Continue spironolactone 12.5 daily.   - Stop losartan. Start entresto 24-26 mg twice a day.   - TEE-guided DCCV today  - Consider cardiac MRI when back in NSR.  2. Atrial fibrillation: Admitted with afib/RVR of uncertain duration.  Unsure if atrial fibrillation/RVR was cause of CMP (tachy-mediated) or an effect of the cardiomyopathy.  Regardless, he will need restoration of NSR.  - Continue amiodarone gtt for rate control.  - Continue apixaban.   - TEE-guided DCCV today  3. AKI on ?CKD stage 3: Initial creatinine 1.5, up to 1.9 initially and now 1.39.  Suspect cardiorenal syndrome.  4. Hematuria: Difficult foley insertion with BPH on 2/19.   Urine has cleared. Plan to keep foley 4-5 days before voiding trial.  He has been followed by Dr Adah Salvage in the community.   Length of Stay: 6  Amy Clegg, NP  08/07/2019, 7:21 AM  Advanced Heart Failure Team Pager [phone] (M-F; 7a - 4p)  Please contact [facility] for night-coverage after  hours (4p -7a ) and weekends on [email or web address]  Patient seen with NP, agree with the above note.    He is doing well today, co-ox 61%.  CVP 10-11.  Very good diuresis yesterday.  Creatinine down to 1.39.   General: NAD Neck: JVP 9-10 cm, no thyromegaly or thyroid nodule.  Lungs: Clear to auscultation bilaterally with normal respiratory effort. CV: Nondisplaced PMI.  Heart irregular S1/S2, no S3/S4, no murmur.  1+ edema 3/4 to knees.    Abdomen: Soft, nontender, no hepatosplenomegaly, no distention.  Skin: Intact without lesions or rashes.  Neurologic: Alert and oriented x 3.  Psych: Normal affect. Extremities: No clubbing or cyanosis.  HEENT: Normal.   Decrease milrinone to 0.125 and will give Lasix 80 mg IV  bid again today. Will aim to turn milrinone off prior to DCCV.  Agree with transition to [facility].   He remains in atrial fibrillation, plan TEE-guided DCCV today. Discussed risks/benefits with patient and he agrees.   Loralie Champagne 08/07/2019 8:15 AM

## 2019-08-07 NOTE — Transfer of Care (Signed)
Immediate Anesthesia Transfer of Care Note  Patient: Chris Guerrero  Procedure(s) Performed: TRANSESOPHAGEAL ECHOCARDIOGRAM (TEE) (N/A )  Patient Location: Endoscopy Unit  Anesthesia Type:MAC  Level of Consciousness: drowsy  Airway & Oxygen Therapy: Patient Spontanous Breathing and Patient connected to nasal cannula oxygen  Post-op Assessment: Report given to RN and Post -op Vital signs reviewed and stable  Post vital signs: Reviewed and stable  Last Vitals:  Vitals Value Taken Time  BP 92/63 08/07/19 1312  Temp    Pulse 48 08/07/19 1313  Resp 22 08/07/19 1313  SpO2 98 % 08/07/19 1313  Vitals shown include unvalidated device data.  Last Pain:  Vitals:   08/07/19 1311  TempSrc: Axillary  PainSc: 0-No pain      Patients Stated Pain Goal: 0 (17/91/50 5697)  Complications: No apparent anesthesia complications

## 2019-08-07 NOTE — CV Procedure (Signed)
Procedure: TEE  Indication: Atrial fibrillation  Sedation: Per anesthesiology  Findings: Please see echo section for full report.  Mildly dilated LV with EF 15-20%, diffuse hypokinesis.  Mild LV hypertrophy.  Mildly dilated RV with moderately decreased systolic function.  Moderate left atrial enlargement.  There is a large left atrial appendage thrombus noted.  Mild right atrial enlargement.  Very small PFO noted by color doppler.  Trivial TR, peak RV-RA gradient 15 mmHg.  Mild mitral regurgitation.  Trileaflet aortic valve with no stenosis or regurgitation.  Trivial pericardial effusion.  Small pleural effusion noted.  Normal caliber thoracic aorta with grade 3 plaque.   Impression: LA appendage thrombus noted, no DCCV (will wait 1 month on full anticoagulation and re-look).   Loralie Champagne 08/07/2019 1:05 PM

## 2019-08-07 NOTE — Care Management (Signed)
08-07-19 1012 Benefits Check submitted for Eliquis and Entresto. Case Manager will follow for cost and make patient aware. Bethena Roys, RN, BSN Case Manager [phone]

## 2019-08-07 NOTE — Care Management (Signed)
Per Bruce W/[facility]@ [phone]  Eliquis 2.5mg  twice a day  and Entresto 24-26 mg twice a day zero co-pay.  No PA Required No deductible [place] : [facility] or  any [facility].

## 2019-08-07 NOTE — Progress Notes (Signed)
ANTICOAGULATION CONSULT NOTE -   Pharmacy Consult for Heparin > Eliquis Indication: atrial fibrillation   No Known Allergies  Patient Measurements: Height: 5\' 9"  (175.3 cm) Weight: 200 lb 8 oz (90.9 kg) IBW/kg (Calculated) : 70.7 Heparin Dosing Weight: 91 kg  Vital Signs: Temp: 97.8 F (36.6 C) (02/22 1354) Temp Source: Oral (02/22 1354) BP: 108/83 (02/22 1354) Pulse Rate: 94 (02/22 1402)  Labs: Recent Labs    08/05/19 0559 08/05/19 1051 08/05/19 1051 08/06/19 0511 08/07/19 0342 08/07/19 0500  HGB  --  13.7   < > 12.9* 13.4  --   HCT  --  42.3  --  39.1 41.1  --   PLT  --  146*  --  129* 165  --   LABPROT  --   --   --   --   --  16.1*  INR  --   --   --   --   --  1.3*  CREATININE 1.51*  --   --  1.33* 1.39*  --    < > = values in this interval not displayed.    Estimated Creatinine Clearance: 47.2 mL/min (A) (by C-G formula based on SCr of 1.39 mg/dL (H)).   Medical History: Past Medical History:  Diagnosis Date  . Hyperlipemia   . Hypertension      Assessment: 79 yoM with new onset AFib and HF started on IV heparin. CHADSVASc = 4, no AC PTA.   Has been receiving apixaban 2.5 mg BID given age 79 years and Scr>1.5. CBC stable. No s/sx of bleeding. Scr 1.39 today. TEE showing LA appendage thrombus - plan for no DCCV and wait 1 month on full anticoagulation for re-look.   Goal of Therapy:  Monitor platelets by anticoagulation protocol: Yes   Plan:  Increase apixaban to 5 mg BID Monitor CBC, Scr, and for s/sx of bleeding   Antonietta Jewel, PharmD, [place] Pharmacist  Phone: [phone]  Please check AMION for all [place] phone numbers After 10:00 PM, call [facility] [phone]  08/07/2019 2:23 PM

## 2019-08-07 NOTE — Discharge Instructions (Addendum)
Heart Failure Action Plan A heart failure action plan helps you understand what to do when you have symptoms of heart failure. Follow the plan that was created by you and your health care provider. Review your plan each time you visit your health care provider. Red zone These signs and symptoms mean you should get medical help right away:  You have trouble breathing when resting.  You have a dry cough that is getting worse.  You have swelling or pain in your legs or abdomen that is getting worse.  You suddenly gain more than 2-3 lb (0.9-1.4 kg) in a day, or more than 5 lb (2.3 kg) in one week. This amount may be more or less depending on your condition.  You have trouble staying awake or you feel confused.  You have chest pain.  You do not have an appetite.  You pass out. If you experience any of these symptoms:  Call your local emergency services (911 in the [place] right away or seek help at the emergency department of the nearest hospital. Yellow zone These signs and symptoms mean your condition may be getting worse and you should make some changes:  You have trouble breathing when you are active or you need to sleep with extra pillows.  You have swelling in your legs or abdomen.  You gain 2-3 lb (0.9-1.4 kg) in one day, or 5 lb (2.3 kg) in one week. This amount may be more or less depending on your condition.  You get tired easily.  You have trouble sleeping.  You have a dry cough. If you experience any of these symptoms:  Contact your health care provider within the next day.  Your health care provider may adjust your medicines. Green zone These signs mean you are doing well and can continue what you are doing:  You do not have shortness of breath.  You have very little swelling or no new swelling.  Your weight is stable (no gain or loss).  You have a normal activity level.  You do not have chest pain or any other new symptoms. Follow these instructions at  home:  Take over-the-counter and prescription medicines only as told by your health care provider.  Weigh yourself daily. Your target weight is __________ lb (__________ kg). ? Call your health care provider if you gain more than __________ lb (__________ kg) in a day, or more than __________ lb (__________ kg) in one week.  Eat a heart-healthy diet. Work with a diet and nutrition specialist (dietitian) to create an eating plan that is best for you.  Keep all follow-up visits as told by your health care provider. This is important. Where to find more information  American Heart Association: www.heart.org Summary  Follow the action plan that was created by you and your health care provider.  Get help right away if you have any symptoms in the Red zone. This information is not intended to replace advice given to you by your health care provider. Make sure you discuss any questions you have with your health care provider. Document Revised: 05/14/2017 Document Reviewed: 07/11/2016 Elsevier Patient Education  [place].    Daily Weight Record It is important to weigh yourself daily. To do this:  Make sure you use a reliable scale. Use the same scale each day.  Keep this daily weight chart near your scale.  Weigh yourself each morning at the same time.  Before weighing yourself: ? Take off your shoes. ? Make sure  you are wearing the same amount of clothing each day.  Write down your weight in the spaces on the form.  Compare today's weight to yesterday's weight.  Bring this form with you to your follow-up visits with your health care provider. Call your health care provider if you have concerns about your weight, including rapid weight gain or loss. Date: ________ Weight: ____________________ Date: ________ Weight: ____________________ Date: ________ Weight: ____________________ Date: ________ Weight: ____________________ Date: ________ Weight: ____________________  Date: ________ Weight: ____________________ Date: ________ Weight: ____________________ Date: ________ Weight: ____________________ Date: ________ Weight: ____________________ Date: ________ Weight: ____________________ Date: ________ Weight: ____________________ Date: ________ Weight: ____________________ Date: ________ Weight: ____________________ Date: ________ Weight: ____________________ Date: ________ Weight: ____________________ Date: ________ Weight: ____________________ Date: ________ Weight: ____________________ Date: ________ Weight: ____________________ Date: ________ Weight: ____________________ Date: ________ Weight: ____________________ Date: ________ Weight: ____________________ Date: ________ Weight: ____________________ Date: ________ Weight: ____________________ Date: ________ Weight: ____________________ Date: ________ Weight: ____________________ Date: ________ Weight: ____________________ Date: ________ Weight: ____________________ Date: ________ Weight: ____________________ Date: ________ Weight: ____________________ Date: ________ Weight: ____________________ Date: ________ Weight: ____________________ Date: ________ Weight: ____________________ Date: ________ Weight: ____________________ Date: ________ Weight: ____________________ Date: ________ Weight: ____________________ Date: ________ Weight: ____________________ Date: ________ Weight: ____________________ Date: ________ Weight: ____________________ Date: ________ Weight: ____________________ Date: ________ Weight: ____________________ Date: ________ Weight: ____________________ Date: ________ Weight: ____________________ Date: ________ Weight: ____________________ Date: ________ Weight: ____________________ Date: ________ Weight: ____________________ Date: ________ Weight: ____________________ Date: ________ Weight: ____________________ Date: ________ Weight: ____________________ Date: ________ Weight:  ____________________ Date: ________ Weight: ____________________ This information is not intended to replace advice given to you by your health care provider. Make sure you discuss any questions you have with your health care provider. Document Revised: 05/31/2017 Document Reviewed: 05/31/2017 Elsevier Patient Education  [place].  Low-Sodium Eating Plan Sodium, which is an element that makes up salt, helps you maintain a healthy balance of fluids in your body. Too much sodium can increase your blood pressure and cause fluid and waste to be held in your body. Your health care provider or dietitian may recommend following this plan if you have high blood pressure (hypertension), kidney disease, liver disease, or heart failure. Eating less sodium can help lower your blood pressure, reduce swelling, and protect your heart, liver, and kidneys. What are tips for following this plan? General guidelines  Most people on this plan should limit their sodium intake to 1,500-2,000 mg (milligrams) of sodium each day. Reading food labels   The Nutrition Facts label lists the amount of sodium in one serving of the food. If you eat more than one serving, you must multiply the listed amount of sodium by the number of servings.  Choose foods with less than 140 mg of sodium per serving.  Avoid foods with 300 mg of sodium or more per serving. Shopping  Look for lower-sodium products, often labeled as "low-sodium" or "no salt added."  Always check the sodium content even if foods are labeled as "unsalted" or "no salt added".  Buy fresh foods. ? Avoid canned foods and premade or frozen meals. ? Avoid canned, cured, or processed meats  Buy breads that have less than 80 mg of sodium per slice. Cooking  Eat more home-cooked food and less restaurant, buffet, and fast food.  Avoid adding salt when cooking. Use salt-free seasonings or herbs instead of table salt or sea salt. Check with your health  care provider or pharmacist before using salt substitutes.  Cook with plant-based oils, such as canola, sunflower,  or olive oil. Meal planning  When eating at a restaurant, ask that your food be prepared with less salt or no salt, if possible.  Avoid foods that contain MSG (monosodium glutamate). MSG is sometimes added to [place] food, bouillon, and some canned foods. What foods are recommended? The items listed may not be a complete list. Talk with your dietitian about what dietary choices are best for you. Grains Low-sodium cereals, including oats, puffed wheat and rice, and shredded wheat. Low-sodium crackers. Unsalted rice. Unsalted pasta. Low-sodium bread. Whole-grain breads and whole-grain pasta. Vegetables Fresh or frozen vegetables. "No salt added" canned vegetables. "No salt added" tomato sauce and paste. Low-sodium or reduced-sodium tomato and vegetable juice. Fruits Fresh, frozen, or canned fruit. Fruit juice. Meats and other protein foods Fresh or frozen (no salt added) meat, poultry, seafood, and fish. Low-sodium canned tuna and salmon. Unsalted nuts. Dried peas, beans, and lentils without added salt. Unsalted canned beans. Eggs. Unsalted nut butters. Dairy Milk. Soy milk. Cheese that is naturally low in sodium, such as ricotta cheese, fresh mozzarella, or Swiss cheese Low-sodium or reduced-sodium cheese. Cream cheese. Yogurt. Fats and oils Unsalted butter. Unsalted margarine with no trans fat. Vegetable oils such as canola or olive oils. Seasonings and other foods Fresh and dried herbs and spices. Salt-free seasonings. Low-sodium mustard and ketchup. Sodium-free salad dressing. Sodium-free light mayonnaise. Fresh or refrigerated horseradish. Lemon juice. Vinegar. Homemade, reduced-sodium, or low-sodium soups. Unsalted popcorn and pretzels. Low-salt or salt-free chips. What foods are not recommended? The items listed may not be a complete list. Talk with your dietitian about  what dietary choices are best for you. Grains Instant hot cereals. Bread stuffing, pancake, and biscuit mixes. Croutons. Seasoned rice or pasta mixes. Noodle soup cups. Boxed or frozen macaroni and cheese. Regular salted crackers. Self-rising flour. Vegetables Sauerkraut, pickled vegetables, and relishes. Olives. [place] fries. Onion rings. Regular canned vegetables (not low-sodium or reduced-sodium). Regular canned tomato sauce and paste (not low-sodium or reduced-sodium). Regular tomato and vegetable juice (not low-sodium or reduced-sodium). Frozen vegetables in sauces. Meats and other protein foods Meat or fish that is salted, canned, smoked, spiced, or pickled. Bacon, ham, sausage, hotdogs, corned beef, chipped beef, packaged lunch meats, salt pork, jerky, pickled herring, anchovies, regular canned tuna, sardines, salted nuts. Dairy Processed cheese and cheese spreads. Cheese curds. Blue cheese. Feta cheese. String cheese. Regular cottage cheese. Buttermilk. Canned milk. Fats and oils Salted butter. Regular margarine. Ghee. Bacon fat. Seasonings and other foods Onion salt, garlic salt, seasoned salt, table salt, and sea salt. Canned and packaged gravies. Worcestershire sauce. Tartar sauce. Barbecue sauce. Teriyaki sauce. Soy sauce, including reduced-sodium. Steak sauce. Fish sauce. Oyster sauce. Cocktail sauce. Horseradish that you find on the shelf. Regular ketchup and mustard. Meat flavorings and tenderizers. Bouillon cubes. Hot sauce and Tabasco sauce. Premade or packaged marinades. Premade or packaged taco seasonings. Relishes. Regular salad dressings. Salsa. Potato and tortilla chips. Corn chips and puffs. Salted popcorn and pretzels. Canned or dried soups. Pizza. Frozen entrees and pot pies. Summary  Eating less sodium can help lower your blood pressure, reduce swelling, and protect your heart, liver, and kidneys.  Most people on this plan should limit their sodium intake to 1,500-2,000 mg  (milligrams) of sodium each day.  Canned, boxed, and frozen foods are high in sodium. Restaurant foods, fast foods, and pizza are also very high in sodium. You also get sodium by adding salt to food.  Try to cook at home, eat more fresh fruits and vegetables,  and eat less fast food, canned, processed, or prepared foods. This information is not intended to replace advice given to you by your health care provider. Make sure you discuss any questions you have with your health care provider. Document Revised: 05/14/2017 Document Reviewed: 05/25/2016 Elsevier Patient Education  [place], Adult An indwelling urinary catheter is a thin tube that is put into your bladder. The tube helps to drain pee (urine) out of your body. The tube goes in through your urethra. Your urethra is where pee comes out of your body. Your pee will come out through the catheter, then it will go into a bag (drainage bag). Take good care of your catheter so it will work well. How to wear your catheter and bag Supplies needed  Sticky tape (adhesive tape) or a leg strap.  Alcohol wipe or soap and water (if you use tape).  A clean towel (if you use tape).  Large overnight bag.  Smaller bag (leg bag). Wearing your catheter Attach your catheter to your leg with tape or a leg strap.  Make sure the catheter is not pulled tight.  If a leg strap gets wet, take it off and put on a dry strap.  If you use tape to hold the bag on your leg: 1. Use an alcohol wipe or soap and water to wash your skin where the tape made it sticky before. 2. Use a clean towel to pat-dry that skin. 3. Use new tape to make the bag stay on your leg. Wearing your bags You should have been given a large overnight bag.  You may wear the overnight bag in the day or night.  Always have the overnight bag lower than your bladder.  Do not let the bag touch the floor.  Before you go to sleep, put a clean  plastic bag in a wastebasket. Then hang the overnight bag inside the wastebasket. You should also have a smaller leg bag that fits under your clothes.  Always wear the leg bag below your knee.  Do not wear your leg bag at night. How to care for your skin and catheter Supplies needed  A clean washcloth.  Water and mild soap.  A clean towel. Caring for your skin and catheter      Clean the skin around your catheter every day: 1. Wash your hands with soap and water. 2. Wet a clean washcloth in warm water and mild soap. 3. Clean the skin around your urethra.  If you are male:  Gently spread the folds of skin around your vagina (labia).  With the washcloth in your other hand, wipe the inner side of your labia on each side. Wipe from front to back.  If you are male:  Pull back any skin that covers the end of your penis (foreskin).  With the washcloth in your other hand, wipe your penis in small circles. Start wiping at the tip of your penis, then move away from the catheter.  Move the foreskin back in place, if needed. 4. With your free hand, hold the catheter close to where it goes into your body.  Keep holding the catheter during cleaning so it does not get pulled out. 5. With the washcloth in your other hand, clean the catheter.  Only wipe downward on the catheter.  Do not wipe upward toward your body. Doing this may push germs into your urethra and cause infection. 6. Use a clean towel to pat-dry the  catheter and the skin around it. Make sure to wipe off all soap. 7. Wash your hands with soap and water.  Shower every day. Do not take baths.  Do not use cream, ointment, or lotion on the area where the catheter goes into your body, unless your doctor tells you to.  Do not use powders, sprays, or lotions on your genital area.  Check your skin around the catheter every day for signs of infection. Check for: ? Redness, swelling, or pain. ? Fluid or  blood. ? Warmth. ? Pus or a bad smell. How to empty the bag Supplies needed  Rubbing alcohol.  Gauze pad or cotton ball.  Tape or a leg strap. Emptying the bag Pour the pee out of your bag when it is ?- full, or at least 2-3 times a day. Do this for your overnight bag and your leg bag. 1. Wash your hands with soap and water. 2. Separate (detach) the bag from your leg. 3. Hold the bag over the toilet or a clean pail. Keep the bag lower than your hips and bladder. This is so the pee (urine) does not go back into the tube. 4. Open the pour spout. It is at the bottom of the bag. 5. Empty the pee into the toilet or pail. Do not let the pour spout touch any surface. 6. Put rubbing alcohol on a gauze pad or cotton ball. 7. Use the gauze pad or cotton ball to clean the pour spout. 8. Close the pour spout. 9. Attach the bag to your leg with tape or a leg strap. 10. Wash your hands with soap and water. Follow instructions for cleaning the drainage bag:  From the product maker.  As told by your doctor. How to change the bag Supplies needed  Alcohol wipes.  A clean bag.  Tape or a leg strap. Changing the bag Replace your bag when it starts to leak, smell bad, or look dirty. 1. Wash your hands with soap and water. 2. Separate the dirty bag from your leg. 3. Pinch the catheter with your fingers so that pee does not spill out. 4. Separate the catheter tube from the bag tube where these tubes connect (at the connection valve). Do not let the tubes touch any surface. 5. Clean the end of the catheter tube with an alcohol wipe. Use a different alcohol wipe to clean the end of the bag tube. 6. Connect the catheter tube to the tube of the clean bag. 7. Attach the clean bag to your leg with tape or a leg strap. Do not make the bag tight on your leg. 8. Wash your hands with soap and water. General rules   Never pull on your catheter. Never try to take it out. Doing that can hurt  you.  Always wash your hands before and after you touch your catheter or bag. Use a mild, fragrance-free soap. If you do not have soap and water, use hand sanitizer.  Always make sure there are no twists or bends (kinks) in the catheter tube.  Always make sure there are no leaks in the catheter or bag.  Drink enough fluid to keep your pee pale yellow.  Do not take baths, swim, or use a hot tub.  If you are male, wipe from front to back after you poop (have a bowel movement). Contact a doctor if:  Your pee is cloudy.  Your pee smells worse than usual.  Your catheter gets clogged.  Your catheter  leaks.  Your bladder feels full. Get help right away if:  You have redness, swelling, or pain where the catheter goes into your body.  You have fluid, blood, pus, or a bad smell coming from the area where the catheter goes into your body.  Your skin feels warm where the catheter goes into your body.  You have a fever.  You have pain in your: ? Belly (abdomen). ? Legs. ? Lower back. ? Bladder.  You see blood in the catheter.  Your pee is pink or red.  You feel sick to your stomach (nauseous).  You throw up (vomit).  You have chills.  Your pee is not draining into the bag.  Your catheter gets pulled out. Summary  An indwelling urinary catheter is a thin tube that is placed into the bladder to help drain pee (urine) out of the body.  The catheter is placed into the part of the body that drains pee from the bladder (urethra).  Taking good care of your catheter will keep it working properly and help prevent problems.  Always wash your hands before and after touching your catheter or bag.  Never pull on your catheter or try to take it out. This information is not intended to replace advice given to you by your health care provider. Make sure you discuss any questions you have with your health care provider. Document Revised: 09/23/2018 Document Reviewed:  01/15/2017 Elsevier Patient Education  [place] on my medicine - ELIQUIS (apixaban)  Why was Eliquis prescribed for you? Eliquis was prescribed for you to reduce the risk of a blood clot forming that can cause a stroke if you have a medical condition called atrial fibrillation (a type of irregular heartbeat).  What do You need to know about Eliquis ? Take your Eliquis TWICE DAILY - one tablet in the morning and one tablet in the evening with or without food. If you have difficulty swallowing the tablet whole please discuss with your pharmacist how to take the medication safely.  Take Eliquis exactly as prescribed by your doctor and DO NOT stop taking Eliquis without talking to the doctor who prescribed the medication.  Stopping may increase your risk of developing a stroke.  Refill your prescription before you run out.  After discharge, you should have regular check-up appointments with your healthcare provider that is prescribing your Eliquis.  In the future your dose may need to be changed if your kidney function or weight changes by a significant amount or as you get older.  What do you do if you miss a dose? If you miss a dose, take it as soon as you remember on the same day and resume taking twice daily.  Do not take more than one dose of ELIQUIS at the same time to make up a missed dose.  Important Safety Information A possible side effect of Eliquis is bleeding. You should call your healthcare provider right away if you experience any of the following: ? Bleeding from an injury or your nose that does not stop. ? Unusual colored urine (red or dark brown) or unusual colored stools (red or black). ? Unusual bruising for unknown reasons. ? A serious fall or if you hit your head (even if there is no bleeding).  Some medicines may interact with Eliquis and might increase your risk of bleeding or clotting while on Eliquis. To help avoid this, consult your  healthcare provider or pharmacist prior to using any new prescription or  non-prescription medications, including herbals, vitamins, non-steroidal anti-inflammatory drugs (NSAIDs) and supplements.  This website has more information on Eliquis (apixaban): http://www.eliquis.com/eliquis/home

## 2019-08-07 NOTE — Anesthesia Postprocedure Evaluation (Signed)
Anesthesia Post Note  Patient: Chris Guerrero  Procedure(s) Performed: TRANSESOPHAGEAL ECHOCARDIOGRAM (TEE) (N/A )     Patient location during evaluation: Endoscopy Anesthesia Type: MAC Level of consciousness: awake and alert Pain management: pain level controlled Vital Signs Assessment: post-procedure vital signs reviewed and stable Respiratory status: spontaneous breathing, nonlabored ventilation, respiratory function stable and patient connected to nasal cannula oxygen Cardiovascular status: stable and blood pressure returned to baseline Postop Assessment: no apparent nausea or vomiting Anesthetic complications: no    Last Vitals:  Vitals:   08/07/19 1402 08/07/19 1654  BP:    Pulse: 94 88  Resp:    Temp:  36.5 C  SpO2:  95%    Last Pain:  Vitals:   08/07/19 1654  TempSrc: Oral  PainSc:                  Briggitte Boline COKER

## 2019-08-07 NOTE — Progress Notes (Addendum)
  Echocardiogram Echocardiogram Transesophageal has been performed.  Jennette Dubin 08/07/2019, 1:09 PM

## 2019-08-07 NOTE — Plan of Care (Signed)
  Problem: Education: Goal: Knowledge of General Education information will improve Description: Including pain rating scale, medication(s)/side effects and non-pharmacologic comfort measures Outcome: Progressing   Problem: Clinical Measurements: Goal: Respiratory complications will improve Outcome: Progressing   Problem: Nutrition: Goal: Adequate nutrition will be maintained Outcome: Progressing   Problem: Coping: Goal: Level of anxiety will decrease Outcome: Progressing   Problem: Pain Managment: Goal: General experience of comfort will improve Outcome: Progressing   Problem: Safety: Goal: Ability to remain free from injury will improve Outcome: Progressing   

## 2019-08-07 NOTE — Anesthesia Preprocedure Evaluation (Signed)
Anesthesia Evaluation  Patient identified by MRN, date of birth, ID band Patient awake    Reviewed: Allergy & Precautions, NPO status , Patient's Chart, lab work & pertinent test results  Airway Mallampati: II  TM Distance: >3 FB Neck ROM: Full    Dental  (+) Teeth Intact, Dental Advisory Given   Pulmonary     + decreased breath sounds      Cardiovascular hypertension,  Rhythm:Irregular Rate:Normal     Neuro/Psych    GI/Hepatic   Endo/Other    Renal/GU      Musculoskeletal   Abdominal   Peds  Hematology   Anesthesia Other Findings   Reproductive/Obstetrics                             Anesthesia Physical Anesthesia Plan  ASA: III  Anesthesia Plan: MAC   Post-op Pain Management:    Induction: Intravenous  PONV Risk Score and Plan: Ondansetron and Propofol infusion  Airway Management Planned: Natural Airway and Simple Face Mask  Additional Equipment:   Intra-op Plan:   Post-operative Plan:   Informed Consent: I have reviewed the patients History and Physical, chart, labs and discussed the procedure including the risks, benefits and alternatives for the proposed anesthesia with the patient or authorized representative who has indicated his/her understanding and acceptance.     Dental advisory given  Plan Discussed with: CRNA and Anesthesiologist  Anesthesia Plan Comments:         Anesthesia Quick Evaluation

## 2019-08-07 NOTE — H&P (View-Only) (Signed)
Patient ID: Chris Guerrero, male   DOB: 24-Dec-1940, 79 y.o.   MRN: [id number]     Advanced Heart Failure Rounding Note  PCP-Cardiologist: Kate Sable, MD   Subjective:    Patient remains on milrinone 0.25, co-ox 61%. CVP down 10-11. Weight down another 9 pounds.   Urine has cleared.   Denies SOB.     RHC/LHC:  Coronary Findings  Diagnostic Dominance: Right Left Main  No significant coronary disease.  Left Anterior Descending  No significant coronary disease.  Left Circumflex  No significant coronary disease.  Right Coronary Artery  No significant coronary disease.  Intervention  No interventions have been documented. Right Heart  Right Heart Pressures RHC Procedural Findings (milrinone 0.375): Hemodynamics (mmHg) RA mean 8 RV 38/8 PA 41/14, mean 29 PCWP mean 19 LV 76/14 AO 81/42  Oxygen saturations: PA 75% AO 98%  Cardiac Output (Fick) 6.44  Cardiac Index (Fick) 3.02 PVR 1.6 WU  30 cc contrast      Objective:   Weight Range: 90.9 kg Body mass index is 29.61 kg/m.   Vital Signs:   Temp:  [97.4 F (36.3 C)-98.3 F (36.8 C)] 97.8 F (36.6 C) (02/22 0332) Pulse Rate:  [101-106] 101 (02/22 0332) Resp:  [17-25] 17 (02/22 0057) BP: (92-124)/(72-100) 97/73 (02/22 0332) SpO2:  [90 %-98 %] 90 % (02/22 0332) Weight:  [90.9 kg] 90.9 kg (02/22 0332) Last BM Date: 08/06/19  Weight change: Filed Weights   08/05/19 0532 08/06/19 0424 08/07/19 0332  Weight: 97 kg 94.8 kg 90.9 kg    Intake/Output:   Intake/Output Summary (Last 24 hours) at 08/07/2019 0721 Last data filed at 08/07/2019 0429 Gross per 24 hour  Intake 1222.61 ml  Output 3950 ml  Net -2727.39 ml      Physical Exam  CVP 10-11 General: No resp difficulty HEENT: normal Neck: supple. JVP 10-11. Carotids 2+ bilat; no bruits. No lymphadenopathy or thryomegaly appreciated. Cor: PMI nondisplaced. Irregular rate & rhythm. No rubs, gallops or murmurs. Lungs: clear Abdomen: soft,  nontender, nondistended. No hepatosplenomegaly. No bruits or masses. Good bowel sounds. Extremities: no cyanosis, clubbing, rash, R and LLE 1+ edema. RUE PICC  Neuro: alert & orientedx3, cranial nerves grossly intact. moves all 4 extremities w/o difficulty. Affect pleasant GU: Foley yellow urine    Telemetry    A fib 80-90s   Labs    CBC Recent Labs    08/06/19 0511 08/07/19 0342  WBC 6.4 7.2  NEUTROABS 3.5 4.4  HGB 12.9* 13.4  HCT 39.1 41.1  MCV 90.7 90.7  PLT 129* 809   Basic Metabolic Panel Recent Labs    08/06/19 0511 08/07/19 0342  NA 136 136  K 3.6 3.9  CL 100 93*  CO2 28 30  GLUCOSE 95 122*  BUN 15 15  CREATININE 1.33* 1.39*  CALCIUM 8.3* 8.9  MG 1.6* 2.0   Liver Function Tests No results for input(s): AST, ALT, ALKPHOS, BILITOT, PROT, ALBUMIN in the last 72 hours. No results for input(s): LIPASE, AMYLASE in the last 72 hours. Cardiac Enzymes No results for input(s): CKTOTAL, CKMB, CKMBINDEX, TROPONINI in the last 72 hours.  BNP: BNP (last 3 results) Recent Labs    08/01/19 1721  BNP 1,068.0*    ProBNP (last 3 results) No results for input(s): PROBNP in the last 8760 hours.   D-Dimer No results for input(s): DDIMER in the last 72 hours. Hemoglobin A1C No results for input(s): HGBA1C in the last 72 hours. Fasting Lipid Panel No  results for input(s): CHOL, HDL, LDLCALC, TRIG, CHOLHDL, LDLDIRECT in the last 72 hours. Thyroid Function Tests No results for input(s): TSH, T4TOTAL, T3FREE, THYROIDAB in the last 72 hours.  Invalid input(s): FREET3  Other results:   Imaging    No results found.   Medications:     Scheduled Medications: . apixaban  2.5 mg Oral BID  . atorvastatin  10 mg Oral QPM  . Chlorhexidine Gluconate Cloth  6 each Topical Daily  . digoxin  0.125 mg Oral Daily  . furosemide  80 mg Intravenous BID  . losartan  25 mg Oral BID  . sodium chloride flush  10-40 mL Intracatheter Q12H  . sodium chloride flush  3 mL  Intravenous Q12H  . spironolactone  12.5 mg Oral Daily  . traZODone  50 mg Oral QHS    Infusions: . sodium chloride    . sodium chloride 10 mL/hr at 08/03/19 0437  . sodium chloride    . amiodarone 30 mg/hr (08/06/19 2109)  . milrinone 0.25 mcg/kg/min (08/06/19 2110)    PRN Medications: sodium chloride, acetaminophen, acetaminophen, ondansetron (ZOFRAN) IV, sodium chloride flush, sodium chloride flush    Assessment/Plan   1. Acute systolic CHF: Echo this admission with EF <20%, severely decreased RV function. Nonischemic cardiomyopathy, ?tachycardia-mediated.  No prior echo or history of CHF.  He has been symptomatic for about 1 month with edema and exertional dyspnea.  He was in atrial fibrillation with RVR at presentation.  Milrinone started with low co-ox.  RHC/LHC 2/19 with no coronary disease, mildly elevated filling pressures.  TCVP down to 10-11 - Lasix 80 mg IV bid today - Cut back milrinone 0.125 mcg.    - Continue digoxin 0.125 daily.  - Continue spironolactone 12.5 daily.   - Stop losartan. Start entresto 24-26 mg twice a day.   - TEE-guided DCCV today  - Consider cardiac MRI when back in NSR.  2. Atrial fibrillation: Admitted with afib/RVR of uncertain duration.  Unsure if atrial fibrillation/RVR was cause of CMP (tachy-mediated) or an effect of the cardiomyopathy.  Regardless, he will need restoration of NSR.  - Continue amiodarone gtt for rate control.  - Continue apixaban.   - TEE-guided DCCV today  3. AKI on ?CKD stage 3: Initial creatinine 1.5, up to 1.9 initially and now 1.39.  Suspect cardiorenal syndrome.  4. Hematuria: Difficult foley insertion with BPH on 2/19.   Urine has cleared. Plan to keep foley 4-5 days before voiding trial.  He has been followed by Dr Adah Salvage in the community.   Length of Stay: 6  Amy Clegg, NP  08/07/2019, 7:21 AM  Advanced Heart Failure Team Pager [phone] (M-F; 7a - 4p)  Please contact [facility] for night-coverage after  hours (4p -7a ) and weekends on [email or web address]  Patient seen with NP, agree with the above note.    He is doing well today, co-ox 61%.  CVP 10-11.  Very good diuresis yesterday.  Creatinine down to 1.39.   General: NAD Neck: JVP 9-10 cm, no thyromegaly or thyroid nodule.  Lungs: Clear to auscultation bilaterally with normal respiratory effort. CV: Nondisplaced PMI.  Heart irregular S1/S2, no S3/S4, no murmur.  1+ edema 3/4 to knees.    Abdomen: Soft, nontender, no hepatosplenomegaly, no distention.  Skin: Intact without lesions or rashes.  Neurologic: Alert and oriented x 3.  Psych: Normal affect. Extremities: No clubbing or cyanosis.  HEENT: Normal.   Decrease milrinone to 0.125 and will give Lasix 80 mg IV  bid again today. Will aim to turn milrinone off prior to DCCV.  Agree with transition to [facility].   He remains in atrial fibrillation, plan TEE-guided DCCV today. Discussed risks/benefits with patient and he agrees.   Loralie Champagne 08/07/2019 8:15 AM

## 2019-08-08 LAB — CBC WITH DIFFERENTIAL/PLATELET
Abs Immature Granulocytes: 0.01 10*3/uL (ref 0.00–0.07)
Basophils Absolute: 0 10*3/uL (ref 0.0–0.1)
Basophils Relative: 0 %
Eosinophils Absolute: 0.3 10*3/uL (ref 0.0–0.5)
Eosinophils Relative: 4 %
HCT: 43.2 % (ref 39.0–52.0)
Hemoglobin: 14.4 g/dL (ref 13.0–17.0)
Immature Granulocytes: 0 %
Lymphocytes Relative: 24 %
Lymphs Abs: 1.7 10*3/uL (ref 0.7–4.0)
MCH: 29.7 pg (ref 26.0–34.0)
MCHC: 33.3 g/dL (ref 30.0–36.0)
MCV: 89.1 fL (ref 80.0–100.0)
Monocytes Absolute: 1 10*3/uL (ref 0.1–1.0)
Monocytes Relative: 14 %
Neutro Abs: 4.2 10*3/uL (ref 1.7–7.7)
Neutrophils Relative %: 58 %
Platelets: 178 10*3/uL (ref 150–400)
RBC: 4.85 MIL/uL (ref 4.22–5.81)
RDW: 14.6 % (ref 11.5–15.5)
WBC: 7.2 10*3/uL (ref 4.0–10.5)
nRBC: 0 % (ref 0.0–0.2)

## 2019-08-08 LAB — BASIC METABOLIC PANEL
Anion gap: 13 (ref 5–15)
BUN: 15 mg/dL (ref 8–23)
CO2: 30 mmol/L (ref 22–32)
Calcium: 9.3 mg/dL (ref 8.9–10.3)
Chloride: 92 mmol/L — ABNORMAL LOW (ref 98–111)
Creatinine, Ser: 1.35 mg/dL — ABNORMAL HIGH (ref 0.61–1.24)
GFR calc Af Amer: 57 mL/min — ABNORMAL LOW (ref >60)
GFR calc non Af Amer: 49 mL/min — ABNORMAL LOW (ref >60)
Glucose, Bld: 111 mg/dL — ABNORMAL HIGH (ref 70–99)
Potassium: 3.9 mmol/L (ref 3.5–5.1)
Sodium: 135 mmol/L (ref 135–145)

## 2019-08-08 LAB — COOXEMETRY PANEL
Carboxyhemoglobin: 1.7 % — ABNORMAL HIGH (ref 0.5–1.5)
Methemoglobin: 0.9 % (ref 0.0–1.5)
O2 Saturation: 84.3 %
Total hemoglobin: 14.6 g/dL (ref 12.0–16.0)

## 2019-08-08 MED ORDER — AMIODARONE HCL 200 MG PO TABS
200.0000 mg | ORAL_TABLET | Freq: Two times a day (BID) | ORAL | Status: DC
  Administered 2019-08-08 – 2019-08-09 (×3): 200 mg via ORAL
  Filled 2019-08-08 (×3): qty 1

## 2019-08-08 MED ORDER — TORSEMIDE 20 MG PO TABS
40.0000 mg | ORAL_TABLET | Freq: Every day | ORAL | Status: DC
  Administered 2019-08-09: 40 mg via ORAL
  Filled 2019-08-08: qty 2

## 2019-08-08 MED ORDER — PHENOL 1.4 % MT LIQD
1.0000 | OROMUCOSAL | Status: DC | PRN
  Administered 2019-08-08: 1 via OROMUCOSAL
  Filled 2019-08-08: qty 177

## 2019-08-08 NOTE — TOC Initial Note (Signed)
Transition of Care [facility]) - Initial/Assessment Note    Patient Details  Name: Chris Guerrero MRN: [id number] Date of Birth: 24-Oct-1940  Transition of Care [facility]) CM/SW Contact:    Angelita Ingles, RN Phone Number: 08/08/2019, 1:52 PM  Clinical Narrative:                 Patient educated on the role of home health agency. Patient states that he is from home alone and just retired January of this year. Case manager offered choices for home health physical therapy. Patient has declined at this moment and wants time to think about it. Patient states that he is able to self administer and understands his current medication regimen. Patient states that he is able to care for himself and drive himself around.  At this time patient is technically not home bound. Case manager will continue to monitor.No further needs have been identified at this time.  Expected Discharge Plan: Home/Self Care Barriers to Discharge: Continued Medical Work up   Patient Goals and CMS Choice        Expected Discharge Plan and Services Expected Discharge Plan: Home/Self Care In-house Referral: Nutrition     Living arrangements for the past 2 months: Mobile Home                   Prior Living Arrangements/Services Living arrangements for the past 2 months: Mobile Home Lives with:: Self Patient language and need for interpreter reviewed:: Yes Do you feel safe going back to the place where you live?: Yes      Need for Family Participation in Patient Care: No (Comment) Care giver support system in place?: Yes (comment)   Criminal Activity/Legal Involvement Pertinent to Current Situation/Hospitalization: No - Comment as needed  Activities of Daily Living Home Assistive Devices/Equipment: None ADL Screening (condition at time of admission) Patient's cognitive ability adequate to safely complete daily activities?: Yes Is the patient deaf or have difficulty hearing?: No Does the patient have difficulty seeing, even  when wearing glasses/contacts?: No Does the patient have difficulty concentrating, remembering, or making decisions?: No Patient able to express need for assistance with ADLs?: Yes Does the patient have difficulty dressing or bathing?: No Independently performs ADLs?: No Does the patient have difficulty walking or climbing stairs?: No Weakness of Legs: Both Weakness of Arms/Hands: None   Emotional Assessment   Attitude/Demeanor/Rapport: Engaged Affect (typically observed): Accepting, Appropriate Orientation: : Oriented to Self, Oriented to Place, Oriented to  Time, Oriented to Situation   Psych Involvement: No (comment)  Admission diagnosis:  CHF (congestive heart failure) (HCC) [I50.9] Acute pulmonary edema (HCC) [J81.0] Peripheral edema [R60.9] Atrial fibrillation with RVR ([place]) [I48.91] Patient Active Problem List   Diagnosis Date Noted  . Acute pulmonary edema (HCC)   . CHF (congestive heart failure) ([place]) 08/01/2019  . Atrial fibrillation with RVR ([place]) 08/01/2019  . Peripheral edema 08/01/2019  . Prolonged QT interval 08/01/2019  . Elevated brain natriuretic peptide (BNP) level 08/01/2019  . Essential hypertension 08/01/2019  . Hyperlipidemia 08/01/2019  . Elevated serum creatinine 08/01/2019  . History of colonic polyps   . Diverticulosis of colon without hemorrhage   . History of adenomatous polyp of colon 10/03/2015   PCP:  [name], MD Pharmacy:   [facility], [address] [place] [address] Phone: [phone] Fax: [phone]     Social Determinants of Health (SDOH) Interventions    Readmission Risk Interventions No flowsheet  data found.

## 2019-08-08 NOTE — Plan of Care (Signed)

## 2019-08-08 NOTE — Progress Notes (Signed)
Physical Therapy Treatment Patient Details Name: Chris Guerrero MRN: [id number] DOB: 27-Mar-1941 Today's Date: 08/08/2019    History of Present Illness Patient is an 79 year old male who was admitted with SOB, LE swelling x 3-4 weeks, found to be in Afib with RVR. PMH includes:HTN, HLD,  s/p cardiac cath and angiography 2/19.    PT Comments    Patient received in bed, pleasant and willing to work with therapy. See below for mobility/assist levels. Transferred to commode and able to have productive BM, then tolerated progression of gait training to approximately 26f today with RW and min guard. VSS on room air. He was left up in the chair with all needs met this afternoon. Progressing well with therapy.    Follow Up Recommendations  Home health PT     Equipment Recommendations  Rolling walker with 5" wheels    Recommendations for Other Services       Precautions / Restrictions Precautions Precautions: Fall Restrictions Weight Bearing Restrictions: No    Mobility  Bed Mobility Overal bed mobility: Independent                Transfers Overall transfer level: Needs assistance Equipment used: Rolling walker (2 wheeled) Transfers: Sit to/from Stand Sit to Stand: Supervision         General transfer comment: assist for line management, no physical assist given  Ambulation/Gait Ambulation/Gait assistance: Min guard Gait Distance (Feet): 200 Feet Assistive device: Rolling walker (2 wheeled) Gait Pattern/deviations: Step-through pattern;Trunk flexed Gait velocity: WNL   General Gait Details: steady, no difficulties reported, sats remained > 90% throughout session on room air.   Stairs             Wheelchair Mobility    Modified Rankin (Stroke Patients Only)       Balance Overall balance assessment: Mild deficits observed, not formally tested                                          Cognition Arousal/Alertness:  Awake/alert Behavior During Therapy: WFL for tasks assessed/performed Overall Cognitive Status: Within Functional Limits for tasks assessed                                        Exercises      General Comments        Pertinent Vitals/Pain Pain Assessment: No/denies pain    Home Living                      Prior Function            PT Goals (current goals can now be found in the care plan section) Acute Rehab PT Goals Patient Stated Goal: to return home PT Goal Formulation: With patient Time For Goal Achievement: 08/20/19 Potential to Achieve Goals: Good Progress towards PT goals: Progressing toward goals    Frequency    Min 3X/week      PT Plan Equipment recommendations need to be updated;Current plan remains appropriate    Co-evaluation              AM-PAC PT "6 Clicks" Mobility   Outcome Measure  Help needed turning from your back to your side while in a flat bed without using bedrails?: None Help needed moving from  lying on your back to sitting on the side of a flat bed without using bedrails?: None Help needed moving to and from a bed to a chair (including a wheelchair)?: A Little Help needed standing up from a chair using your arms (e.g., wheelchair or bedside chair)?: A Little Help needed to walk in hospital room?: A Little Help needed climbing 3-5 steps with a railing? : A Little 6 Click Score: 20    End of Session   Activity Tolerance: Patient tolerated treatment well Patient left: in chair;with call bell/phone within reach   PT Visit Diagnosis: Muscle weakness (generalized) (M62.81)     Time: 5910-2890 PT Time Calculation (min) (ACUTE ONLY): 28 min  Charges:  $Gait Training: 8-22 mins $Therapeutic Activity: 8-22 mins                     Windell Norfolk, DPT, PN1   Supplemental Physical Therapist Circle    Pager [phone] Acute Rehab Office [phone]

## 2019-08-08 NOTE — Progress Notes (Addendum)
Patient ID: Chris Guerrero, male   DOB: 06-09-1941, 79 y.o.   MRN: [id number]     Advanced Heart Failure Rounding Note  PCP-Cardiologist: Kate Sable, MD   Subjective:    Remains in a fib rate 60-80s. Remains on milrinone 0.125 mcg + amio drip 30 mg per hour. Weight down another 11 pounds. CVP 6.   Complaining of a little dizziness. Denies SOB.   2/22/ S/P TEE- LA appendage clot noted. No cardioversion.    RHC/LHC:  Coronary Findings  Diagnostic Dominance: Right Left Main  No significant coronary disease.  Left Anterior Descending  No significant coronary disease.  Left Circumflex  No significant coronary disease.  Right Coronary Artery  No significant coronary disease.  Intervention  No interventions have been documented. Right Heart  Right Heart Pressures RHC Procedural Findings (milrinone 0.375): Hemodynamics (mmHg) RA mean 8 RV 38/8 PA 41/14, mean 29 PCWP mean 19 LV 76/14 AO 81/42  Oxygen saturations: PA 75% AO 98%  Cardiac Output (Fick) 6.44  Cardiac Index (Fick) 3.02 PVR 1.6 WU  30 cc contrast      Objective:   Weight Range: 85.8 kg Body mass index is 27.94 kg/m.   Vital Signs:   Temp:  [97.7 F (36.5 C)-98.2 F (36.8 C)] 97.8 F (36.6 C) (02/23 0417) Pulse Rate:  [79-99] 88 (02/23 0417) Resp:  [19-28] 21 (02/22 1354) BP: (91-114)/(58-83) 91/63 (02/23 0417) SpO2:  [94 %-98 %] 95 % (02/23 0417) Weight:  [85.8 kg] 85.8 kg (02/23 0417) Last BM Date: 08/07/19  Weight change: Filed Weights   08/06/19 0424 08/07/19 0332 08/08/19 0417  Weight: 94.8 kg 90.9 kg 85.8 kg    Intake/Output:   Intake/Output Summary (Last 24 hours) at 08/08/2019 0724 Last data filed at 08/07/2019 2145 Gross per 24 hour  Intake 570 ml  Output 3425 ml  Net -2855 ml      Physical Exam  CVP 6 General:  Well appearing. No resp difficulty HEENT: normal Neck: supple. no JVD. Carotids 2+ bilat; no bruits. No lymphadenopathy or thryomegaly  appreciated. Cor: PMI nondisplaced. Irregular rate & rhythm. No rubs, gallops or murmurs. Lungs: clear Abdomen: soft, nontender, nondistended. No hepatosplenomegaly. No bruits or masses. Good bowel sounds. Extremities: no cyanosis, clubbing, rash, edema Neuro: alert & orientedx3, cranial nerves grossly intact. moves all 4 extremities w/o difficulty. Affect pleasant GU: ; Foley yellow urine   Telemetry    A fib 70-90s   Labs    CBC Recent Labs    08/07/19 0342 08/08/19 0228  WBC 7.2 7.2  NEUTROABS 4.4 4.2  HGB 13.4 14.4  HCT 41.1 43.2  MCV 90.7 89.1  PLT 165 841   Basic Metabolic Panel Recent Labs    08/06/19 0511 08/06/19 0511 08/07/19 0342 08/08/19 0228  NA 136   < > 136 135  K 3.6   < > 3.9 3.9  CL 100   < > 93* 92*  CO2 28   < > 30 30  GLUCOSE 95   < > 122* 111*  BUN 15   < > 15 15  CREATININE 1.33*   < > 1.39* 1.35*  CALCIUM 8.3*   < > 8.9 9.3  MG 1.6*  --  2.0  --    < > = values in this interval not displayed.   Liver Function Tests No results for input(s): AST, ALT, ALKPHOS, BILITOT, PROT, ALBUMIN in the last 72 hours. No results for input(s): LIPASE, AMYLASE in the last 72 hours.  Cardiac Enzymes No results for input(s): CKTOTAL, CKMB, CKMBINDEX, TROPONINI in the last 72 hours.  BNP: BNP (last 3 results) Recent Labs    08/01/19 1721  BNP 1,068.0*    ProBNP (last 3 results) No results for input(s): PROBNP in the last 8760 hours.   D-Dimer No results for input(s): DDIMER in the last 72 hours. Hemoglobin A1C No results for input(s): HGBA1C in the last 72 hours. Fasting Lipid Panel No results for input(s): CHOL, HDL, LDLCALC, TRIG, CHOLHDL, LDLDIRECT in the last 72 hours. Thyroid Function Tests No results for input(s): TSH, T4TOTAL, T3FREE, THYROIDAB in the last 72 hours.  Invalid input(s): FREET3  Other results:   Imaging    No results found.   Medications:     Scheduled Medications: . apixaban  5 mg Oral BID  . atorvastatin   10 mg Oral QPM  . Chlorhexidine Gluconate Cloth  6 each Topical Daily  . digoxin  0.125 mg Oral Daily  . furosemide  80 mg Intravenous BID  . sacubitril-valsartan  1 tablet Oral BID  . sodium chloride flush  10-40 mL Intracatheter Q12H  . sodium chloride flush  3 mL Intravenous Q12H  . spironolactone  12.5 mg Oral Daily  . traZODone  50 mg Oral QHS    Infusions: . sodium chloride Stopped (08/07/19 1313)  . sodium chloride 10 mL/hr at 08/03/19 0437  . amiodarone 30 mg/hr (08/08/19 0018)  . milrinone 0.125 mcg/kg/min (08/07/19 1445)    PRN Medications: sodium chloride, acetaminophen, acetaminophen, ondansetron (ZOFRAN) IV, phenol, sodium chloride flush, sodium chloride flush    Assessment/Plan   1. Acute systolic CHF: Echo this admission with EF <20%, severely decreased RV function. Nonischemic cardiomyopathy, ?tachycardia-mediated.  No prior echo or history of CHF.  He has been symptomatic for about 1 month with edema and exertional dyspnea.  He was in atrial fibrillation with RVR at presentation.  Milrinone started with low co-ox.  RHC/LHC 2/19 with no coronary disease, mildly elevated filling pressures.  Co-ox 84% today on milrinone 0.125.  - CVP down to 6-7. Stop IV lasix. Tomorrow start torsemide 40 mg daily. Having some dizziness.  - CO-OX stable. Stop milrinone.  - Continue digoxin 0.125 daily.  - Continue spironolactone 12.5 daily.   - Continue entresto 24-26 mg twice a day.   - Consider cardiac MRI when back in NSR.  2. Atrial fibrillation: Admitted with afib/RVR of uncertain duration.  Unsure if atrial fibrillation/RVR was cause of CMP (tachy-mediated) or an effect of the cardiomyopathy.  Regardless, he will need restoration of NSR.  - Stop amio drip. Start amio 200 mg twice a day.  - Continue apixaban.   - TEE 2/22 with LA appendage thrombus noted. Plan for repeat TEE-DCCV in 1 month after longer-term anticoagulation.  3. AKI on ?CKD stage 3: Initial creatinine 1.5, up to  1.9 initially and now 1.35 Suspect cardiorenal syndrome.  4. Hematuria: Difficult foley insertion with BPH on 2/19.   Urine has cleared. Plan to keep foley 4-5 days before voiding trial.  He has been followed by Dr Adah Salvage in the community.   Ambulate today.   Length of Stay: 7  Amy Clegg, NP  08/08/2019, 7:24 AM  Advanced Heart Failure Team Pager [phone] (M-F; 7a - 4p)  Please contact [facility] for night-coverage after hours (4p -7a ) and weekends on [email or web address]  Patient seen with NP, agree with the above note.   Unable to cardiovert yesterday due to LA appendage thrombus seen on TEE.  Today, CVP 6-7 after good diuresis again.  Creatinine stable at 1.35.  Co-ox 84% on milrinone 0.125.    He feels good, no dyspnea.   General: NAD Neck: JVP 8 cm, no thyromegaly or thyroid nodule.  Lungs: Clear to auscultation bilaterally with normal respiratory effort. CV: Nondisplaced PMI.  Heart irregular S1/S2, no S3/S4, no murmur.  No peripheral edema.   Abdomen: Soft, nontender, no hepatosplenomegaly, no distention.  Skin: Intact without lesions or rashes.  Neurologic: Alert and oriented x 3.  Psych: Normal affect. Extremities: No clubbing or cyanosis.  HEENT: Normal.   Stop milrinone today and stop IV Lasix.  Will start torsemide 40 mg daily tomorrow.   Stop IV amiodarone, start amiodarone 200 mg bid.  Apixaban increased to 5 mg bid yesterday (creatinine now < 1.5).  Will plan on re-attempt TEE-DCCV in 1 month after more time with anticoagulation.   Still with foley in place.  Will need voiding trial, possibly tomorrow.   Hopefully home tomorrow if remains stable on milrinone with close followup.   Loralie Champagne 08/08/2019 8:26 AM

## 2019-08-09 LAB — CBC WITH DIFFERENTIAL/PLATELET
Abs Immature Granulocytes: 0.02 10*3/uL (ref 0.00–0.07)
Basophils Absolute: 0 10*3/uL (ref 0.0–0.1)
Basophils Relative: 1 %
Eosinophils Absolute: 0.4 10*3/uL (ref 0.0–0.5)
Eosinophils Relative: 6 %
HCT: 46.9 % (ref 39.0–52.0)
Hemoglobin: 15.3 g/dL (ref 13.0–17.0)
Immature Granulocytes: 0 %
Lymphocytes Relative: 25 %
Lymphs Abs: 1.7 10*3/uL (ref 0.7–4.0)
MCH: 29.3 pg (ref 26.0–34.0)
MCHC: 32.6 g/dL (ref 30.0–36.0)
MCV: 89.7 fL (ref 80.0–100.0)
Monocytes Absolute: 0.9 10*3/uL (ref 0.1–1.0)
Monocytes Relative: 13 %
Neutro Abs: 3.8 10*3/uL (ref 1.7–7.7)
Neutrophils Relative %: 55 %
Platelets: 186 10*3/uL (ref 150–400)
RBC: 5.23 MIL/uL (ref 4.22–5.81)
RDW: 14.4 % (ref 11.5–15.5)
WBC: 6.8 10*3/uL (ref 4.0–10.5)
nRBC: 0 % (ref 0.0–0.2)

## 2019-08-09 LAB — BASIC METABOLIC PANEL
Anion gap: 10 (ref 5–15)
BUN: 16 mg/dL (ref 8–23)
CO2: 32 mmol/L (ref 22–32)
Calcium: 9.6 mg/dL (ref 8.9–10.3)
Chloride: 94 mmol/L — ABNORMAL LOW (ref 98–111)
Creatinine, Ser: 1.32 mg/dL — ABNORMAL HIGH (ref 0.61–1.24)
GFR calc Af Amer: 59 mL/min — ABNORMAL LOW (ref >60)
GFR calc non Af Amer: 51 mL/min — ABNORMAL LOW (ref >60)
Glucose, Bld: 111 mg/dL — ABNORMAL HIGH (ref 70–99)
Potassium: 4 mmol/L (ref 3.5–5.1)
Sodium: 136 mmol/L (ref 135–145)

## 2019-08-09 LAB — COOXEMETRY PANEL
Carboxyhemoglobin: 1.1 % (ref 0.5–1.5)
Carboxyhemoglobin: 1.6 % — ABNORMAL HIGH (ref 0.5–1.5)
Methemoglobin: 0.7 % (ref 0.0–1.5)
Methemoglobin: 0.9 % (ref 0.0–1.5)
O2 Saturation: 49.9 %
O2 Saturation: 57.5 %
Total hemoglobin: 15.8 g/dL (ref 12.0–16.0)
Total hemoglobin: 16.1 g/dL — ABNORMAL HIGH (ref 12.0–16.0)

## 2019-08-09 MED ORDER — TORSEMIDE 20 MG PO TABS: 40.0000 mg | ORAL_TABLET | Freq: Every day | ORAL | 5 refills | Status: DC

## 2019-08-09 MED ORDER — AMIODARONE HCL 200 MG PO TABS: ORAL_TABLET | ORAL | 3 refills | Status: DC

## 2019-08-09 MED ORDER — DIGOXIN 125 MCG PO TABS: 0.1250 mg | ORAL_TABLET | Freq: Every day | ORAL | 3 refills | Status: DC

## 2019-08-09 MED ORDER — SPIRONOLACTONE 25 MG PO TABS
25.0000 mg | ORAL_TABLET | Freq: Every day | ORAL | Status: DC
  Administered 2019-08-09: 12:00:00 25 mg via ORAL
  Filled 2019-08-09: qty 1

## 2019-08-09 MED ORDER — SPIRONOLACTONE 25 MG PO TABS: 25.0000 mg | ORAL_TABLET | Freq: Every day | ORAL | 5 refills | Status: DC

## 2019-08-09 MED ORDER — SACUBITRIL-VALSARTAN 24-26 MG PO TABS: 1.0000 | ORAL_TABLET | Freq: Two times a day (BID) | ORAL | 5 refills | Status: DC

## 2019-08-09 MED ORDER — APIXABAN 5 MG PO TABS: 5.0000 mg | ORAL_TABLET | Freq: Two times a day (BID) | ORAL | 5 refills | Status: DC

## 2019-08-09 MED FILL — SPIRONOLACTONE 25 MG TABLET: 25 | 30 days supply | Qty: 30 | Fill #0

## 2019-08-09 MED FILL — AMIODARONE HCL 200 MG TAB: 200 | 30 days supply | Qty: 45 | Fill #0

## 2019-08-09 MED FILL — DIGOXIN 0.125 MG TABLET: 125 | 30 days supply | Qty: 30 | Fill #0

## 2019-08-09 MED FILL — ENTRESTO 24 MG-26 MG TABLET: 24-26 | 30 days supply | Qty: 60 | Fill #0

## 2019-08-09 MED FILL — TORSEMIDE 20 MG TABLET: 20 | 30 days supply | Qty: 60 | Fill #0

## 2019-08-09 MED FILL — ELIQUIS 5 MG TABLET: 5 | 30 days supply | Qty: 60 | Fill #0

## 2019-08-09 NOTE — Progress Notes (Signed)
Right arm DL PICC removed per protocol for pt's discharge. Manual pressure applied for 5 mins. No bleeding or swelling noted. Instructed patient to remain in bed until 1430. Educated patient about S/S of infection and when to call MD; no heavy lifting or pressure on right side for 24 hours; keep dressing dry and intact until 1400 tomorrow. Pt verbalized comprehension.

## 2019-08-09 NOTE — TOC Progression Note (Addendum)
Transition of Care [facility]) - Progression Note    Patient Details  Name: Chris Guerrero MRN: [id number] Date of Birth: [birth year]  Transition [facility]) CM/SW Contact  Zenon Mayo, RN Phone Number: 08/09/2019, 12:24 PM  Clinical Narrative:    Patient is for dc today, NCM spoke with Clair Gulling Staff RN he will let patient know he has a zero dollar copay for both entresto and eliquis.  TOC is to fill the first 30 day free also. Per previous NCM note, patient does not want [facility] services.   Expected Discharge Plan: Home/Self Care Barriers to Discharge: No Barriers Identified  Expected Discharge Plan and Services Expected Discharge Plan: Home/Self Care In-house Referral: Nutrition     Living arrangements for the past 2 months: Single Family Home Expected Discharge Date: 08/09/19                         [facility] Arranged: (patient is not home bound and is thinking about if he needs home health services)           Social Determinants of Health (SDOH) Interventions    Readmission Risk Interventions No flowsheet data found.

## 2019-08-09 NOTE — Progress Notes (Signed)
CARDIAC REHAB PHASE I   PRE:  Rate/Rhythm: 81 afib  BP:  Supine: 90/68  Sitting: 92/75  Standing:    SaO2: 93%RA  MODE:  Ambulation: 250 ft   POST:  Rate/Rhythm: 112 afib PVCs  BP:  Supine:   Sitting: 109/80  Standing:    SaO2: 96%RA 1126-1150 Pt did not want to use walker so we walked as asst x 2. One asst to manage equipment and one asst to walk. Pt a little wobbly. We encouraged pt to keep feet apart. Discussed with pt that a rolling walker for home is recommended by PT and [place] to help with stability. Pt stated whatever we thought was best for him. Also think pt would benefit from home PT. Pt to recliner. Encouraged low sodium and daily weights. Encouraged pt to have someone with him until stronger.   Graylon Good, RN BSN  08/09/2019 11:48 AM

## 2019-08-09 NOTE — Progress Notes (Signed)
Patient ID: CLAUDIUS MICH, male   DOB: Feb 22, 1941, 79 y.o.   MRN: [id number]     Advanced Heart Failure Rounding Note  PCP-Cardiologist: Kate Sable, MD   Subjective:    Remains in a fib rate 80s-90s. He is off milrinone.  CVP not set up.  Co-ox lower today at 50%.   No dyspnea, feels good.  Wants to go home.   2/22/ S/P TEE- LA appendage clot noted. No cardioversion.    RHC/LHC:  Coronary Findings  Diagnostic Dominance: Right Left Main  No significant coronary disease.  Left Anterior Descending  No significant coronary disease.  Left Circumflex  No significant coronary disease.  Right Coronary Artery  No significant coronary disease.  Intervention  No interventions have been documented. Right Heart  Right Heart Pressures RHC Procedural Findings (milrinone 0.375): Hemodynamics (mmHg) RA mean 8 RV 38/8 PA 41/14, mean 29 PCWP mean 19 LV 76/14 AO 81/42  Oxygen saturations: PA 75% AO 98%  Cardiac Output (Fick) 6.44  Cardiac Index (Fick) 3.02 PVR 1.6 WU  30 cc contrast      Objective:   Weight Range: 87.3 kg Body mass index is 28.42 kg/m.   Vital Signs:   Temp:  [97.6 F (36.4 C)-98.3 F (36.8 C)] 97.6 F (36.4 C) (02/24 0720) Pulse Rate:  [57-96] 77 (02/24 0720) Resp:  [12-18] 15 (02/24 0720) BP: (88-109)/(62-82) 109/82 (02/24 0720) SpO2:  [92 %-97 %] 93 % (02/24 0720) Weight:  [87.3 kg] 87.3 kg (02/24 0427) Last BM Date: 08/07/19  Weight change: Filed Weights   08/07/19 0332 08/08/19 0417 08/09/19 0427  Weight: 90.9 kg 85.8 kg 87.3 kg    Intake/Output:   Intake/Output Summary (Last 24 hours) at 08/09/2019 0843 Last data filed at 08/09/2019 0434 Gross per 24 hour  Intake 840 ml  Output 2900 ml  Net -2060 ml      Physical Exam   General: NAD Neck: No JVD, no thyromegaly or thyroid nodule.  Lungs: Clear to auscultation bilaterally with normal respiratory effort. CV: Nondisplaced PMI.  Heart regular S1/S2, no S3/S4, no murmur.   1+ ankle.   Abdomen: Soft, nontender, no hepatosplenomegaly, no distention.  Skin: Intact without lesions or rashes.  Neurologic: Alert and oriented x 3.  Psych: Normal affect. Extremities: No clubbing or cyanosis.  HEENT: Normal.   Telemetry    A fib 80s-90s  Labs    CBC Recent Labs    08/08/19 0228 08/09/19 0444  WBC 7.2 6.8  NEUTROABS 4.2 3.8  HGB 14.4 15.3  HCT 43.2 46.9  MCV 89.1 89.7  PLT 178 631   Basic Metabolic Panel Recent Labs    08/07/19 0342 08/07/19 0342 08/08/19 0228 08/09/19 0444  NA 136   < > 135 136  K 3.9   < > 3.9 4.0  CL 93*   < > 92* 94*  CO2 30   < > 30 32  GLUCOSE 122*   < > 111* 111*  BUN 15   < > 15 16  CREATININE 1.39*   < > 1.35* 1.32*  CALCIUM 8.9   < > 9.3 9.6  MG 2.0  --   --   --    < > = values in this interval not displayed.   Liver Function Tests No results for input(s): AST, ALT, ALKPHOS, BILITOT, PROT, ALBUMIN in the last 72 hours. No results for input(s): LIPASE, AMYLASE in the last 72 hours. Cardiac Enzymes No results for input(s): CKTOTAL, CKMB, CKMBINDEX, TROPONINI  in the last 72 hours.  BNP: BNP (last 3 results) Recent Labs    08/01/19 1721  BNP 1,068.0*    ProBNP (last 3 results) No results for input(s): PROBNP in the last 8760 hours.   D-Dimer No results for input(s): DDIMER in the last 72 hours. Hemoglobin A1C No results for input(s): HGBA1C in the last 72 hours. Fasting Lipid Panel No results for input(s): CHOL, HDL, LDLCALC, TRIG, CHOLHDL, LDLDIRECT in the last 72 hours. Thyroid Function Tests No results for input(s): TSH, T4TOTAL, T3FREE, THYROIDAB in the last 72 hours.  Invalid input(s): FREET3  Other results:   Imaging    No results found.   Medications:     Scheduled Medications: . amiodarone  200 mg Oral BID  . apixaban  5 mg Oral BID  . atorvastatin  10 mg Oral QPM  . Chlorhexidine Gluconate Cloth  6 each Topical Daily  . digoxin  0.125 mg Oral Daily  . sacubitril-valsartan   1 tablet Oral BID  . sodium chloride flush  10-40 mL Intracatheter Q12H  . sodium chloride flush  3 mL Intravenous Q12H  . spironolactone  25 mg Oral Daily  . torsemide  40 mg Oral Daily  . traZODone  50 mg Oral QHS    Infusions: . sodium chloride Stopped (08/07/19 1313)  . sodium chloride 10 mL/hr at 08/03/19 0437    PRN Medications: sodium chloride, acetaminophen, acetaminophen, ondansetron (ZOFRAN) IV, phenol, sodium chloride flush, sodium chloride flush    Assessment/Plan   1. Acute systolic CHF: Echo this admission with EF <20%, severely decreased RV function. Nonischemic cardiomyopathy, ?tachycardia-mediated.  No prior echo or history of CHF.  He has been symptomatic for about 1 month with edema and exertional dyspnea.  He was in atrial fibrillation with RVR at presentation.  Milrinone started with low co-ox.  RHC/LHC 2/19 with no coronary disease, mildly elevated filling pressures.  He is now off milrinone, co-ox lower at 50% today but feels good/no complaints.  Not volume overloaded on exam, CVP not set up. Remains in atrial fibrillation. Creatinine stable 1.32.  - Continue torsemide 40 mg daily, good UOP yesterday.  - Leave off milrinone, will resend co-ox.  - Continue digoxin 0.125 daily.  - Can increase spironolactone to 25 mg daily.  - Continue Entresto 24-26 mg twice a day.   - Consider cardiac MRI when back in NSR (after future DCCV).  2. Atrial fibrillation: Admitted with afib/RVR of uncertain duration.  Unsure if atrial fibrillation/RVR was cause of CMP (tachy-mediated) or an effect of the cardiomyopathy.  Regardless, he will need restoration of NSR. TEE on 2/22 showed LA appendage thrombus.  - He will need 1 month therapeutic anticoagulation, then repeat TEE with DCCV if clot cleared.  - Continue po amiodarone.  - Continue apixaban.    3. AKI on ?CKD stage 3: Initial creatinine 1.5, up to 1.9 initially and now 1.3. Suspect cardiorenal syndrome.  4. Hematuria:  Difficult foley insertion with BPH on 2/19.   Urine has cleared. Will ask urology if we should leave in and send with foley or try to remove.  He has been followed by Dr Adah Salvage in the community.  5. Disposition: He can go home today.  Followup within 2 wks in CHF clinic.  Will need home health/PT.  Will need followup with urology.  Meds for discharge: torsemide 40 mg daily, amiodarone 200 mg bid x 1 week then 200 mg daily, spironolactone 25 daily, digoxin 0.125 daily, Entresto 24/26 bid,  apixaban 5 bid. BMET at followup.   Length of Stay: [place], MD  08/09/2019, 8:43 AM  Advanced Heart Failure Team Pager [phone] (M-F; 7a - 4p)  Please contact [facility] for night-coverage after hours (4p -7a ) and weekends on [email or web address]

## 2019-08-09 NOTE — Discharge Summary (Signed)
Advanced Heart Failure Team  Discharge Summary   Patient ID: KARSTEN HOWRY MRN: [id number], DOB/AGE: Feb 25, 1941 79 y.o. Admit date: 08/01/2019 D/C date:     08/09/2019   Primary Discharge Diagnoses:  Acute Systolic Heart Failure w/ Low Output Persistent Atrial Fibrillation Left Atrial Appendage Thrombus Chronic Anticoagulation Therapy (Eliquis) AKI on CKD, Stage 3 BOO/Urinary Retention requiring indwelling foley catheter   HPI/Hospital Course:  79 y/o male w/ prior history of HTN and hyperlipidemia, who initially reported to [facility]  w/ complaints of  about 1 month of cough and dyspnea with exertion as well as progressive edema. No chest pain. He saw his PCP prior to coming to the ED on 2/16 and was noted to be in atrial fibrillation with RVR and he was then sent to [facility] for admission. Hs-TnI was flat at 25 =>27. BNP elevated, 1,068. COVID negative.He was initially tried on diltiazem gtt but became hypotensive and changed to amiodarone gtt. Echo was done, showing EF <20% with diffuse hypokinesis, severely decreased RV function. He was transferred to [facility] on 2/18 for HF work up and cath.   On arrival to [facility], he was found to be volume overloaded w/ soft SBPs and rising SCr up to 1.9 with attempts at diuresis, concerning for low output. PICC line was placed to measure co-ox and CVP. Initial co-ox low at 46%. He was started on milrinone and IV Lasix. IV amiodarone continued for afib + IV heparin. RHC/LHC 2/19 with no coronary disease, mildly elevated filling pressures. Co-ox improved w/ milrinone. He was ultimately weaned off milrinone and co-ox remained stable. He diuresed well w/ IV Lasix and later changed to torsemide. SCr improved w/ diuresis, down to 1.32.   He was set up for TEE/DDCV on 2/22 and found to have LAA thrombus on TEE. DCCV canceled. IV amiodarone was discontinued and he was changed to PO, 200 mg bid. Placed on Eliquis 5 bid. ASA discontinued. Plan to to repeat TEE/DCCV in 1  month of chronic anticoagulation. Plan to continue amiodarone 200 mg bid x 7 days, followed by 200 mg daily.   Also of note, he develop BOO/ urinary retention due to BPH. Had over 500cc on bladder scan. Multiple attempts were made by the nursing staff to place a foley which were unsuccessful. Urology was consulted and placed 16 french council foley catheter. Subsequently, he had mild hematuria which ultimately cleared. Per urology, foley to continue on discharge. Close clinic f/u has been arranged at [facility] on 3/2.  On 2/24, pt was seen and examined by Dr. Aundra Dubin and felt stable for discharge home. [facility] f/u arranged for 3/4. Home Health PT arranged.   Meds for discharge: torsemide 40 mg daily, amiodarone 200 mg bid x 1 week then 200 mg daily, spironolactone 25 daily, digoxin 0.125 daily, Entresto 24/26 bid, apixaban 5 bid. BMET at followup.     Discharge Weight Range: 192 lb  Discharge Vitals: Blood pressure 109/80, pulse 96, temperature 97.6 F (36.4 C), temperature source Oral, resp. rate 14, height 5\' 9"  (1.753 m), weight 87.3 kg, SpO2 93 %.  Labs: Lab Results  Component Value Date   WBC 6.8 08/09/2019   HGB 15.3 08/09/2019   HCT 46.9 08/09/2019   MCV 89.7 08/09/2019   PLT 186 08/09/2019    Recent Labs  Lab 08/09/19 0444  NA 136  K 4.0  CL 94*  CO2 32  BUN 16  CREATININE 1.32*  CALCIUM 9.6  GLUCOSE 111*   No results found  for: CHOL, HDL, LDLCALC, TRIG BNP (last 3 results) Recent Labs    08/01/19 1721  BNP 1,068.0*    ProBNP (last 3 results) No results for input(s): PROBNP in the last 8760 hours.   Diagnostic Studies/Procedures   2D Echo 08/02/19 1. Left ventricular ejection fraction, by estimation, is <20%. The left ventricle has severely decreased function. The left ventricle demonstrates global hypokinesis. There is mild concentric left ventricular hypertrophy. Left ventricular diastolic parameters are indeterminate. 2. Right ventricular  systolic function is severely reduced. The right ventricular size is mildly enlarged. There is mildly elevated pulmonary artery systolic pressure. 3. The mitral valve is grossly normal. Mild mitral valve regurgitation. 4. The aortic valve was not well visualized. Aortic valve regurgitation is not visualized. No aortic stenosis is present. 5. The inferior vena cava is normal in size with <50% respiratory variability, suggesting right atrial pressure of 8 mmHg.  RIGHT/LEFT HEART CATH AND CORONARY ANGIOGRAPHY 08/04/19  Conclusion  1. Mildly elevated filling pressures.   2. Excellent cardiac output on milrinone.  3. No significant coronary disease.   Nonischemic cardiomyopathy.    TEE 08/07/19 Findings: Please see echo section for full report.  Mildly dilated LV with EF 15-20%, diffuse hypokinesis.  Mild LV hypertrophy.  Mildly dilated RV with moderately decreased systolic function.  Moderate left atrial enlargement.  There is a large left atrial appendage thrombus noted.  Mild right atrial enlargement.  Very small PFO noted by color doppler.  Trivial TR, peak RV-RA gradient 15 mmHg.  Mild mitral regurgitation.  Trileaflet aortic valve with no stenosis or regurgitation.  Trivial pericardial effusion.  Small pleural effusion noted.  Normal caliber thoracic aorta with grade 3 plaque.   Impression: LA appendage thrombus noted, no DCCV (will wait 1 month on full anticoagulation and re-look).   Discharge Medications   Allergies as of 08/09/2019   No Known Allergies     Medication List    STOP taking these medications   aspirin 81 MG tablet   doxazosin 4 MG tablet Commonly known as: CARDURA   ibuprofen 200 MG tablet Commonly known as: ADVIL   losartan 50 MG tablet Commonly known as: COZAAR     TAKE these medications   amiodarone 200 MG tablet Commonly known as: PACERONE Take 1 tablet 2 times daily for 7 days, then reduce to 1 tablet once daily   apixaban 5 MG Tabs  tablet Commonly known as: ELIQUIS Take 1 tablet (5 mg total) by mouth 2 (two) times daily.   atorvastatin 10 MG tablet Commonly known as: LIPITOR Take 10 mg by mouth every evening.   digoxin 0.125 MG tablet Commonly known as: LANOXIN Take 1 tablet (0.125 mg total) by mouth daily.   multivitamin tablet Take 1 tablet by mouth daily.   sacubitril-valsartan 24-26 MG Commonly known as: ENTRESTO Take 1 tablet by mouth 2 (two) times daily.   spironolactone 25 MG tablet Commonly known as: ALDACTONE Take 1 tablet (25 mg total) by mouth daily.   torsemide 20 MG tablet Commonly known as: DEMADEX Take 2 tablets (40 mg total) by mouth daily.   traZODone 50 MG tablet Commonly known as: DESYREL Take 50 mg by mouth at bedtime.            Durable Medical Equipment  (From admission, onward)         Start     Ordered   08/09/19 0000  For home use only DME 4 wheeled rolling walker with seat    Question:  Patient needs a walker to treat with the following condition  Answer:  Chronic systolic heart failure ([place])   08/09/19 1200          Disposition   The patient will be discharged in stable condition to home. Discharge Instructions    Face-to-face encounter (required for Medicare/Medicaid patients)   Complete by: As directed    I Lyda Jester certify that this patient is under my care and that I, or a nurse practitioner or physician's assistant working with me, had a face-to-face encounter that meets the physician face-to-face encounter requirements with this patient on 08/08/2019. The encounter with the patient was in whole, or in part for the following medical condition(s) which is the primary reason for home health care (List medical condition): systolic heart failure   The encounter with the patient was in whole, or in part, for the following medical condition, which is the primary reason for home health care: systolic heart failure   I certify that, based on my findings,  the following services are medically necessary home health services: Physical therapy   Reason for Medically Necessary Home Health Services: Skilled Nursing- Change/Decline in Patient Status   My clinical findings support the need for the above services: Shortness of breath with activity   Further, I certify that my clinical findings support that this patient is homebound due to: Shortness of Breath with activity   For home use only DME 4 wheeled rolling walker with seat   Complete by: As directed    Patient needs a walker to treat with the following condition: Chronic systolic heart failure ([place])   Home Health   Complete by: As directed    To provide the following care/treatments: PT     Follow-up Information    [facility] Follow up on 08/17/2019.   Specialty: Cardiology Why: at 2:30 in the [facility] (Dr. Claris Gladden office) [place] [phone] Contact information: [address] [id number] [place] Follow up on 08/15/2019.   Why: 9:00 AM  Contact information: [place] [phone]            Duration of Discharge Encounter: Greater than 35 minutes   Signed, Nelida Gores 08/09/2019, 3:23 PM

## 2019-08-13 ENCOUNTER — Other Ambulatory Visit: Payer: Self-pay

## 2019-08-13 ENCOUNTER — Encounter (HOSPITAL_COMMUNITY): Payer: Self-pay | Admitting: Emergency Medicine

## 2019-08-13 ENCOUNTER — Emergency Department (HOSPITAL_COMMUNITY)
Admission: EM | Admit: 2019-08-13 | Discharge: 2019-08-13 | Disposition: A | Discharge status: Home / Self Care | Payer: No Typology Code available for payment source | Attending: Emergency Medicine | Admitting: Emergency Medicine

## 2019-08-13 DIAGNOSIS — Z79899 Other long term (current) drug therapy: Secondary | ICD-10-CM | POA: Diagnosis not present

## 2019-08-13 DIAGNOSIS — R319 Hematuria, unspecified: Secondary | ICD-10-CM | POA: Insufficient documentation

## 2019-08-13 DIAGNOSIS — I509 Heart failure, unspecified: Secondary | ICD-10-CM | POA: Insufficient documentation

## 2019-08-13 DIAGNOSIS — F17220 Nicotine dependence, chewing tobacco, uncomplicated: Secondary | ICD-10-CM | POA: Diagnosis not present

## 2019-08-13 DIAGNOSIS — I110 Hypertensive heart disease with heart failure: Secondary | ICD-10-CM | POA: Diagnosis not present

## 2019-08-13 DIAGNOSIS — R109 Unspecified abdominal pain: Secondary | ICD-10-CM | POA: Diagnosis not present

## 2019-08-13 LAB — CBC WITH DIFFERENTIAL/PLATELET
Abs Immature Granulocytes: 0.03 10*3/uL (ref 0.00–0.07)
Basophils Absolute: 0.1 10*3/uL (ref 0.0–0.1)
Basophils Relative: 1 %
Eosinophils Absolute: 0.2 10*3/uL (ref 0.0–0.5)
Eosinophils Relative: 3 %
HCT: 49 % (ref 39.0–52.0)
Hemoglobin: 16 g/dL (ref 13.0–17.0)
Immature Granulocytes: 0 %
Lymphocytes Relative: 21 %
Lymphs Abs: 1.7 10*3/uL (ref 0.7–4.0)
MCH: 29.5 pg (ref 26.0–34.0)
MCHC: 32.7 g/dL (ref 30.0–36.0)
MCV: 90.4 fL (ref 80.0–100.0)
Monocytes Absolute: 0.9 10*3/uL (ref 0.1–1.0)
Monocytes Relative: 12 %
Neutro Abs: 5.1 10*3/uL (ref 1.7–7.7)
Neutrophils Relative %: 63 %
Platelets: 271 10*3/uL (ref 150–400)
RBC: 5.42 MIL/uL (ref 4.22–5.81)
RDW: 14.7 % (ref 11.5–15.5)
WBC: 8.1 10*3/uL (ref 4.0–10.5)
nRBC: 0 % (ref 0.0–0.2)

## 2019-08-13 LAB — COMPREHENSIVE METABOLIC PANEL
ALT: 47 U/L — ABNORMAL HIGH (ref 0–44)
AST: 49 U/L — ABNORMAL HIGH (ref 15–41)
Albumin: 4.1 g/dL (ref 3.5–5.0)
Alkaline Phosphatase: 122 U/L (ref 38–126)
Anion gap: 12 (ref 5–15)
BUN: 24 mg/dL — ABNORMAL HIGH (ref 8–23)
CO2: 28 mmol/L (ref 22–32)
Calcium: 9 mg/dL (ref 8.9–10.3)
Chloride: 89 mmol/L — ABNORMAL LOW (ref 98–111)
Creatinine, Ser: 1.57 mg/dL — ABNORMAL HIGH (ref 0.61–1.24)
GFR calc Af Amer: 48 mL/min — ABNORMAL LOW (ref >60)
GFR calc non Af Amer: 41 mL/min — ABNORMAL LOW (ref >60)
Glucose, Bld: 143 mg/dL — ABNORMAL HIGH (ref 70–99)
Potassium: 4 mmol/L (ref 3.5–5.1)
Sodium: 129 mmol/L — ABNORMAL LOW (ref 135–145)
Total Bilirubin: 1.4 mg/dL — ABNORMAL HIGH (ref 0.3–1.2)
Total Protein: 7.5 g/dL (ref 6.5–8.1)

## 2019-08-13 LAB — PROTIME-INR
INR: 1.4 — ABNORMAL HIGH (ref 0.8–1.2)
Prothrombin Time: 17.1 seconds — ABNORMAL HIGH (ref 11.4–15.2)

## 2019-08-13 LAB — APTT: aPTT: 31 seconds (ref 24–36)

## 2019-08-13 MED ORDER — LIDOCAINE HCL URETHRAL/MUCOSAL 2 % EX GEL
CUTANEOUS | Status: AC
  Administered 2019-08-13: 1
  Filled 2019-08-13: qty 10

## 2019-08-13 MED ORDER — SODIUM CHLORIDE 0.9 % IV BOLUS
1000.0000 mL | Freq: Once | INTRAVENOUS | Status: AC
  Administered 2019-08-13: 1000 mL via INTRAVENOUS

## 2019-08-13 NOTE — ED Notes (Signed)
Catheter removed without difficulty. Large clots came with catheter and were expelled from penis after removal. No further bleeding at this time. Pt instructed to let me know when he had to urinate.

## 2019-08-13 NOTE — ED Provider Notes (Addendum)
[facility] EMERGENCY DEPARTMENT Provider Note   CSN: [id number] Arrival date & time: 08/13/19  3149     History Chief Complaint  Patient presents with  . Hematuria    Chris Guerrero is a 79 y.o. male.  Pt reports he had atrial fib and had a cardiac cath.  After the procedure he could not urinate and had to have a foley placed.  Pt reports the urologist had to place it.  Pt complains of bleeding around the catheter now.  Pt complains of feeling full and pain  The history is provided by the patient. No language interpreter was used.  Hematuria This is a new problem. The current episode started 12 to 24 hours ago. The problem occurs constantly. Associated symptoms include abdominal pain. Nothing aggravates the symptoms. Nothing relieves the symptoms. He has tried nothing for the symptoms. The treatment provided no relief.       Past Medical History:  Diagnosis Date  . Hyperlipemia   . Hypertension     Patient Active Problem List   Diagnosis Date Noted  . Acute pulmonary edema (HCC)   . CHF (congestive heart failure) ([place]) 08/01/2019  . Atrial fibrillation with RVR ([place]) 08/01/2019  . Peripheral edema 08/01/2019  . Prolonged QT interval 08/01/2019  . Elevated brain natriuretic peptide (BNP) level 08/01/2019  . Essential hypertension 08/01/2019  . Hyperlipidemia 08/01/2019  . Elevated serum creatinine 08/01/2019  . History of colonic polyps   . Diverticulosis of colon without hemorrhage   . History of adenomatous polyp of colon 10/03/2015    Past Surgical History:  Procedure Laterality Date  . CHOLECYSTECTOMY    . COLONOSCOPY  09/04/2010   RMR: diverticula, cecal polyp = tubular adenoma; Repeat in 5 years  . COLONOSCOPY N/A 10/10/2015   Procedure: COLONOSCOPY;  Surgeon: Daneil Dolin, MD;  [facility];  Service: Endoscopy;  Laterality: N/A;  1300  . HERNIA REPAIR    . RIGHT/LEFT HEART CATH AND CORONARY ANGIOGRAPHY N/A 08/04/2019   Procedure: RIGHT/LEFT  HEART CATH AND CORONARY ANGIOGRAPHY;  Surgeon: Larey Dresser, MD;  Location: [facility];  Service: Cardiovascular;  Laterality: N/A;  . TEE WITHOUT CARDIOVERSION N/A 08/07/2019   Procedure: TRANSESOPHAGEAL ECHOCARDIOGRAM (TEE);  Surgeon: Larey Dresser, MD;  Location: [facility] ENDOSCOPY;  Service: Cardiovascular;  Laterality: N/A;       Family History  Problem Relation Age of Onset  . Colon cancer Neg Hx     Social History   Tobacco Use  . Smoking status: Never Smoker  . Smokeless tobacco: Current User    Types: Chew  . Tobacco comment: rarely chews tobacco  Substance Use Topics  . Alcohol use: Not Currently    Alcohol/week: 0.0 standard drinks    Comment: pt says drank "pretty good" up until 2 or 3 months ago.  . Drug use: No    Home Medications Prior to Admission medications   Medication Sig Start Date End Date Taking? Authorizing Provider  amiodarone (PACERONE) 200 MG tablet Take 1 tablet 2 times daily for 7 days, then reduce to 1 tablet once daily 08/09/19  Yes Rosita Fire, Brittainy M, [name]  apixaban (ELIQUIS) 5 MG TABS tablet Take 1 tablet (5 mg total) by mouth 2 (two) times daily. 08/09/19  Yes Lyda Jester M, [name]  atorvastatin (LIPITOR) 10 MG tablet Take 10 mg by mouth every evening.    Yes [provider]  digoxin (LANOXIN) 0.125 MG tablet Take 1 tablet (0.125 mg total)  by mouth daily. 08/09/19  Yes Lyda Jester M, [name]  Multiple Vitamin (MULTIVITAMIN) tablet Take 1 tablet by mouth daily.   Yes [provider]  sacubitril-valsartan (ENTRESTO) 24-26 MG Take 1 tablet by mouth 2 (two) times daily. 08/09/19  Yes Lyda Jester M, [name]  spironolactone (ALDACTONE) 25 MG tablet Take 1 tablet (25 mg total) by mouth daily. 08/09/19  Yes Rosita Fire, Brittainy M, [name]  torsemide (DEMADEX) 20 MG tablet Take 2 tablets (40 mg total) by mouth daily. 08/09/19  Yes Lyda Jester M, [name]  traZODone (DESYREL) 50 MG tablet Take 50 mg by mouth at bedtime.    Yes [provider]    Allergies    Patient has no known allergies.  Review of Systems   Review of Systems  Gastrointestinal: Positive for abdominal pain.  Genitourinary: Positive for hematuria.  All other systems reviewed and are negative.   Physical Exam Updated Vital Signs BP 120/78   Pulse (!) 34   Temp 97.8 F (36.6 C) (Oral)   Resp (!) 23   Ht 5\' 9"  (1.753 m)   Wt 82.1 kg   SpO2 100%   BMI 26.73 kg/m   Physical Exam Vitals and nursing note reviewed.  Constitutional:      Appearance: He is well-developed.  HENT:     Head: Normocephalic and atraumatic.  Eyes:     Conjunctiva/sclera: Conjunctivae normal.  Cardiovascular:     Rate and Rhythm: Normal rate and regular rhythm.     Heart sounds: No murmur.  Pulmonary:     Effort: Pulmonary effort is normal. No respiratory distress.     Breath sounds: Normal breath sounds.  Abdominal:     Palpations: Abdomen is soft.     Tenderness: There is no abdominal tenderness.  Musculoskeletal:     Cervical back: Neck supple.  Skin:    General: Skin is warm and dry.  Neurological:     General: No focal deficit present.     Mental Status: He is alert.  Psychiatric:        Mood and Affect: Mood normal.     ED Results / Procedures / Treatments   Labs (all labs ordered are listed, but only abnormal results are displayed) Labs Reviewed  COMPREHENSIVE METABOLIC PANEL - Abnormal; Notable for the following components:      Result Value   Sodium 129 (*)    Chloride 89 (*)    Glucose, Bld 143 (*)    BUN 24 (*)    Creatinine, Ser 1.57 (*)    AST 49 (*)    ALT 47 (*)    Total Bilirubin 1.4 (*)    GFR calc non Af Amer 41 (*)    GFR calc Af Amer 48 (*)    All other components within normal limits  PROTIME-INR - Abnormal; Notable for the following components:   Prothrombin Time 17.1 (*)    INR 1.4 (*)    All other components within normal limits  URINE CULTURE  CBC WITH DIFFERENTIAL/PLATELET  APTT     EKG None  Radiology No results found.  Procedures Procedures (including critical care time)  Medications Ordered in ED Medications  sodium chloride 0.9 % bolus 1,000 mL (1,000 mLs Intravenous New Bag/Given 08/13/19 0953)  lidocaine (XYLOCAINE) 2 % jelly (1 application  Given 7/67/34 1120)    ED Course  I have reviewed the triage vital signs and the nursing notes.  Pertinent labs & imaging results that were available during my care  of the patient were reviewed by me and considered in my medical decision making (see chart for details).    MDM Rules/Calculators/A&P                     MDM: Rn attempted irrigation of foley,  Pt continued to have bleeding around.  Foley removed,  Pt passed several large clots but was then unable to urinate.  Foley was replaced and is now draining.  Pt is suppose to follow up with Urology on Tuesday.  Final Clinical Impression(s) / ED Diagnoses Final diagnoses:  Hematuria, unspecified type    Rx / DC Orders ED Discharge Orders    None    An After Visit Summary was printed and given to the patient.    Fransico Meadow, [name] 08/13/19 1201    [address] 08/13/19 1207    [facility] Ferguson, MD 08/13/19 1208

## 2019-08-13 NOTE — ED Triage Notes (Signed)
Pt states he has been having some blood in his catheter bag since leaving the hospital but last night began bleeding from penis and into catheter bag. Slow ooze noted around foley cath from penis. Pt was recently placed on Eliquis.

## 2019-08-13 NOTE — ED Notes (Signed)
Pt still bleeding from penis. Some clots but mostly steady ooze.

## 2019-08-13 NOTE — Discharge Instructions (Addendum)
See the Urologist as scheduled.  Go to [facility] in [place] if any problems.

## 2019-08-14 LAB — URINE CULTURE: Culture: NO GROWTH

## 2019-08-15 DIAGNOSIS — R338 Other retention of urine: Secondary | ICD-10-CM | POA: Diagnosis not present

## 2019-08-16 ENCOUNTER — Encounter (HOSPITAL_COMMUNITY): Payer: Self-pay

## 2019-08-16 ENCOUNTER — Emergency Department (HOSPITAL_COMMUNITY)
Admission: EM | Admit: 2019-08-16 | Discharge: 2019-08-16 | Disposition: A | Discharge status: Home / Self Care | Payer: No Typology Code available for payment source | Attending: Emergency Medicine | Admitting: Emergency Medicine

## 2019-08-16 ENCOUNTER — Other Ambulatory Visit: Payer: Self-pay

## 2019-08-16 DIAGNOSIS — R339 Retention of urine, unspecified: Secondary | ICD-10-CM | POA: Diagnosis present

## 2019-08-16 DIAGNOSIS — R338 Other retention of urine: Secondary | ICD-10-CM | POA: Diagnosis not present

## 2019-08-16 DIAGNOSIS — Z79899 Other long term (current) drug therapy: Secondary | ICD-10-CM | POA: Insufficient documentation

## 2019-08-16 DIAGNOSIS — Z7901 Long term (current) use of anticoagulants: Secondary | ICD-10-CM | POA: Diagnosis not present

## 2019-08-16 DIAGNOSIS — I110 Hypertensive heart disease with heart failure: Secondary | ICD-10-CM | POA: Diagnosis not present

## 2019-08-16 DIAGNOSIS — F17290 Nicotine dependence, other tobacco product, uncomplicated: Secondary | ICD-10-CM | POA: Insufficient documentation

## 2019-08-16 DIAGNOSIS — I509 Heart failure, unspecified: Secondary | ICD-10-CM | POA: Diagnosis not present

## 2019-08-16 DIAGNOSIS — N400 Enlarged prostate without lower urinary tract symptoms: Secondary | ICD-10-CM | POA: Diagnosis not present

## 2019-08-16 DIAGNOSIS — Z466 Encounter for fitting and adjustment of urinary device: Secondary | ICD-10-CM | POA: Diagnosis not present

## 2019-08-16 LAB — URINALYSIS, ROUTINE W REFLEX MICROSCOPIC
Bacteria, UA: NONE SEEN
Bilirubin Urine: NEGATIVE
Glucose, UA: NEGATIVE mg/dL
Ketones, ur: NEGATIVE mg/dL
Leukocytes,Ua: NEGATIVE
Nitrite: NEGATIVE
Protein, ur: NEGATIVE mg/dL
Specific Gravity, Urine: 1.006 (ref 1.005–1.030)
pH: 7 (ref 5.0–8.0)

## 2019-08-16 NOTE — ED Provider Notes (Signed)
[facility] DEPT Provider Note   CSN: [id number] Arrival date & time: 08/16/19  1927     History Chief Complaint  Patient presents with  . Urinary Retention    Chris Guerrero is a 79 y.o. male.  79 year old male presents with urinary retention x12 hours.  Patient recently treated for same and had his catheter removed yesterday.  States he is on been able to to urinate small amounts.  Notes suprapubic pressure without fever or chills.  No emesis.  Denies any flank pain.  no hematuria noted        Past Medical History:  Diagnosis Date  . Hyperlipemia   . Hypertension     Patient Active Problem List   Diagnosis Date Noted  . Acute pulmonary edema (HCC)   . CHF (congestive heart failure) ([place]) 08/01/2019  . Atrial fibrillation with RVR ([place]) 08/01/2019  . Peripheral edema 08/01/2019  . Prolonged QT interval 08/01/2019  . Elevated brain natriuretic peptide (BNP) level 08/01/2019  . Essential hypertension 08/01/2019  . Hyperlipidemia 08/01/2019  . Elevated serum creatinine 08/01/2019  . History of colonic polyps   . Diverticulosis of colon without hemorrhage   . History of adenomatous polyp of colon 10/03/2015    Past Surgical History:  Procedure Laterality Date  . CHOLECYSTECTOMY    . COLONOSCOPY  09/04/2010   RMR: diverticula, cecal polyp = tubular adenoma; Repeat in 5 years  . COLONOSCOPY N/A 10/10/2015   Procedure: COLONOSCOPY;  Surgeon: Daneil Dolin, MD;  [facility];  Service: Endoscopy;  Laterality: N/A;  1300  . HERNIA REPAIR    . RIGHT/LEFT HEART CATH AND CORONARY ANGIOGRAPHY N/A 08/04/2019   Procedure: RIGHT/LEFT HEART CATH AND CORONARY ANGIOGRAPHY;  Surgeon: Larey Dresser, MD;  Location: [facility] LAB;  Service: Cardiovascular;  Laterality: N/A;  . TEE WITHOUT CARDIOVERSION N/A 08/07/2019   Procedure: TRANSESOPHAGEAL ECHOCARDIOGRAM (TEE);  Surgeon: Larey Dresser, MD;  [facility] ENDOSCOPY;  Service:  Cardiovascular;  Laterality: N/A;       Family History  Problem Relation Age of Onset  . Colon cancer Neg Hx     Social History   Tobacco Use  . Smoking status: Never Smoker  . Smokeless tobacco: Current User    Types: Chew  . Tobacco comment: rarely chews tobacco  Substance Use Topics  . Alcohol use: Not Currently    Alcohol/week: 0.0 standard drinks    Comment: pt says drank "pretty good" up until 2 or 3 months ago.  . Drug use: No    Home Medications Prior to Admission medications   Medication Sig Start Date End Date Taking? Authorizing Provider  amiodarone (PACERONE) 200 MG tablet Take 1 tablet 2 times daily for 7 days, then reduce to 1 tablet once daily 08/09/19   Lyda Jester M, [name]  apixaban (ELIQUIS) 5 MG TABS tablet Take 1 tablet (5 mg total) by mouth 2 (two) times daily. 08/09/19   Lyda Jester M, [name]  atorvastatin (LIPITOR) 10 MG tablet Take 10 mg by mouth every evening.     [provider]  digoxin (LANOXIN) 0.125 MG tablet Take 1 tablet (0.125 mg total) by mouth daily. 08/09/19   Consuelo Pandy, [name]  Multiple Vitamin (MULTIVITAMIN) tablet Take 1 tablet by mouth daily.    [provider]  sacubitril-valsartan (ENTRESTO) 24-26 MG Take 1 tablet by mouth 2 (two) times daily. 08/09/19   Lyda Jester M, [name]  spironolactone (ALDACTONE) 25 MG tablet Take  1 tablet (25 mg total) by mouth daily. 08/09/19   Lyda Jester M, [name]  torsemide (DEMADEX) 20 MG tablet Take 2 tablets (40 mg total) by mouth daily. 08/09/19   Lyda Jester M, [name]  traZODone (DESYREL) 50 MG tablet Take 50 mg by mouth at bedtime.    [provider]    Allergies    Patient has no known allergies.  Review of Systems   Review of Systems  All other systems reviewed and are negative.   Physical Exam Updated Vital Signs BP 121/76   Pulse 82   Temp 97.8 F (36.6 C) (Oral)   Resp 16   SpO2 96%   Physical Exam Vitals and nursing note  reviewed.  Constitutional:      General: He is not in acute distress.    Appearance: Normal appearance. He is well-developed. He is not toxic-appearing.  HENT:     Head: Normocephalic and atraumatic.  Eyes:     General: Lids are normal.     Conjunctiva/sclera: Conjunctivae normal.     Pupils: Pupils are equal, round, and reactive to light.  Neck:     Thyroid: No thyroid mass.     Trachea: No tracheal deviation.  Cardiovascular:     Rate and Rhythm: Normal rate and regular rhythm.     Heart sounds: Normal heart sounds. No murmur. No gallop.   Pulmonary:     Effort: Pulmonary effort is normal. No respiratory distress.     Breath sounds: Normal breath sounds. No stridor. No decreased breath sounds, wheezing, rhonchi or rales.  Abdominal:     General: Bowel sounds are normal. There is no distension.     Palpations: Abdomen is soft.     Tenderness: There is no abdominal tenderness. There is no rebound.  Musculoskeletal:        General: No tenderness. Normal range of motion.     Cervical back: Normal range of motion and neck supple.  Skin:    General: Skin is warm and dry.     Findings: No abrasion or rash.  Neurological:     Mental Status: He is alert and oriented to person, place, and time.     GCS: GCS eye subscore is 4. GCS verbal subscore is 5. GCS motor subscore is 6.     Cranial Nerves: No cranial nerve deficit.     Sensory: No sensory deficit.  Psychiatric:        Speech: Speech normal.        Behavior: Behavior normal.     ED Results / Procedures / Treatments   Labs (all labs ordered are listed, but only abnormal results are displayed) Labs Reviewed  URINE CULTURE  URINALYSIS, ROUTINE W REFLEX MICROSCOPIC    EKG None  Radiology No results found.  Procedures Procedures (including critical care time)  Medications Ordered in ED Medications - No data to display  ED Course  I have reviewed the triage vital signs and the nursing notes.  Pertinent labs &  imaging results that were available during my care of the patient were reviewed by me and considered in my medical decision making (see chart for details).    MDM Rules/Calculators/A&P                      Foley was attempted to be passed by nursing without success.  Dr. Alinda Money from urology came and saw the patient and place a catheter in the patient to follow-up with his  urologist Final Clinical Impression(s) / ED Diagnoses Final diagnoses:  None    Rx / DC Orders ED Discharge Orders    None       Lacretia Leigh, MD 08/16/19 2217

## 2019-08-16 NOTE — Discharge Instructions (Addendum)
Follow-up with your urologist this week. °

## 2019-08-16 NOTE — Consult Note (Signed)
Urology Consult   Physician requesting consult: Dr. Zenia Resides  Reason for consult: Difficult urethral catheterization  History of Present Illness: Chris Guerrero is a 79 y.o. with a history of BPH who recently presented in urinary retention.  He was unable to have a catheter placed and due to catheter trauma, required urology involvement with placement of a catheter under cystoscopic guidance.  He was noted to have a false passage during cystoscopy.  His catheter was left indwelling and was removed after 2 weeks.  His catheter was actually taken out yesterday.  He was voiding well until this morning when he again developed urinary retention.  He presented to the emergency department.  He is currently in significant distress with suprapubic pain.    Past Medical History:  Diagnosis Date  . Hyperlipemia   . Hypertension     Past Surgical History:  Procedure Laterality Date  . CHOLECYSTECTOMY    . COLONOSCOPY  09/04/2010   RMR: diverticula, cecal polyp = tubular adenoma; Repeat in 5 years  . COLONOSCOPY N/A 10/10/2015   Procedure: COLONOSCOPY;  Surgeon: Daneil Dolin, MD;  [facility];  Service: Endoscopy;  Laterality: N/A;  1300  . HERNIA REPAIR    . RIGHT/LEFT HEART CATH AND CORONARY ANGIOGRAPHY N/A 08/04/2019   Procedure: RIGHT/LEFT HEART CATH AND CORONARY ANGIOGRAPHY;  Surgeon: Larey Dresser, MD;  Location: [facility] LAB;  Service: Cardiovascular;  Laterality: N/A;  . TEE WITHOUT CARDIOVERSION N/A 08/07/2019   Procedure: TRANSESOPHAGEAL ECHOCARDIOGRAM (TEE);  Surgeon: Larey Dresser, MD;  Location: [facility] ENDOSCOPY;  Service: Cardiovascular;  Laterality: N/A;    Medications:  Home meds:  No current facility-administered medications on file prior to encounter.   Current Outpatient Medications on File Prior to Encounter  Medication Sig Dispense Refill  . amiodarone (PACERONE) 200 MG tablet Take 1 tablet 2 times daily for 7 days, then reduce to 1 tablet once daily  (Patient taking differently: Take 200 mg by mouth daily. ) 45 tablet 3  . apixaban (ELIQUIS) 5 MG TABS tablet Take 1 tablet (5 mg total) by mouth 2 (two) times daily. 60 tablet 5  . atorvastatin (LIPITOR) 10 MG tablet Take 10 mg by mouth every evening.     . digoxin (LANOXIN) 0.125 MG tablet Take 1 tablet (0.125 mg total) by mouth daily. 30 tablet 3  . Multiple Vitamin (MULTIVITAMIN) tablet Take 1 tablet by mouth daily.    . sacubitril-valsartan (ENTRESTO) 24-26 MG Take 1 tablet by mouth 2 (two) times daily. 60 tablet 5  . spironolactone (ALDACTONE) 25 MG tablet Take 1 tablet (25 mg total) by mouth daily. 30 tablet 5  . torsemide (DEMADEX) 20 MG tablet Take 2 tablets (40 mg total) by mouth daily. 30 tablet 5  . traZODone (DESYREL) 50 MG tablet Take 50 mg by mouth at bedtime.       Scheduled Meds: Continuous Infusions: PRN Meds:.  Allergies: No Known Allergies  Family History  Problem Relation Age of Onset  . Colon cancer Neg Hx     Social History:  reports that he has never smoked. His smokeless tobacco use includes chew. He reports previous alcohol use. He reports that he does not use drugs.  ROS: A complete review of systems was performed.  All systems are negative except for pertinent findings as noted.  Physical Exam:  Vital signs in last 24 hours: Temp:  [97.8 F (36.6 C)] 97.8 F (36.6 C) (03/03 1942) Pulse Rate:  [82] 82 (03/03  1942) Resp:  [16] 16 (03/03 1942) BP: (121)/(76) 121/76 (03/03 1942) SpO2:  [96 %] 96 % (03/03 1942) Constitutional:  Alert and oriented, No acute distress Cardiovascular:, No JVD Respiratory: Normal respiratory effort GI: Suprapubic distention Genitourinary: Normal male phallus, testes are descended bilaterally and non-tender and without masses, scrotum is normal in appearance without lesions or masses, perineum is normal on inspection. Lymphatic: No lymphadenopathy Neurologic: Grossly intact, no focal deficits Psychiatric: Normal mood and  affect  Laboratory Data:  No results for input(s): WBC, HGB, HCT, PLT in the last 72 hours.  No results for input(s): NA, K, CL, GLUCOSE, BUN, CALCIUM, CREATININE in the last 72 hours.  Invalid input(s): CO3   No results found for this or any previous visit (from the past 24 hour(s)). Recent Results (from the past 240 hour(s))  Urine Culture     Status: None   Collection Time: 08/13/19  9:26 AM   Specimen: Urine, Clean Catch  Result Value Ref Range Status   Specimen Description   Final    URINE, CLEAN CATCH Performed at [facility], [address], [place], [place] [address]    Special Requests   Final    NONE Performed at [facility], [address], [place], [place] [address]    Culture   Final    NO GROWTH Performed at [facility], [place] [address] [place], [place] [address]    Report Status 08/14/2019 FINAL  Final    Renal Function: Recent Labs    08/13/19 2111  CREATININE 1.57*   Estimated Creatinine Clearance: 37.5 mL/min (A) (by C-G formula based on SCr of 1.57 mg/dL (H)).  Procedure: Under sterile conditions, I placed a 20 [place] coud catheter into the bladder with return of over 500 cc of grossly clear urine.  This was placed to straight drainage.  Impression/Recommendation Urinary retention with difficult urethral catheterization: He will be discharged home with his indwelling catheter.  Follow-up with Dr. Karsten Ro for further evaluation and discussion regarding treatment options.  Dutch Gray 08/16/2019, 9:47 PM    Pryor Curia MD  CC: Dr. Lacretia Leigh

## 2019-08-16 NOTE — ED Triage Notes (Signed)
Pt states that he had a foley catheter removed yesterday. Pt was able to urinate ok this morning, however, since 1000 he has not been able to urinate. Pt states he has some lower abdominal pressure and pain in his penis. Pt states he has hx of prostate issues.

## 2019-08-16 NOTE — ED Notes (Signed)
Bladder Scan - 211mL

## 2019-08-17 ENCOUNTER — Encounter (HOSPITAL_COMMUNITY): Payer: Self-pay

## 2019-08-17 ENCOUNTER — Ambulatory Visit (HOSPITAL_COMMUNITY)
Admit: 2019-08-17 | Discharge: 2019-08-17 | Disposition: A | Discharge status: Home / Self Care | Payer: No Typology Code available for payment source | Attending: Internal Medicine | Admitting: Internal Medicine

## 2019-08-17 ENCOUNTER — Other Ambulatory Visit (HOSPITAL_COMMUNITY): Payer: Self-pay

## 2019-08-17 VITALS — BP 112/66 | HR 79 | Wt 182.0 lb

## 2019-08-17 DIAGNOSIS — Z7901 Long term (current) use of anticoagulants: Secondary | ICD-10-CM | POA: Diagnosis not present

## 2019-08-17 DIAGNOSIS — I5022 Chronic systolic (congestive) heart failure: Secondary | ICD-10-CM | POA: Diagnosis present

## 2019-08-17 DIAGNOSIS — Z79899 Other long term (current) drug therapy: Secondary | ICD-10-CM | POA: Diagnosis not present

## 2019-08-17 DIAGNOSIS — N183 Chronic kidney disease, stage 3 unspecified: Secondary | ICD-10-CM | POA: Insufficient documentation

## 2019-08-17 DIAGNOSIS — N401 Enlarged prostate with lower urinary tract symptoms: Secondary | ICD-10-CM | POA: Diagnosis not present

## 2019-08-17 DIAGNOSIS — E785 Hyperlipidemia, unspecified: Secondary | ICD-10-CM | POA: Insufficient documentation

## 2019-08-17 DIAGNOSIS — I130 Hypertensive heart and chronic kidney disease with heart failure and stage 1 through stage 4 chronic kidney disease, or unspecified chronic kidney disease: Secondary | ICD-10-CM | POA: Diagnosis not present

## 2019-08-17 DIAGNOSIS — I4891 Unspecified atrial fibrillation: Secondary | ICD-10-CM

## 2019-08-17 DIAGNOSIS — Z20822 Contact with and (suspected) exposure to covid-19: Secondary | ICD-10-CM | POA: Insufficient documentation

## 2019-08-17 DIAGNOSIS — I4819 Other persistent atrial fibrillation: Secondary | ICD-10-CM | POA: Diagnosis not present

## 2019-08-17 LAB — COMPREHENSIVE METABOLIC PANEL
ALT: 36 U/L (ref 0–44)
AST: 40 U/L (ref 15–41)
Albumin: 3.6 g/dL (ref 3.5–5.0)
Alkaline Phosphatase: 93 U/L (ref 38–126)
Anion gap: 13 (ref 5–15)
BUN: 20 mg/dL (ref 8–23)
CO2: 27 mmol/L (ref 22–32)
Calcium: 9.6 mg/dL (ref 8.9–10.3)
Chloride: 94 mmol/L — ABNORMAL LOW (ref 98–111)
Creatinine, Ser: 1.71 mg/dL — ABNORMAL HIGH (ref 0.61–1.24)
GFR calc Af Amer: 43 mL/min — ABNORMAL LOW (ref >60)
GFR calc non Af Amer: 37 mL/min — ABNORMAL LOW (ref >60)
Glucose, Bld: 173 mg/dL — ABNORMAL HIGH (ref 70–99)
Potassium: 4.3 mmol/L (ref 3.5–5.1)
Sodium: 134 mmol/L — ABNORMAL LOW (ref 135–145)
Total Bilirubin: 1.2 mg/dL (ref 0.3–1.2)
Total Protein: 6.8 g/dL (ref 6.5–8.1)

## 2019-08-17 LAB — URINE CULTURE: Culture: NO GROWTH

## 2019-08-17 LAB — DIGOXIN LEVEL: Digoxin Level: 1.2 ng/mL (ref 0.8–2.0)

## 2019-08-17 LAB — TSH: TSH: 5.824 u[IU]/mL — ABNORMAL HIGH (ref 0.350–4.500)

## 2019-08-17 MED ORDER — DOXAZOSIN MESYLATE 4 MG PO TABS: 4.0000 mg | ORAL_TABLET | Freq: Every day | ORAL | 5 refills | Status: DC

## 2019-08-17 NOTE — Progress Notes (Signed)
Advanced Heart Failure Clinic Note   Referring Physician: PCP: [name], MD PCP-Cardiologist: Kate Sable, MD  AHF: Dr. Aundra Dubin   Reason for Visit: [facility] F/u for New Systolic Heart Failure and Atrial Fibrillation   HPI:  79 y/o male w/prior history of HTN and hyperlipidemia, who initially reported to [facility] w/ complaints of about 1 month of cough and dyspnea with exertion as well as progressive edema. No chest pain. He saw his PCPprior to coming to the [facility] 2/16 and was noted to be in atrial fibrillation with RVR and he was then sent to [facility] for admission. Hs-TnI was flat at 25 =>27. BNP elevated, 1,068.COVID negative.He was initially tried on diltiazem gtt but became hypotensiveand changed toamiodarone gtt. Echo was done, showing EF <20% with diffuse hypokinesis, severely decreased RV function.He was transferred to [facility] on 2/18for HF work up and cath.   On arrival to [facility], he was found to be volume overloaded w/ soft SBPs and rising SCr up to 1.9 with attempts at diuresis, concerning for low output. PICC line was placed to measure co-ox and CVP. Initial co-ox low at 46%. He was started on milrinone and IV Lasix. IV amiodarone continued for afib + IV heparin.RHC/LHC 2/19 with no coronary disease, mildly elevated filling pressures.Co-ox improved w/ milrinone. He was ultimately weaned off milrinone and co-ox remained stable. He diuresed well w/ IV Lasix and later changed to torsemide. SCr improved w/ diuresis, down to 1.32.   He was set up for TEE/DDCV on 2/22 and found to have LAA thrombus on TEE. DCCV canceled. IV amiodarone was discontinued and he was changed to PO, 200 mg bid. Placed on Eliquis 5 bid.ASA discontinued.Plan to repeat TEE/DCCV after 1 month of chronic anticoagulation. He was advised to continue amiodarone 200 mg bid x 7 days, followed by 200 mg daily.   Also of note, he develop BOO/ urinary retention due to BPH. Had over 500cc on bladder  scan.Multiple attempts were made by the nursing staff to place a foley which were unsuccessful. Urology was consulted and placed16 french council foley catheter. Subsequently, he had mild hematuria which ultimately cleared. Urology recommended foley to continue on discharge with plans to be followed closely in outpatient clinic. He was discharged from the hospital 2/24.   He had f/u w/ urology 3/2 and foley removed. After returning home, he redeveloped urinary retention and went to ED next day. Foley cath reinserted.   Today in f/u, EKG shows persistent atrial fibrillation w/ CVR 88 bpm. Wt today is 182 lb (discharge wt 192 lb). No dyspnea, LEE, orthopnea/PND. Denies CP. Fully compliant w/ Eliquis. No missed doses.     Review of Systems: [y] = yes, [ ]  = no   General: Weight gain [ ] ; Weight loss [ ] ; Anorexia [ ] ; Fatigue [ ] ; Fever [ ] ; Chills [ ] ; Weakness [ ]   Cardiac: Chest pain/pressure [ ] ; Resting SOB [ ] ; Exertional SOB [ ] ; Orthopnea [ ] ; Pedal Edema [ ] ; Palpitations [ ] ; Syncope [ ] ; Presyncope [ ] ; Paroxysmal nocturnal dyspnea[ ]   Pulmonary: Cough [ ] ; Wheezing[ ] ; Hemoptysis[ ] ; Sputum [ ] ; Snoring [ ]   GI: Vomiting[ ] ; Dysphagia[ ] ; Melena[ ] ; Hematochezia [ ] ; Heartburn[ ] ; Abdominal pain [ ] ; Constipation [ ] ; Diarrhea [ ] ; BRBPR [ ]   GU: Hematuria[ ] ; Dysuria [ ] ; Nocturia[ ]   Vascular: Pain in legs with walking [ ] ; Pain in feet with lying flat [ ] ; Non-healing sores [ ] ; Stroke [ ] ;  TIA [ ] ; Slurred speech [ ] ;  Neuro: Headaches[ ] ; Vertigo[ ] ; Seizures[ ] ; Paresthesias[ ] ;Blurred vision [ ] ; Diplopia [ ] ; Vision changes [ ]   Ortho/Skin: Arthritis [ ] ; Joint pain [ ] ; Muscle pain [ ] ; Joint swelling [ ] ; Back Pain [ ] ; Rash [ ]   Psych: Depression[ ] ; Anxiety[ ]   Heme: Bleeding problems [ ] ; Clotting disorders [ ] ; Anemia [ ]   Endocrine: Diabetes [ ] ; Thyroid dysfunction[ ]    Past Medical History:  Diagnosis Date  . Hyperlipemia   . Hypertension     Current  Outpatient Medications  Medication Sig Dispense Refill  . amiodarone (PACERONE) 200 MG tablet Take 200 mg by mouth daily.    [name] apixaban (ELIQUIS) 5 MG TABS tablet Take 1 tablet (5 mg total) by mouth 2 (two) times daily. 60 tablet 5  . atorvastatin (LIPITOR) 10 MG tablet Take 10 mg by mouth every evening.     . digoxin (LANOXIN) 0.125 MG tablet Take 1 tablet (0.125 mg total) by mouth daily. 30 tablet 3  . Multiple Vitamin (MULTIVITAMIN) tablet Take 1 tablet by mouth daily.    . sacubitril-valsartan (ENTRESTO) 24-26 MG Take 1 tablet by mouth 2 (two) times daily. 60 tablet 5  . spironolactone (ALDACTONE) 25 MG tablet Take 1 tablet (25 mg total) by mouth daily. 30 tablet 5  . torsemide (DEMADEX) 20 MG tablet Take 2 tablets (40 mg total) by mouth daily. 30 tablet 5  . traZODone (DESYREL) 50 MG tablet Take 50 mg by mouth at bedtime.     No current facility-administered medications for this encounter.    No Known Allergies    Social History   Socioeconomic History  . Marital status: Divorced    Spouse name: Not on file  . Number of children: Not on file  . Years of education: Not on file  . Highest education level: Not on file  Occupational History  . Not on file  Tobacco Use  . Smoking status: Never Smoker  . Smokeless tobacco: Current User    Types: Chew  . Tobacco comment: rarely chews tobacco  Substance and Sexual Activity  . Alcohol use: Not Currently    Alcohol/week: 0.0 standard drinks    Comment: pt says drank "pretty good" up until 2 or 3 months ago.  . Drug use: No  . Sexual activity: Not on file  Other Topics Concern  . Not on file  Social History Narrative  . Not on file   Social Determinants of Health   Financial Resource Strain:   . Difficulty of Paying Living Expenses: Not on file  Food Insecurity:   . Worried About Charity fundraiser in the Last Year: Not on file  . Ran Out of Food in the Last Year: Not on file  Transportation Needs:   . Lack of  Transportation (Medical): Not on file  . Lack of Transportation (Non-Medical): Not on file  Physical Activity:   . Days of Exercise per Week: Not on file  . Minutes of Exercise per Session: Not on file  Stress:   . Feeling of Stress : Not on file  Social Connections:   . Frequency of Communication with Friends and Family: Not on file  . Frequency of Social Gatherings with Friends and Family: Not on file  . Attends Religious Services: Not on file  . Active Member of Clubs or Organizations: Not on file  . Attends Archivist Meetings: Not on file  . Marital  Status: Not on file  Intimate Partner Violence:   . Fear of Current or Ex-Partner: Not on file  . Emotionally Abused: Not on file  . Physically Abused: Not on file  . Sexually Abused: Not on file      Family History  Problem Relation Age of Onset  . Colon cancer Neg Hx     Vitals:   08/17/19 1410  BP: 112/66  Pulse: 79  SpO2: 98%  Weight: 82.6 kg (182 lb)     PHYSICAL EXAM: General:  Well appearing elderly WM. No respiratory difficulty HEENT: normal Neck: supple. no JVD. Carotids 2+ bilat; no bruits. No lymphadenopathy or thyromegaly appreciated. Cor: PMI nondisplaced. Irregularly irregular rhythm. No rubs, gallops or murmurs. Lungs: clear Abdomen: soft, nontender, nondistended. No hepatosplenomegaly. No bruits or masses. Good bowel sounds. Extremities: no cyanosis, clubbing, rash, edema GU: indwelling foley catheter  Neuro: alert & oriented x 3, cranial nerves grossly intact. moves all 4 extremities w/o difficulty. Affect pleasant.  ECG: persistent atrial fibrillation w/ CVR 88 bpm    ASSESSMENT & PLAN:  1. Chronic systolic CHF: Echo 9/39 with EF <20%, severely decreased RV function. Nonischemic cardiomyopathy, ?tachycardia-mediated. No prior echo or history of CHF.He was in atrial fibrillation with RVR on recent hospital admit.  Milrinone started with low co-ox.  RHC/LHC 2/19 with no coronary disease,  mildly elevated filling pressures.  Weanned off milrinone w/ stable co-ox - Euvolemic on exam today. NYHA II-III - Continue torsemide 40 mg daily  - Continue digoxin 0.125 daily. Check digoxin level  - Continue spironolactone 25 mg daily.  - Continue Entresto 24-26 mg twice a day. BP too soft to titrate - Check BMP today  - Consider cardiac MRI when back in NSR (after future DCCV).  2. Persistent Atrial fibrillation: Admitted with afib/RVR of uncertain duration 0/30. Unsure if atrial fibrillation/RVR was cause of CMP (tachy-mediated) or an effect of the cardiomyopathy. Regardless, he will need restoration of NSR. TEE on 2/22 showed LA appendage thrombus.  - He will need 1 month therapeutic anticoagulation, then repeat TEE with DCCV if clot cleared. Plan re-attempt after 3/22.  - Continue po amiodarone. Will reduce down to 200 mg once daily. Check TSH and HFTs - Continue apixaban.    3. CKD stage 3: Initial creatinine during recent admission 1.5 but rose up to 1.9 and improved down to 1.3 w/ milrinone and diuresis.Suspect cardiorenal syndrome.  - repeat BMP today  4. BPH/OBB: continues w/ foley catheter per above. He denies hematuria. He is being followed by urology. Resume doxazosin 4 mg qhs (not ordered on discharge). He will monitor for signs of hypotension.   Will arrange TEE-DDCV after 3/22. F/u 1-2 weeks post cardioversion    Lyda Jester, [name] 08/17/19

## 2019-08-17 NOTE — H&P (View-Only) (Signed)
Advanced Heart Failure Clinic Note   Referring Physician: PCP: [name], MD PCP-Cardiologist: Kate Sable, MD  AHF: Dr. Aundra Dubin   Reason for Visit: [facility] F/u for New Systolic Heart Failure and Atrial Fibrillation   HPI:  79 y/o male w/prior history of HTN and hyperlipidemia, who initially reported to [facility] w/ complaints of about 1 month of cough and dyspnea with exertion as well as progressive edema. No chest pain. He saw his [facility]prior to [facility] and was noted to be in atrial fibrillation with RVR and he was then sent to [facility] for admission. Hs-TnI was flat at 25 =>27. BNP elevated, 1,068.COVID negative.He was initially tried on diltiazem gtt but became hypotensiveand changed toamiodarone gtt. Echo was done, showing EF <20% with diffuse hypokinesis, severely decreased RV function.He was transferred to [facility] on 2/18for HF work up and cath.   On arrival to [facility], he was found to be volume overloaded w/ soft SBPs and rising SCr up to 1.9 with attempts at diuresis, concerning for low output. PICC line was placed to measure co-ox and CVP. Initial co-ox low at 46%. He was started on milrinone and IV Lasix. IV amiodarone continued for afib + IV heparin.RHC/LHC 2/19 with no coronary disease, mildly elevated filling pressures.Co-ox improved w/ milrinone. He was ultimately weaned off milrinone and co-ox remained stable. He diuresed well w/ IV Lasix and later changed to torsemide. SCr improved w/ diuresis, down to 1.32.   He was set up for TEE/DDCV on 2/22 and found to have LAA thrombus on TEE. DCCV canceled. IV amiodarone was discontinued and he was changed to PO, 200 mg bid. Placed on Eliquis 5 bid.ASA discontinued.Plan to repeat TEE/DCCV after 1 month of chronic anticoagulation. He was advised to continue amiodarone 200 mg bid x 7 days, followed by 200 mg daily.   Also of note, he develop BOO/ urinary retention due to BPH. Had over 500cc on bladder  scan.Multiple attempts were made by the nursing staff to place a foley which were unsuccessful. Urology was consulted and placed16 french council foley catheter. Subsequently, he had mild hematuria which ultimately cleared. Urology recommended foley to continue on discharge with plans to be followed closely in outpatient clinic. He was discharged from the hospital 2/24.   He had f/u w/ urology 3/2 and foley removed. After returning home, he redeveloped urinary retention and went to ED next day. Foley cath reinserted.   Today in f/u, EKG shows persistent atrial fibrillation w/ CVR 88 bpm. Wt today is 182 lb (discharge wt 192 lb). No dyspnea, LEE, orthopnea/PND. Denies CP. Fully compliant w/ Eliquis. No missed doses.     Review of Systems: [y] = yes, [ ]  = no   General: Weight gain [ ] ; Weight loss [ ] ; Anorexia [ ] ; Fatigue [ ] ; Fever [ ] ; Chills [ ] ; Weakness [ ]   Cardiac: Chest pain/pressure [ ] ; Resting SOB [ ] ; Exertional SOB [ ] ; Orthopnea [ ] ; Pedal Edema [ ] ; Palpitations [ ] ; Syncope [ ] ; Presyncope [ ] ; Paroxysmal nocturnal dyspnea[ ]   Pulmonary: Cough [ ] ; Wheezing[ ] ; Hemoptysis[ ] ; Sputum [ ] ; Snoring [ ]   GI: Vomiting[ ] ; Dysphagia[ ] ; Melena[ ] ; Hematochezia [ ] ; Heartburn[ ] ; Abdominal pain [ ] ; Constipation [ ] ; Diarrhea [ ] ; BRBPR [ ]   GU: Hematuria[ ] ; Dysuria [ ] ; Nocturia[ ]   Vascular: Pain in legs with walking [ ] ; Pain in feet with lying flat [ ] ; Non-healing sores [ ] ; Stroke [ ] ;  TIA [ ] ; Slurred speech [ ] ;  Neuro: Headaches[ ] ; Vertigo[ ] ; Seizures[ ] ; Paresthesias[ ] ;Blurred vision [ ] ; Diplopia [ ] ; Vision changes [ ]   Ortho/Skin: Arthritis [ ] ; Joint pain [ ] ; Muscle pain [ ] ; Joint swelling [ ] ; Back Pain [ ] ; Rash [ ]   Psych: Depression[ ] ; Anxiety[ ]   Heme: Bleeding problems [ ] ; Clotting disorders [ ] ; Anemia [ ]   Endocrine: Diabetes [ ] ; Thyroid dysfunction[ ]    Past Medical History:  Diagnosis Date  . Hyperlipemia   . Hypertension     Current  Outpatient Medications  Medication Sig Dispense Refill  . amiodarone (PACERONE) 200 MG tablet Take 200 mg by mouth daily.    [name] apixaban (ELIQUIS) 5 MG TABS tablet Take 1 tablet (5 mg total) by mouth 2 (two) times daily. 60 tablet 5  . atorvastatin (LIPITOR) 10 MG tablet Take 10 mg by mouth every evening.     . digoxin (LANOXIN) 0.125 MG tablet Take 1 tablet (0.125 mg total) by mouth daily. 30 tablet 3  . Multiple Vitamin (MULTIVITAMIN) tablet Take 1 tablet by mouth daily.    . sacubitril-valsartan (ENTRESTO) 24-26 MG Take 1 tablet by mouth 2 (two) times daily. 60 tablet 5  . spironolactone (ALDACTONE) 25 MG tablet Take 1 tablet (25 mg total) by mouth daily. 30 tablet 5  . torsemide (DEMADEX) 20 MG tablet Take 2 tablets (40 mg total) by mouth daily. 30 tablet 5  . traZODone (DESYREL) 50 MG tablet Take 50 mg by mouth at bedtime.     No current facility-administered medications for this encounter.    No Known Allergies    Social History   Socioeconomic History  . Marital status: Divorced    Spouse name: Not on file  . Number of children: Not on file  . Years of education: Not on file  . Highest education level: Not on file  Occupational History  . Not on file  Tobacco Use  . Smoking status: Never Smoker  . Smokeless tobacco: Current User    Types: Chew  . Tobacco comment: rarely chews tobacco  Substance and Sexual Activity  . Alcohol use: Not Currently    Alcohol/week: 0.0 standard drinks    Comment: pt says drank "pretty good" up until 2 or 3 months ago.  . Drug use: No  . Sexual activity: Not on file  Other Topics Concern  . Not on file  Social History Narrative  . Not on file   Social Determinants of Health   Financial Resource Strain:   . Difficulty of Paying Living Expenses: Not on file  Food Insecurity:   . Worried About Charity fundraiser in the Last Year: Not on file  . Ran Out of Food in the Last Year: Not on file  Transportation Needs:   . Lack of  Transportation (Medical): Not on file  . Lack of Transportation (Non-Medical): Not on file  Physical Activity:   . Days of Exercise per Week: Not on file  . Minutes of Exercise per Session: Not on file  Stress:   . Feeling of Stress : Not on file  Social Connections:   . Frequency of Communication with Friends and Family: Not on file  . Frequency of Social Gatherings with Friends and Family: Not on file  . Attends Religious Services: Not on file  . Active Member of Clubs or Organizations: Not on file  . Attends Archivist Meetings: Not on file  . Marital  Status: Not on file  Intimate Partner Violence:   . Fear of Current or Ex-Partner: Not on file  . Emotionally Abused: Not on file  . Physically Abused: Not on file  . Sexually Abused: Not on file      Family History  Problem Relation Age of Onset  . Colon cancer Neg Hx     Vitals:   08/17/19 1410  BP: 112/66  Pulse: 79  SpO2: 98%  Weight: 82.6 kg (182 lb)     PHYSICAL EXAM: General:  Well appearing elderly WM. No respiratory difficulty HEENT: normal Neck: supple. no JVD. Carotids 2+ bilat; no bruits. No lymphadenopathy or thyromegaly appreciated. Cor: PMI nondisplaced. Irregularly irregular rhythm. No rubs, gallops or murmurs. Lungs: clear Abdomen: soft, nontender, nondistended. No hepatosplenomegaly. No bruits or masses. Good bowel sounds. Extremities: no cyanosis, clubbing, rash, edema GU: indwelling foley catheter  Neuro: alert & oriented x 3, cranial nerves grossly intact. moves all 4 extremities w/o difficulty. Affect pleasant.  ECG: persistent atrial fibrillation w/ CVR 88 bpm    ASSESSMENT & PLAN:  1. Chronic systolic CHF: Echo 9/32 with EF <20%, severely decreased RV function. Nonischemic cardiomyopathy, ?tachycardia-mediated. No prior echo or history of CHF.He was in atrial fibrillation with RVR on recent hospital admit.  Milrinone started with low co-ox.  RHC/LHC 2/19 with no coronary disease,  mildly elevated filling pressures.  Weanned off milrinone w/ stable co-ox - Euvolemic on exam today. NYHA II-III - Continue torsemide 40 mg daily  - Continue digoxin 0.125 daily. Check digoxin level  - Continue spironolactone 25 mg daily.  - Continue Entresto 24-26 mg twice a day. BP too soft to titrate - Check BMP today  - Consider cardiac MRI when back in NSR (after future DCCV).  2. Persistent Atrial fibrillation: Admitted with afib/RVR of uncertain duration 3/55. Unsure if atrial fibrillation/RVR was cause of CMP (tachy-mediated) or an effect of the cardiomyopathy. Regardless, he will need restoration of NSR. TEE on 2/22 showed LA appendage thrombus.  - He will need 1 month therapeutic anticoagulation, then repeat TEE with DCCV if clot cleared. Plan re-attempt after 3/22.  - Continue po amiodarone. Will reduce down to 200 mg once daily. Check TSH and HFTs - Continue apixaban.    3. CKD stage 3: Initial creatinine during recent admission 1.5 but rose up to 1.9 and improved down to 1.3 w/ milrinone and diuresis.Suspect cardiorenal syndrome.  - repeat BMP today  4. BPH/OBB: continues w/ foley catheter per above. He denies hematuria. He is being followed by urology. Resume doxazosin 4 mg qhs (not ordered on discharge). He will monitor for signs of hypotension.   Will arrange TEE-DDCV after 3/22. F/u 1-2 weeks post cardioversion    Lyda Jester, [name] 08/17/19

## 2019-08-17 NOTE — Patient Instructions (Signed)
RESTART Doxazosin 4mg  (1 tab) every night.   Labs today We will only contact you if something comes back abnormal or we need to make some changes. Otherwise no news is good news!  Your physician recommends that you schedule a follow-up appointment in: 1-2 weeks after Cardioversoin with NP/PA   Dear Mr Mory, [place] are scheduled for a TEE (Transesophageal Echocardiogram/Cardioversion on Friday, March 26th, 2021 with Dr. Aundra Dubin.  Please arrive at the [facility] (Main Entrance A) at [facility]: [address] [place], [place] [address] at 8:30 am.    DIET: Nothing to eat or drink after midnight except a sip of water with medications (see medication instructions below)  Medication Instructions: Hold non cardiac medications  Continue your anticoagulant: Eliquis You will need to continue your anticoagulant after your  procedure until you  are told by your.  Provider that it is safe  to stop   Labs: done while in clinic 08/17/19   You will need a pre procedure COVID test     WHEN:  Tuesday March 23rd, 2021at 11:10AM WHERE: [facility]       [place] [address]  This is a drive thru testing site, you will remain in your car. Be sure to get in the line FOR PROCEDURES Once you have been swabbed you will need to remain home in quarantine until you return for your procedure.   You must have a responsible person to drive you home and stay in the waiting area during your procedure. Failure to do so could result in cancellation.  Bring your insurance cards.  *Special Note: Every effort is made to have your procedure done on time. Occasionally there are emergencies that occur at the hospital that may cause delays. Please be patient if a delay does occur.

## 2019-08-18 ENCOUNTER — Telehealth (HOSPITAL_COMMUNITY): Payer: Self-pay

## 2019-08-18 DIAGNOSIS — I5022 Chronic systolic (congestive) heart failure: Secondary | ICD-10-CM

## 2019-08-18 MED ORDER — TORSEMIDE 20 MG PO TABS: 20.0000 mg | ORAL_TABLET | Freq: Every day | ORAL | 5 refills | Status: DC

## 2019-08-18 MED ORDER — DIGOXIN 125 MCG PO TABS: 0.0625 mg | ORAL_TABLET | Freq: Every day | ORAL | 3 refills | Status: DC

## 2019-08-18 NOTE — Telephone Encounter (Signed)
-----   Message from Consuelo Pandy, [place] sent at 08/17/2019  9:03 PM EST ----- ##Addition to previous result note.  In 1 week, repeat TSH, Free T3/T4

## 2019-08-18 NOTE — Telephone Encounter (Signed)
Called pt to review lab work and medication changes. Pt verbalized understanding. No further questions. Lab appt made.

## 2019-08-22 DIAGNOSIS — R338 Other retention of urine: Secondary | ICD-10-CM | POA: Diagnosis not present

## 2019-08-28 ENCOUNTER — Ambulatory Visit (HOSPITAL_COMMUNITY)
Admission: RE | Admit: 2019-08-28 | Discharge: 2019-08-28 | Disposition: A | Discharge status: Home / Self Care | Payer: Medicare Other | Source: Ambulatory Visit | Attending: Internal Medicine | Admitting: Internal Medicine

## 2019-08-28 ENCOUNTER — Other Ambulatory Visit: Payer: Self-pay

## 2019-08-28 DIAGNOSIS — I5022 Chronic systolic (congestive) heart failure: Secondary | ICD-10-CM

## 2019-08-28 LAB — BASIC METABOLIC PANEL
Anion gap: 13 (ref 5–15)
BUN: 21 mg/dL (ref 8–23)
CO2: 24 mmol/L (ref 22–32)
Calcium: 9.3 mg/dL (ref 8.9–10.3)
Chloride: 96 mmol/L — ABNORMAL LOW (ref 98–111)
Creatinine, Ser: 1.62 mg/dL — ABNORMAL HIGH (ref 0.61–1.24)
GFR calc Af Amer: 46 mL/min — ABNORMAL LOW (ref >60)
GFR calc non Af Amer: 39 mL/min — ABNORMAL LOW (ref >60)
Glucose, Bld: 149 mg/dL — ABNORMAL HIGH (ref 70–99)
Potassium: 4.2 mmol/L (ref 3.5–5.1)
Sodium: 133 mmol/L — ABNORMAL LOW (ref 135–145)

## 2019-08-28 LAB — BRAIN NATRIURETIC PEPTIDE: B Natriuretic Peptide: 362 pg/mL — ABNORMAL HIGH (ref 0.0–100.0)

## 2019-08-28 LAB — TSH: TSH: 10.24 u[IU]/mL — ABNORMAL HIGH (ref 0.350–4.500)

## 2019-08-28 LAB — T4, FREE: Free T4: 1.07 ng/dL (ref 0.61–1.12)

## 2019-08-28 LAB — DIGOXIN LEVEL: Digoxin Level: 0.9 ng/mL (ref 0.8–2.0)

## 2019-08-29 ENCOUNTER — Other Ambulatory Visit: Payer: Self-pay

## 2019-08-29 LAB — T3, FREE: T3, Free: 2.2 pg/mL (ref 2.0–4.4)

## 2019-08-29 NOTE — Patient Outreach (Signed)
[facility]) Care Management  08/29/2019  ASIM GERSTEN September 12, 1940 [id number]   Medication Adherence call to Mr. Chris Guerrero HIPPA Compliant Voice message left with a call back number. Mr. Christmas is showing past due on Losartan 50 mg under [place].   [place] Management Direct Dial [phone]  Fax [phone] [email or web address]

## 2019-09-05 ENCOUNTER — Other Ambulatory Visit (HOSPITAL_COMMUNITY)
Admission: RE | Admit: 2019-09-05 | Discharge: 2019-09-05 | Disposition: A | Discharge status: Home / Self Care | Payer: Medicare Other | Source: Ambulatory Visit | Attending: Cardiology | Admitting: Cardiology

## 2019-09-05 DIAGNOSIS — I428 Other cardiomyopathies: Secondary | ICD-10-CM | POA: Diagnosis not present

## 2019-09-05 DIAGNOSIS — I4819 Other persistent atrial fibrillation: Secondary | ICD-10-CM | POA: Diagnosis not present

## 2019-09-05 DIAGNOSIS — F17290 Nicotine dependence, other tobacco product, uncomplicated: Secondary | ICD-10-CM | POA: Diagnosis not present

## 2019-09-05 DIAGNOSIS — I5022 Chronic systolic (congestive) heart failure: Secondary | ICD-10-CM | POA: Diagnosis not present

## 2019-09-05 DIAGNOSIS — E785 Hyperlipidemia, unspecified: Secondary | ICD-10-CM | POA: Diagnosis not present

## 2019-09-05 DIAGNOSIS — Z20822 Contact with and (suspected) exposure to covid-19: Secondary | ICD-10-CM | POA: Diagnosis not present

## 2019-09-05 DIAGNOSIS — I130 Hypertensive heart and chronic kidney disease with heart failure and stage 1 through stage 4 chronic kidney disease, or unspecified chronic kidney disease: Secondary | ICD-10-CM | POA: Diagnosis not present

## 2019-09-05 DIAGNOSIS — R338 Other retention of urine: Secondary | ICD-10-CM | POA: Diagnosis not present

## 2019-09-05 DIAGNOSIS — Z79899 Other long term (current) drug therapy: Secondary | ICD-10-CM | POA: Diagnosis not present

## 2019-09-05 DIAGNOSIS — N183 Chronic kidney disease, stage 3 unspecified: Secondary | ICD-10-CM | POA: Diagnosis not present

## 2019-09-05 DIAGNOSIS — Z7901 Long term (current) use of anticoagulants: Secondary | ICD-10-CM | POA: Diagnosis not present

## 2019-09-05 LAB — SARS CORONAVIRUS 2 (TAT 6-24 HRS): SARS Coronavirus 2: NEGATIVE

## 2019-09-06 ENCOUNTER — Telehealth: Payer: Self-pay | Admitting: Cardiovascular Disease

## 2019-09-06 NOTE — Telephone Encounter (Signed)
New Message  [place] from [place] pre admissions tog et information about procedure schedule for 09/08/19 with Dr. Aundra Dubin. Transferred call to Heart Failure Clinic (Dr. Aundra Dubin office).

## 2019-09-08 ENCOUNTER — Other Ambulatory Visit: Payer: Self-pay

## 2019-09-08 ENCOUNTER — Ambulatory Visit (HOSPITAL_BASED_OUTPATIENT_CLINIC_OR_DEPARTMENT_OTHER)
Admission: RE | Admit: 2019-09-08 | Discharge: 2019-09-08 | Disposition: A | Discharge status: Home / Self Care | Payer: Medicare Other | Source: Ambulatory Visit | Attending: Cardiology | Admitting: Cardiology

## 2019-09-08 ENCOUNTER — Ambulatory Visit (HOSPITAL_COMMUNITY)
Admission: RE | Admit: 2019-09-08 | Discharge: 2019-09-08 | Disposition: A | Discharge status: Home / Self Care | Payer: Medicare Other | Attending: Cardiology | Admitting: Cardiology

## 2019-09-08 ENCOUNTER — Other Ambulatory Visit (HOSPITAL_COMMUNITY): Payer: Self-pay | Admitting: *Deleted

## 2019-09-08 ENCOUNTER — Ambulatory Visit (HOSPITAL_COMMUNITY): Payer: Medicare Other | Admitting: Certified Registered Nurse Anesthetist

## 2019-09-08 ENCOUNTER — Encounter (HOSPITAL_COMMUNITY)
Admission: RE | Disposition: A | Discharge status: Home / Self Care | Payer: Self-pay | Source: Home / Self Care | Attending: Cardiology

## 2019-09-08 DIAGNOSIS — E785 Hyperlipidemia, unspecified: Secondary | ICD-10-CM | POA: Insufficient documentation

## 2019-09-08 DIAGNOSIS — Z7901 Long term (current) use of anticoagulants: Secondary | ICD-10-CM | POA: Insufficient documentation

## 2019-09-08 DIAGNOSIS — Q211 Atrial septal defect: Secondary | ICD-10-CM | POA: Diagnosis not present

## 2019-09-08 DIAGNOSIS — Z20822 Contact with and (suspected) exposure to covid-19: Secondary | ICD-10-CM | POA: Diagnosis not present

## 2019-09-08 DIAGNOSIS — I4891 Unspecified atrial fibrillation: Secondary | ICD-10-CM

## 2019-09-08 DIAGNOSIS — I509 Heart failure, unspecified: Secondary | ICD-10-CM | POA: Diagnosis not present

## 2019-09-08 DIAGNOSIS — R338 Other retention of urine: Secondary | ICD-10-CM | POA: Diagnosis not present

## 2019-09-08 DIAGNOSIS — I5022 Chronic systolic (congestive) heart failure: Secondary | ICD-10-CM | POA: Insufficient documentation

## 2019-09-08 DIAGNOSIS — I130 Hypertensive heart and chronic kidney disease with heart failure and stage 1 through stage 4 chronic kidney disease, or unspecified chronic kidney disease: Secondary | ICD-10-CM | POA: Insufficient documentation

## 2019-09-08 DIAGNOSIS — Z79899 Other long term (current) drug therapy: Secondary | ICD-10-CM | POA: Diagnosis not present

## 2019-09-08 DIAGNOSIS — I4819 Other persistent atrial fibrillation: Secondary | ICD-10-CM | POA: Insufficient documentation

## 2019-09-08 DIAGNOSIS — I428 Other cardiomyopathies: Secondary | ICD-10-CM | POA: Insufficient documentation

## 2019-09-08 DIAGNOSIS — N183 Chronic kidney disease, stage 3 unspecified: Secondary | ICD-10-CM | POA: Diagnosis not present

## 2019-09-08 DIAGNOSIS — F17290 Nicotine dependence, other tobacco product, uncomplicated: Secondary | ICD-10-CM | POA: Insufficient documentation

## 2019-09-08 DIAGNOSIS — I110 Hypertensive heart disease with heart failure: Secondary | ICD-10-CM | POA: Diagnosis not present

## 2019-09-08 HISTORY — PX: TEE WITHOUT CARDIOVERSION: SHX5443

## 2019-09-08 HISTORY — PX: CARDIOVERSION: SHX1299

## 2019-09-08 LAB — POCT I-STAT, CHEM 8
BUN: 22 mg/dL (ref 8–23)
BUN: 31 mg/dL — ABNORMAL HIGH (ref 8–23)
Calcium, Ion: 1.19 mmol/L (ref 1.15–1.40)
Calcium, Ion: 1.1 mmol/L — ABNORMAL LOW (ref 1.15–1.40)
Chloride: 99 mmol/L (ref 98–111)
Chloride: 101 mmol/L (ref 98–111)
Creatinine, Ser: 1.6 mg/dL — ABNORMAL HIGH (ref 0.61–1.24)
Creatinine, Ser: 1.6 mg/dL — ABNORMAL HIGH (ref 0.61–1.24)
Glucose, Bld: 149 mg/dL — ABNORMAL HIGH (ref 70–99)
Glucose, Bld: 153 mg/dL — ABNORMAL HIGH (ref 70–99)
HCT: 43 % (ref 39.0–52.0)
HCT: 44 % (ref 39.0–52.0)
Hemoglobin: 14.6 g/dL (ref 13.0–17.0)
Hemoglobin: 15 g/dL (ref 13.0–17.0)
Potassium: 4.7 mmol/L (ref 3.5–5.1)
Potassium: 7.3 mmol/L (ref 3.5–5.1)
Sodium: 137 mmol/L (ref 135–145)
Sodium: 134 mmol/L — ABNORMAL LOW (ref 135–145)
TCO2: 27 mmol/L (ref 22–32)
TCO2: 31 mmol/L (ref 22–32)

## 2019-09-08 SURGERY — ECHOCARDIOGRAM, TRANSESOPHAGEAL
Anesthesia: Monitor Anesthesia Care

## 2019-09-08 MED ORDER — DEXMEDETOMIDINE HCL 200 MCG/2ML IV SOLN
INTRAVENOUS | Status: DC | PRN
  Administered 2019-09-08: 8 ug via INTRAVENOUS
  Administered 2019-09-08: 16 ug via INTRAVENOUS

## 2019-09-08 MED ORDER — PHENYLEPHRINE HCL-NACL 10-0.9 MG/250ML-% IV SOLN
INTRAVENOUS | Status: DC | PRN
  Administered 2019-09-08: 25 ug/min via INTRAVENOUS

## 2019-09-08 MED ORDER — PROPOFOL 10 MG/ML IV BOLUS
INTRAVENOUS | Status: DC | PRN
  Administered 2019-09-08: 20 mg via INTRAVENOUS

## 2019-09-08 MED ORDER — PROPOFOL 500 MG/50ML IV EMUL
INTRAVENOUS | Status: DC | PRN
  Administered 2019-09-08: 125 ug/kg/min via INTRAVENOUS

## 2019-09-08 NOTE — Anesthesia Procedure Notes (Signed)
Procedure Name: MAC Performed by: Rayshell Goecke B, CRNA Pre-anesthesia Checklist: Patient identified, Emergency Drugs available, Suction available, Patient being monitored and Timeout performed Patient Re-evaluated:Patient Re-evaluated prior to induction Oxygen Delivery Method: Nasal cannula Airway Equipment and Method: Bite block Placement Confirmation: positive ETCO2 Dental Injury: Teeth and Oropharynx as per pre-operative assessment        

## 2019-09-08 NOTE — Discharge Instructions (Signed)
Electrical Cardioversion   What can I expect after the procedure?  Your blood pressure, heart rate, breathing rate, and blood oxygen level will be monitored until you leave the hospital or clinic.  Your heart rhythm will be watched to make sure it does not change.  You may have some redness on the skin where the shocks were given. Follow these instructions at home:  Do not drive for 24 hours if you were given a sedative during your procedure.  Take over-the-counter and prescription medicines only as told by your health care provider.  Ask your health care provider how to check your pulse. Check it often.  Rest for 48 hours after the procedure or as told by your health care provider.  Avoid or limit your caffeine use as told by your health care provider.  Keep all follow-up visits as told by your health care provider. This is important. Contact a health care provider if:  You feel like your heart is beating too quickly or your pulse is not regular.  You have a serious muscle cramp that does not go away. Get help right away if:  You have discomfort in your chest.  You are dizzy or you feel faint.  You have trouble breathing or you are short of breath.  Your speech is slurred.  You have trouble moving an arm or leg on one side of your body.  Your fingers or toes turn cold or blue. Summary  Electrical cardioversion is the delivery of a jolt of electricity to restore a normal rhythm to the heart.  This procedure may be done right away in an emergency or may be a scheduled procedure if the condition is not an emergency.  Generally, this is a safe procedure.  After the procedure, check your pulse often as told by your health care provider. This information is not intended to replace advice given to you by your health care provider. Make sure you discuss any questions you have with your health care provider. Document Revised: 01/02/2019 Document Reviewed:  01/02/2019 Elsevier Patient Education  2020 [employer]  

## 2019-09-08 NOTE — Interval H&P Note (Signed)
History and Physical Interval Note:  09/08/2019 10:32 AM  Chris Guerrero  has presented today for surgery, with the diagnosis of afib.  The various methods of treatment have been discussed with the patient and family. After consideration of risks, benefits and other options for treatment, the patient has consented to  Procedure(s): TRANSESOPHAGEAL ECHOCARDIOGRAM (TEE) (N/A) CARDIOVERSION (N/A) as a surgical intervention.  The patient's history has been reviewed, patient examined, no change in status, stable for surgery.  I have reviewed the patient's chart and labs.  Questions were answered to the patient's satisfaction.     Sidi Dzikowski [employer]

## 2019-09-08 NOTE — Procedures (Signed)
Electrical Cardioversion Procedure Note JONAS GOH [id number] 04/28/1941  Procedure: Electrical Cardioversion Indications:  Atrial Fibrillation  Procedure Details Consent: Risks of procedure as well as the alternatives and risks of each were explained to the (patient/caregiver).  Consent for procedure obtained. Time Out: Verified patient identification, verified procedure, site/side was marked, verified correct patient position, special equipment/implants available, medications/allergies/relevent history reviewed, required imaging and test results available.  Performed  Patient placed on cardiac monitor, pulse oximetry, supplemental oxygen as necessary.  Sedation given: Propofol per anesthesiology Pacer pads placed anterior and posterior chest.  Cardioverted 1 time(s).  Cardioverted at [place].  Evaluation Findings: Post procedure EKG shows: NSR Complications: None Patient did tolerate procedure well.   Loralie Champagne 09/08/2019, 10:46 AM

## 2019-09-08 NOTE — CV Procedure (Signed)
Procedure: TEE  Indication: Atrial fibrillation  Sedation: Per anesthesiology  Findings: Please see echo section for full report.  Mildly dilated LV with normal wall thickness.  Diffuse hypokinesis with EF 25-30%.  Mildly dilated RV with normal systolic function.  Moderate left atrial enlargement, no LA appendage thrombus. Moderate right atrial enlargement.  There was a small PFO by color doppler.  Trivial TR.  Trivial MR.  Trileaflet aortic valve with no regurgitation or stenosis.  Normal caliber thoracic aorta with grade 3 plaque in the descending thoracic aorta.   Impression: LA appendage thrombus resolved, may proceed to TEE.  Chris Guerrero 09/08/2019 10:46 AM

## 2019-09-08 NOTE — Transfer of Care (Signed)
Immediate Anesthesia Transfer of Care Note  Patient: Chris Guerrero  Procedure(s) Performed: TRANSESOPHAGEAL ECHOCARDIOGRAM (TEE) (N/A ) CARDIOVERSION (N/A )  Patient Location: Endoscopy Unit  Anesthesia Type:MAC and General  Level of Consciousness: drowsy  Airway & Oxygen Therapy: Patient Spontanous Breathing and Patient connected to nasal cannula oxygen  Post-op Assessment: Report given to RN and Post -op Vital signs reviewed and stable  Post vital signs: Reviewed and stable  Last Vitals:  Vitals Value Taken Time  BP    Temp    Pulse 57 09/08/19 1054  Resp 9 09/08/19 1054  SpO2 95 % 09/08/19 1054  Vitals shown include unvalidated device data.  Last Pain:  Vitals:   09/08/19 0925  TempSrc: Oral  PainSc: 0-No pain         Complications: No apparent anesthesia complications

## 2019-09-08 NOTE — Anesthesia Preprocedure Evaluation (Addendum)
Anesthesia Evaluation  Patient identified by MRN, date of birth, ID band Patient awake    Reviewed: Allergy & Precautions, NPO status , Patient's Chart, lab work & pertinent test results  Airway Mallampati: II  TM Distance: >3 FB Neck ROM: Full    Dental  (+) Teeth Intact, Dental Advisory Given   Pulmonary     + decreased breath sounds      Cardiovascular hypertension, Pt. on medications +CHF  + dysrhythmias Atrial Fibrillation  Rhythm:Irregular Rate:Normal     Neuro/Psych    GI/Hepatic   Endo/Other    Renal/GU      Musculoskeletal   Abdominal   Peds  Hematology   Anesthesia Other Findings   Reproductive/Obstetrics                            Anesthesia Physical  Anesthesia Plan  ASA: III  Anesthesia Plan: MAC   Post-op Pain Management:    Induction: Intravenous  PONV Risk Score and Plan: Ondansetron and Propofol infusion  Airway Management Planned: Natural Airway, Simple Face Mask and Nasal Cannula  Additional Equipment:   Intra-op Plan:   Post-operative Plan:   Informed Consent: I have reviewed the patients History and Physical, chart, labs and discussed the procedure including the risks, benefits and alternatives for the proposed anesthesia with the patient or authorized representative who has indicated his/her understanding and acceptance.     Dental advisory given  Plan Discussed with: CRNA, Anesthesiologist and Surgeon  Anesthesia Plan Comments:         Anesthesia Quick Evaluation

## 2019-09-08 NOTE — Anesthesia Postprocedure Evaluation (Signed)
Anesthesia Post Note  Patient: Chris Guerrero  Procedure(s) Performed: TRANSESOPHAGEAL ECHOCARDIOGRAM (TEE) (N/A ) CARDIOVERSION (N/A )     Patient location during evaluation: PACU Anesthesia Type: MAC and General Level of consciousness: awake and alert Pain management: pain level controlled Vital Signs Assessment: post-procedure vital signs reviewed and stable Respiratory status: spontaneous breathing, nonlabored ventilation, respiratory function stable and patient connected to nasal cannula oxygen Cardiovascular status: blood pressure returned to baseline and stable Postop Assessment: no apparent nausea or vomiting Anesthetic complications: no    Last Vitals:  Vitals:   09/08/19 1051 09/08/19 1055  BP:  (!) 84/50  Resp:  16  Temp: 36.7 C 36.7 C  SpO2:  96%    Last Pain:  Vitals:   09/08/19 1055  TempSrc: Oral  PainSc: 0-No pain                 Dimetri Armitage

## 2019-09-19 DIAGNOSIS — R338 Other retention of urine: Secondary | ICD-10-CM | POA: Diagnosis not present

## 2019-09-21 ENCOUNTER — Other Ambulatory Visit: Payer: Self-pay

## 2019-09-21 ENCOUNTER — Ambulatory Visit (HOSPITAL_COMMUNITY)
Admission: RE | Admit: 2019-09-21 | Discharge: 2019-09-21 | Disposition: A | Discharge status: Home / Self Care | Payer: Medicare Other | Source: Ambulatory Visit | Attending: Adult Health | Admitting: Adult Health

## 2019-09-21 VITALS — BP 112/70 | HR 91 | Wt 183.8 lb

## 2019-09-21 DIAGNOSIS — I428 Other cardiomyopathies: Secondary | ICD-10-CM | POA: Diagnosis not present

## 2019-09-21 DIAGNOSIS — N183 Chronic kidney disease, stage 3 unspecified: Secondary | ICD-10-CM | POA: Insufficient documentation

## 2019-09-21 DIAGNOSIS — I130 Hypertensive heart and chronic kidney disease with heart failure and stage 1 through stage 4 chronic kidney disease, or unspecified chronic kidney disease: Secondary | ICD-10-CM | POA: Diagnosis not present

## 2019-09-21 DIAGNOSIS — I480 Paroxysmal atrial fibrillation: Secondary | ICD-10-CM | POA: Diagnosis not present

## 2019-09-21 DIAGNOSIS — N401 Enlarged prostate with lower urinary tract symptoms: Secondary | ICD-10-CM | POA: Diagnosis not present

## 2019-09-21 DIAGNOSIS — I4819 Other persistent atrial fibrillation: Secondary | ICD-10-CM | POA: Insufficient documentation

## 2019-09-21 DIAGNOSIS — N1831 Chronic kidney disease, stage 3a: Secondary | ICD-10-CM

## 2019-09-21 DIAGNOSIS — I5022 Chronic systolic (congestive) heart failure: Secondary | ICD-10-CM | POA: Diagnosis not present

## 2019-09-21 DIAGNOSIS — N138 Other obstructive and reflux uropathy: Secondary | ICD-10-CM | POA: Insufficient documentation

## 2019-09-21 DIAGNOSIS — I10 Essential (primary) hypertension: Secondary | ICD-10-CM | POA: Diagnosis present

## 2019-09-21 DIAGNOSIS — E785 Hyperlipidemia, unspecified: Secondary | ICD-10-CM | POA: Insufficient documentation

## 2019-09-21 DIAGNOSIS — Z7901 Long term (current) use of anticoagulants: Secondary | ICD-10-CM | POA: Diagnosis not present

## 2019-09-21 DIAGNOSIS — Z79899 Other long term (current) drug therapy: Secondary | ICD-10-CM | POA: Diagnosis not present

## 2019-09-21 NOTE — Patient Instructions (Signed)
No medication changes today!  Your physician recommends that you schedule a follow-up appointment in: 6 weeks with Dr Aundra Dubin.   Please call office at [phone] option 2 if you have any questions or concerns.   At the [facility], you and your health needs are our priority. As part of our continuing mission to provide you with exceptional heart care, we have created designated Provider Care Teams. These Care Teams include your primary Cardiologist (physician) and Advanced Practice Providers (APPs- Physician Assistants and Nurse Practitioners) who all work together to provide you with the care you need, when you need it.   You may see any of the following providers on your designated Care Team at your next follow up: [name] Dr Glori Bickers . Dr Loralie Champagne . Darrick Grinder, NP . Lyda Jester, PA . Audry Riles, PharmD   Please be sure to bring in all your medications bottles to every appointment.

## 2019-09-21 NOTE — Progress Notes (Signed)
t  Advanced Heart Failure Clinic Note    PCP: [name], MD PCP-Cardiologist: Kate Sable, MD  AHF: Dr. Aundra Dubin  Urology: Dr Karsten Ro   HPI: 79 y/o male w/prior history of HTN and hyperlipidemia, who initially reported to [facility] w/ complaints of about 1 month of cough and dyspnea with exertion as well as progressive edema. No chest pain. He saw his PCPprior to coming [facility] the [facility] 2/16 and was noted to be in atrial fibrillation with RVR and he was then sent to [facility] for admission. Hs-TnI was flat at 25 =>27. BNP elevated, 1,068.COVID negative.He was initially tried on diltiazem gtt but became hypotensiveand changed toamiodarone gtt. Echo was done, showing EF <20% with diffuse hypokinesis, severely decreased RV function.He was transferred to [facility] on 2/18for HF work up and cath.   On arrival to [facility], he was found to be volume overloaded w/ soft SBPs and rising SCr up to 1.9 with attempts at diuresis, concerning for low output. PICC line was placed to measure co-ox and CVP. Initial co-ox low at 46%. He was started on milrinone and IV Lasix. IV amiodarone continued for afib + IV heparin.RHC/LHC 2/19 with no coronary disease, mildly elevated filling pressures.Co-ox improved w/ milrinone. He was ultimately weaned off milrinone and co-ox remained stable. He diuresed well w/ IV Lasix and later changed to torsemide. SCr improved w/ diuresis, down to 1.32.   He was set up for TEE/DDCV on 2/22 and found to have LAA thrombus on TEE. DCCV canceled. IV amiodarone was discontinued and he was changed to PO, 200 mg bid. Placed on Eliquis 5 bid.ASA discontinued.Plan to repeat TEE/DCCV after 1 month of chronic anticoagulation. He was advised to continue amiodarone 200 mg bid x 7 days, followed by 200 mg daily.   Also of note, he develop BOO/ urinary retention due to BPH. Had over 500cc on bladder scan.Multiple attempts were made by the nursing staff to place a foley which were unsuccessful.  Urology was consulted and placed16 french council foley catheter. Subsequently, he had mild hematuria which ultimately cleared. Urology recommended foley to continue on discharge with plans to be followed closely in outpatient clinic. He was discharged from the hospital 2/24.   He had f/u w/ urology 3/2 and foley removed. After returning home, he redeveloped urinary retention and went to ED next day. Foley cath reinserted.   S/P DC-CV 3/26 with restoration of NSR.   Today he returns for HF follow up. Wants to come off eliquis so he can can get prostate procedure. He wants catheter out. No hematuria. Overall feeling fine. Started walking a little more. Denies SOB/PND/Orthopnea. Appetite ok. No fever or chills. Weight at home 179-182  pounds. Taking all medications.    Past Medical History:  Diagnosis Date  . Hyperlipemia   . Hypertension     Current Outpatient Medications  Medication Sig Dispense Refill  . amiodarone (PACERONE) 200 MG tablet Take 200 mg by mouth daily.    [name] apixaban (ELIQUIS) 5 MG TABS tablet Take 1 tablet (5 mg total) by mouth 2 (two) times daily. 60 tablet 5  . atorvastatin (LIPITOR) 10 MG tablet Take 10 mg by mouth every evening.     . digoxin (LANOXIN) 0.125 MG tablet Take 0.5 tablets (0.0625 mg total) by mouth daily. 30 tablet 3  . Multiple Vitamin (MULTIVITAMIN) tablet Take 1 tablet by mouth daily.    . sacubitril-valsartan (ENTRESTO) 24-26 MG Take 1 tablet by mouth 2 (two) times daily. 60 tablet 5  .  spironolactone (ALDACTONE) 25 MG tablet Take 1 tablet (25 mg total) by mouth daily. 30 tablet 5  . torsemide (DEMADEX) 20 MG tablet Take 1 tablet (20 mg total) by mouth daily. 30 tablet 5  . traZODone (DESYREL) 50 MG tablet Take 50 mg by mouth at bedtime.     No current facility-administered medications for this encounter.    No Known Allergies    Social History   Socioeconomic History  . Marital status: Divorced    Spouse name: Not on file  . Number of  children: Not on file  . Years of education: Not on file  . Highest education level: Not on file  Occupational History  . Not on file  Tobacco Use  . Smoking status: Never Smoker  . Smokeless tobacco: Current User    Types: Chew  . Tobacco comment: rarely chews tobacco  Substance and Sexual Activity  . Alcohol use: Not Currently    Alcohol/week: 0.0 standard drinks    Comment: pt says drank "pretty good" up until 2 or 3 months ago.  . Drug use: No  . Sexual activity: Not on file  Other Topics Concern  . Not on file  Social History Narrative  . Not on file   Social Determinants of Health   Financial Resource Strain:   . Difficulty of Paying Living Expenses:   Food Insecurity:   . Worried About Charity fundraiser in the Last Year:   . Arboriculturist in the Last Year:   Transportation Needs:   . Film/video editor (Medical):   [name] Lack of Transportation (Non-Medical):   Physical Activity:   . Days of Exercise per Week:   . Minutes of Exercise per Session:   Stress:   . Feeling of Stress :   Social Connections:   . Frequency of Communication with Friends and Family:   . Frequency of Social Gatherings with Friends and Family:   . Attends Religious Services:   . Active Member of Clubs or Organizations:   . Attends Archivist Meetings:   [name] Marital Status:   Intimate Partner Violence:   . Fear of Current or Ex-Partner:   . Emotionally Abused:   [name] Physically Abused:   . Sexually Abused:       Family History  Problem Relation Age of Onset  . Colon cancer Neg Hx     Vitals:   09/21/19 0852  BP: 112/70  Pulse: 91  SpO2: 98%  Weight: 83.4 kg (183 lb 12.8 oz)   Wt Readings from Last 3 Encounters:  09/21/19 83.4 kg (183 lb 12.8 oz)  08/17/19 82.6 kg (182 lb)  08/13/19 82.1 kg (181 lb)     PHYSICAL EXAM: General:  Walked in the clinic. No resp difficulty HEENT: normal Neck: supple. no JVD. Carotids 2+ bilat; no bruits. No lymphadenopathy or  thryomegaly appreciated. Cor: PMI nondisplaced. Regular rate & rhythm. No rubs, gallops or murmurs. Lungs: clear Abdomen: soft, nontender, nondistended. No hepatosplenomegaly. No bruits or masses. Good bowel sounds. Extremities: no cyanosis, clubbing, rash, edema Neuro: alert & orientedx3, cranial nerves grossly intact. moves all 4 extremities w/o difficulty. Affect pleasant  ECG: SR with PAC/PVCs 84 bpm   ASSESSMENT & PLAN:  1. Chronic systolic CHF: Echo 8/09 with EF <20%, severely decreased RV function. Nonischemic cardiomyopathy, ?tachycardia-mediated. No prior echo or history of CHF.He was in atrial fibrillation with RVR on recent hospital admit.  Milrinone started with low co-ox.  RHC/LHC 2/19 with  no coronary disease, mildly elevated filling pressures.   - NYHA II.  - NO bb for now.   - Continue torsemide 20 mg daily  - Continue digoxin 0.0625 daily.  - Continue spironolactone 25 mg daily.  - Continue Entresto 24-26 mg twice a day.  -- Consider cardiac MRI   2. Persistent Atrial fibrillation: Admitted with afib/RVR of uncertain duration 4/91. Unsure if atrial fibrillation/RVR was cause of CMP (tachy-mediated) or an effect of the cardiomyopathy. Regardless, he will need restoration of NSR. TEE on 2/22 showed LA appendage thrombus.  S/P DC-CV on 3/26 with conversion to NSR  - Maintaining NSR.  -  Continue po amiodarone. Recent TSH/LFTs stable.  - Continue apixaban needs to continue at least 4 weeks after     3. CKD stage 3:  Check BMET today.    4. BPH/OBB: continues w/ foley catheter per above. No hematuria. He is being followed by urology.  - Will need to check with Dr Aundra Dubin regarding timing to come of apixaban.   Follow up in 4-6 weeks with Dr Aundra Dubin.   Darrick Grinder, NP 09/21/19

## 2019-09-29 ENCOUNTER — Other Ambulatory Visit (HOSPITAL_COMMUNITY): Payer: Self-pay

## 2019-09-29 MED ORDER — SACUBITRIL-VALSARTAN 24-26 MG PO TABS: 1.0000 | ORAL_TABLET | Freq: Two times a day (BID) | ORAL | 3 refills | Status: DC

## 2019-09-29 MED ORDER — APIXABAN 5 MG PO TABS: 5.0000 mg | ORAL_TABLET | Freq: Two times a day (BID) | ORAL | 3 refills | Status: DC

## 2019-09-29 MED ORDER — TORSEMIDE 20 MG PO TABS: 20.0000 mg | ORAL_TABLET | Freq: Every day | ORAL | 3 refills | Status: DC

## 2019-09-29 MED ORDER — AMIODARONE HCL 200 MG PO TABS: 200.0000 mg | ORAL_TABLET | Freq: Every day | ORAL | 3 refills | Status: DC

## 2019-09-29 MED ORDER — SPIRONOLACTONE 25 MG PO TABS: 25.0000 mg | ORAL_TABLET | Freq: Every day | ORAL | 3 refills | Status: DC

## 2019-09-29 MED ORDER — DIGOXIN 125 MCG PO TABS: 0.0625 mg | ORAL_TABLET | Freq: Every day | ORAL | 3 refills | Status: DC

## 2019-10-16 DIAGNOSIS — I10 Essential (primary) hypertension: Secondary | ICD-10-CM | POA: Diagnosis not present

## 2019-10-16 DIAGNOSIS — Z79899 Other long term (current) drug therapy: Secondary | ICD-10-CM | POA: Diagnosis not present

## 2019-10-16 DIAGNOSIS — E785 Hyperlipidemia, unspecified: Secondary | ICD-10-CM | POA: Diagnosis not present

## 2019-10-16 DIAGNOSIS — E1129 Type 2 diabetes mellitus with other diabetic kidney complication: Secondary | ICD-10-CM | POA: Diagnosis not present

## 2019-10-18 ENCOUNTER — Telehealth (HOSPITAL_COMMUNITY): Payer: Self-pay | Admitting: *Deleted

## 2019-10-18 NOTE — Telephone Encounter (Signed)
Pt left a VM asking for a return call. I called pt back no answer/left vm requesting return call.

## 2019-10-20 ENCOUNTER — Other Ambulatory Visit: Payer: Self-pay | Admitting: Urology

## 2019-10-23 DIAGNOSIS — I5022 Chronic systolic (congestive) heart failure: Secondary | ICD-10-CM | POA: Diagnosis not present

## 2019-10-23 DIAGNOSIS — I482 Chronic atrial fibrillation, unspecified: Secondary | ICD-10-CM | POA: Diagnosis not present

## 2019-10-23 DIAGNOSIS — E1129 Type 2 diabetes mellitus with other diabetic kidney complication: Secondary | ICD-10-CM | POA: Diagnosis not present

## 2019-10-24 ENCOUNTER — Encounter (HOSPITAL_COMMUNITY): Payer: Self-pay

## 2019-10-24 ENCOUNTER — Other Ambulatory Visit: Payer: Self-pay

## 2019-10-24 ENCOUNTER — Other Ambulatory Visit (HOSPITAL_COMMUNITY)
Admission: RE | Admit: 2019-10-24 | Discharge: 2019-10-24 | Disposition: A | Discharge status: Home / Self Care | Payer: Medicare Other | Source: Ambulatory Visit | Attending: Urology | Admitting: Urology

## 2019-10-24 DIAGNOSIS — Z01812 Encounter for preprocedural laboratory examination: Secondary | ICD-10-CM | POA: Diagnosis not present

## 2019-10-24 DIAGNOSIS — Z20822 Contact with and (suspected) exposure to covid-19: Secondary | ICD-10-CM | POA: Insufficient documentation

## 2019-10-24 LAB — SARS CORONAVIRUS 2 (TAT 6-24 HRS): SARS Coronavirus 2: NEGATIVE

## 2019-10-25 ENCOUNTER — Encounter (HOSPITAL_COMMUNITY): Payer: Self-pay

## 2019-10-25 ENCOUNTER — Telehealth (HOSPITAL_COMMUNITY): Payer: Self-pay

## 2019-10-25 ENCOUNTER — Encounter (HOSPITAL_COMMUNITY)
Admission: RE | Admit: 2019-10-25 | Discharge: 2019-10-25 | Disposition: A | Discharge status: Home / Self Care | Payer: No Typology Code available for payment source | Source: Ambulatory Visit | Attending: Urology | Admitting: Urology

## 2019-10-25 ENCOUNTER — Other Ambulatory Visit: Payer: Self-pay

## 2019-10-25 ENCOUNTER — Encounter (HOSPITAL_COMMUNITY): Admission: RE | Admit: 2019-10-25 | Payer: No Typology Code available for payment source | Source: Ambulatory Visit

## 2019-10-25 HISTORY — DX: Type 2 diabetes mellitus without complications: E11.9

## 2019-10-25 HISTORY — DX: Other cardiomyopathies: I42.8

## 2019-10-25 HISTORY — DX: Heart failure, unspecified: I50.9

## 2019-10-25 HISTORY — DX: Personal history of urinary calculi: Z87.442

## 2019-10-25 HISTORY — DX: Unspecified atrial fibrillation: I48.91

## 2019-10-25 HISTORY — DX: Benign prostatic hyperplasia with lower urinary tract symptoms: N13.8

## 2019-10-25 HISTORY — DX: Chronic kidney disease, stage 3 unspecified: N18.30

## 2019-10-25 HISTORY — DX: Benign prostatic hyperplasia with lower urinary tract symptoms: N40.1

## 2019-10-25 NOTE — Telephone Encounter (Signed)
Cards clearance faxed to [facility]

## 2019-10-25 NOTE — Patient Instructions (Signed)
DUE TO COVID-19 ONLY ONE VISITOR ARE ALLOWED TO COME WITH YOU AND STAY IN THE WAITING ROOM ONLY DURING PRE OP AND PROCEDURE. THEN TWO VISITORS MAY VISIT WITH YOU IN YOUR PRIVATE ROOM DURING VISITING HOURS ONLY!!   COVID SWAB TESTING COMPLETED ON:  Oct 24, 2019     (Must self quarantine after testing. Follow instructions on handout.)             Your procedure is scheduled on: Friday, Oct 27, 2019   Report to [facility] Main  Entrance   Report to Short Stay at 5:30 AM   [facility])   Call this number if you have problems the morning of surgery [phone]   Do not eat food or drink liquids :After Midnight.  Oral Hygiene is also important to reduce your risk of infection.                                    Remember - BRUSH YOUR TEETH THE MORNING OF SURGERY WITH YOUR REGULAR TOOTHPASTE   Do NOT smoke after Midnight   Take these medicines the morning of surgery with A SIP OF WATER: Digoxin  DO NOT TAKE ANY ORAL DIABETIC MEDICATIONS DAY OF YOUR SURGERY                               You may not have any metal on your body including jewelry, and body piercings             Do not wear lotions, powders, perfumes/cologne, or deodorant                          Men may shave face and neck.   Do not bring valuables to the hospital. [place].   Contacts, dentures or bridgework may not be worn into surgery.   Bring small overnight bag day of surgery.    Patients discharged the day of surgery will not be allowed to drive home.   Special Instructions: Bring a copy of your healthcare power of attorney and living will documents         the day of surgery if you haven't scanned them in before.              Please read over the following fact sheets you were given: IF YOU HAVE QUESTIONS ABOUT YOUR PRE OP INSTRUCTIONS PLEASE CALL [phone]   [place] - Preparing for Surgery Before surgery, you can play an important role.   Because skin is not sterile, your skin needs to be as free of germs as possible.  You can reduce the number of germs on your skin by washing with CHG (chlorahexidine gluconate) soap before surgery.  CHG is an antiseptic cleaner which kills germs and bonds with the skin to continue killing germs even after washing. Please DO NOT use if you have an allergy to CHG or antibacterial soaps.  If your skin becomes reddened/irritated stop using the CHG and inform your nurse when you arrive at Short Stay. Do not shave (including legs and underarms) for at least 48 hours prior to the first CHG shower.  You may shave your face/neck.  Please follow these instructions carefully:  1.  Shower with CHG Soap the night before surgery and the  morning of surgery.  2.  If you choose to wash your hair, wash your hair first as usual with your normal  shampoo.  3.  After you shampoo, rinse your hair and body thoroughly to remove the shampoo.                             4.  Use CHG as you would any other liquid soap.  You can apply chg directly to the skin and wash.  Gently with a scrungie or clean washcloth.  5.  Apply the CHG Soap to your body ONLY FROM THE NECK DOWN.   Do   not use on face/ open                           Wound or open sores. Avoid contact with eyes, ears mouth and   genitals (private parts).                       Wash face,  Genitals (private parts) with your normal soap.             6.  Wash thoroughly, paying special attention to the area where your    surgery  will be performed.  7.  Thoroughly rinse your body with warm water from the neck down.  8.  DO NOT shower/wash with your normal soap after using and rinsing off the CHG Soap.                9.  Pat yourself dry with a clean towel.            10.  Wear clean pajamas.            11.  Place clean sheets on your bed the night of your first shower and do not  sleep with pets. Day of Surgery : Do not apply any lotions/deodorants the morning of  surgery.  Please wear clean clothes to the hospital/surgery center.  FAILURE TO FOLLOW THESE INSTRUCTIONS MAY RESULT IN THE CANCELLATION OF YOUR SURGERY  PATIENT SIGNATURE_________________________________  NURSE SIGNATURE__________________________________  ________________________________________________________________________

## 2019-10-25 NOTE — Progress Notes (Signed)
Has completed COVID 19 vaccine series   PCP - Dr. Salena Saner Cardiologist - Dr. Aundra Dubin A. Clegg NP last office visit 09/21/19  Chest x-ray - 08/03/19 in epic EKG - 09/21/19 in epic  Stress Test - N/A ECHO - 09/08/19 in epic   Cardiac Cath - 08/04/19  in epic   Sleep Study - N/A CPAP - N/A  Fasting Blood Sugar -  200 Checks Blood Sugar __1___ times a day  Blood Thinner Instructions: Eliquis no instructions given to patient called to inform Chris Guerrero Aspirin Instructions: N/A Last Dose:N/A  Anesthesia review: CHF, Afib, Nonischemic cardiomyopathy, CKDIII  Patient denies shortness of breath, fever, cough and chest pain at PAT appointment   Patient verbalized understanding of instructions that were given to them at the PAT appointment. Patient was also instructed that they will need to review over the PAT instructions again at home before surgery.

## 2019-11-06 ENCOUNTER — Ambulatory Visit (HOSPITAL_COMMUNITY)
Admission: RE | Admit: 2019-11-06 | Discharge: 2019-11-06 | Disposition: A | Discharge status: Home / Self Care | Payer: Medicare Other | Source: Ambulatory Visit | Attending: Cardiology | Admitting: Cardiology

## 2019-11-06 ENCOUNTER — Encounter (HOSPITAL_COMMUNITY): Payer: Self-pay | Admitting: Cardiology

## 2019-11-06 ENCOUNTER — Other Ambulatory Visit: Payer: Self-pay

## 2019-11-06 VITALS — BP 120/62 | HR 80 | Wt 185.0 lb

## 2019-11-06 DIAGNOSIS — I5022 Chronic systolic (congestive) heart failure: Secondary | ICD-10-CM | POA: Diagnosis not present

## 2019-11-06 DIAGNOSIS — E785 Hyperlipidemia, unspecified: Secondary | ICD-10-CM | POA: Insufficient documentation

## 2019-11-06 DIAGNOSIS — N138 Other obstructive and reflux uropathy: Secondary | ICD-10-CM | POA: Insufficient documentation

## 2019-11-06 DIAGNOSIS — N401 Enlarged prostate with lower urinary tract symptoms: Secondary | ICD-10-CM | POA: Diagnosis not present

## 2019-11-06 DIAGNOSIS — E1122 Type 2 diabetes mellitus with diabetic chronic kidney disease: Secondary | ICD-10-CM | POA: Diagnosis not present

## 2019-11-06 DIAGNOSIS — I480 Paroxysmal atrial fibrillation: Secondary | ICD-10-CM | POA: Diagnosis not present

## 2019-11-06 DIAGNOSIS — I4891 Unspecified atrial fibrillation: Secondary | ICD-10-CM

## 2019-11-06 DIAGNOSIS — Z7901 Long term (current) use of anticoagulants: Secondary | ICD-10-CM | POA: Insufficient documentation

## 2019-11-06 DIAGNOSIS — N183 Chronic kidney disease, stage 3 unspecified: Secondary | ICD-10-CM | POA: Insufficient documentation

## 2019-11-06 DIAGNOSIS — Z79899 Other long term (current) drug therapy: Secondary | ICD-10-CM | POA: Diagnosis not present

## 2019-11-06 DIAGNOSIS — I130 Hypertensive heart and chronic kidney disease with heart failure and stage 1 through stage 4 chronic kidney disease, or unspecified chronic kidney disease: Secondary | ICD-10-CM | POA: Insufficient documentation

## 2019-11-06 DIAGNOSIS — R338 Other retention of urine: Secondary | ICD-10-CM | POA: Insufficient documentation

## 2019-11-06 DIAGNOSIS — I428 Other cardiomyopathies: Secondary | ICD-10-CM | POA: Diagnosis not present

## 2019-11-06 LAB — COMPREHENSIVE METABOLIC PANEL
ALT: 24 U/L (ref 0–44)
AST: 23 U/L (ref 15–41)
Albumin: 4.1 g/dL (ref 3.5–5.0)
Alkaline Phosphatase: 46 U/L (ref 38–126)
Anion gap: 13 (ref 5–15)
BUN: 36 mg/dL — ABNORMAL HIGH (ref 8–23)
CO2: 25 mmol/L (ref 22–32)
Calcium: 9.5 mg/dL (ref 8.9–10.3)
Chloride: 99 mmol/L (ref 98–111)
Creatinine, Ser: 1.62 mg/dL — ABNORMAL HIGH (ref 0.61–1.24)
GFR calc Af Amer: 46 mL/min — ABNORMAL LOW (ref >60)
GFR calc non Af Amer: 39 mL/min — ABNORMAL LOW (ref >60)
Glucose, Bld: 188 mg/dL — ABNORMAL HIGH (ref 70–99)
Potassium: 4.6 mmol/L (ref 3.5–5.1)
Sodium: 137 mmol/L (ref 135–145)
Total Bilirubin: 1 mg/dL (ref 0.3–1.2)
Total Protein: 7.2 g/dL (ref 6.5–8.1)

## 2019-11-06 LAB — DIGOXIN LEVEL: Digoxin Level: 0.8 ng/mL (ref 0.8–2.0)

## 2019-11-06 LAB — TSH: TSH: 5.278 u[IU]/mL — ABNORMAL HIGH (ref 0.350–4.500)

## 2019-11-06 LAB — T4, FREE: Free T4: 0.85 ng/dL (ref 0.61–1.12)

## 2019-11-06 MED ORDER — CARVEDILOL 3.125 MG PO TABS
3.1250 mg | ORAL_TABLET | Freq: Two times a day (BID) | ORAL | 3 refills | Status: DC
Start: 2019-11-06 — End: 2019-12-13

## 2019-11-06 NOTE — Patient Instructions (Addendum)
START Coreg 3.125mg  (1 tab) twice a day    You have been referred to Dr Curt Bears for afib ablation evaluation. His office will call you to schedule this appointment.    You have been ordered for an MRI of your heart. You will get a call to schedule this appointment.    Labs today We will only contact you if something comes back abnormal or we need to make some changes. Otherwise no news is good news!   Your physician recommends that you schedule a follow-up appointment in: 3 weeks with the Pharmacy and 2 months with Dr Aundra Dubin   Please call office at [phone] option 2 if you have any questions or concerns.     At the [facility], you and your health needs are our priority. As part of our continuing mission to provide you with exceptional heart care, we have created designated Provider Care Teams. These Care Teams include your primary Cardiologist (physician) and Advanced Practice Providers (APPs- Physician Assistants and Nurse Practitioners) who all work together to provide you with the care you need, when you need it.   You may see any of the following providers on your designated Care Team at your next follow up: [name] Dr Glori Bickers . Dr Loralie Champagne . Darrick Grinder, NP . Lyda Jester, PA . Audry Riles, PharmD   Please be sure to bring in all your medications bottles to every appointment.

## 2019-11-06 NOTE — Progress Notes (Signed)
t  Advanced Heart Failure Clinic Note    PCP: [name], MD PCP-Cardiologist: Kate Sable, MD  AHF: Dr. Aundra Dubin  Urology: Dr Karsten Ro   HPI: 79 y.o. male w/prior history of HTN and hyperlipidemia, who initially reported to [facility] w/ complaints of about 1 month of cough and dyspnea with exertion as well as progressive edema. No chest pain. He saw his PCPprior to coming to the [facility] 08/01/19 and was noted to be in atrial fibrillation with RVR and he was then sent to [facility] for admission. Hs-TnI was flat at 25 =>27. BNP elevated, 1,068.COVID negative.He was initially tried on diltiazem gtt but became hypotensiveand changed toamiodarone gtt. Echo was done, showing EF <20% with diffuse hypokinesis, severely decreased RV function.He was transferred to [facility] on 2/18for HF work up and cath.   On arrival to [facility], he was found to be volume overloaded w/ soft SBPs and rising SCr up to 1.9 with attempts at diuresis, concerning for low output. PICC line was placed to measure co-ox and CVP. Initial co-ox low at 46%. He was started on milrinone and IV Lasix. IV amiodarone continued for afib + IV heparin.RHC/LHC 08/04/19 with no coronary disease, mildly elevated filling pressures.Co-ox improved w/ milrinone. He was ultimately weaned off milrinone and co-ox remained stable. He diuresed well w/ IV Lasix and later changed to torsemide. SCr improved w/ diuresis, down to 1.32.   He was set up for TEE/DCCV on 08/07/19 and found to have LAA thrombus on TEE. DCCV canceled. IV amiodarone was discontinued and he was changed to PO, 200 mg bid. Placed on Eliquis 5 bid.ASA discontinued.Plan to repeat TEE/DCCV after 1 month of chronic anticoagulation. He was advised to continue amiodarone 200 mg bid x 7 days, followed by 200 mg daily.   Also of note, he develop urinary retention due to BPH. Had over 500cc on bladder scan.Multiple attempts were made by the nursing staff to place a foley which were  unsuccessful. Urology was consulted and placed16 french council foley catheter. Subsequently, he had mild hematuria which ultimately cleared. Urology recommended foley to continue on discharge with plans to be followed closely in outpatient clinic. He was discharged from the hospital 07/2419.   He had f/u w/ urology 08/15/19 and foley removed. After returning home, he redeveloped urinary retention and went to ED next day. Foley cath reinserted.   S/P DC-CV 09/08/19 with restoration of NSR.   Patient returns today for followup of CHF.  He is doing quite well symptomatically.  He is in NSR today.  No significant exertional dyspnea.  No palpitations.  No chest pain.  No orthopnea, PND.  Weight is up 2 lbs.  He still has a foley catheter, plan for TURP on 6/21.     Labs (3/21): K 4.7, creatinine 1.6, TSH elevated, free T3/free T4 normal, LFTs normal.   ECG (personally reviewed): NSR, PVCs, anterolateral TWIs  PMH: 1. Atrial fibrillation: Paroxysmal.   - DCCV to NSR 3/21.  2. BPH with urinary retention 3. Type 2 diabetes 4. HTN 5. Nephrolithiasis 6. CKD stage 3 7. Chronic systolic CHF: Nonischemic cardiomyopathy.  - Echo (2/21): EF < 20%, with severely decreased RV systolic function. - LHC (2/21): No significant CAD   Current Outpatient Medications  Medication Sig Dispense Refill  . amiodarone (PACERONE) 200 MG tablet Take 200 mg by mouth at bedtime.    [name] apixaban (ELIQUIS) 5 MG TABS tablet Take 1 tablet (5 mg total) by mouth 2 (two) times daily. 180 tablet  3  . atorvastatin (LIPITOR) 10 MG tablet Take 10 mg by mouth at bedtime.     . digoxin (LANOXIN) 0.125 MG tablet Take 0.5 tablets (0.0625 mg total) by mouth daily. 45 tablet 3  . Multiple Vitamin (MULTIVITAMIN) tablet Take 1 tablet by mouth daily.    . sacubitril-valsartan (ENTRESTO) 24-26 MG Take 1 tablet by mouth 2 (two) times daily. 180 tablet 3  . spironolactone (ALDACTONE) 25 MG tablet Take 25 mg by mouth daily.    [name] torsemide  (DEMADEX) 20 MG tablet Take 1 tablet (20 mg total) by mouth daily. 90 tablet 3  . traZODone (DESYREL) 50 MG tablet Take 50 mg by mouth at bedtime.    . carvedilol (COREG) 3.125 MG tablet Take 1 tablet (3.125 mg total) by mouth 2 (two) times daily. 60 tablet 3   No current facility-administered medications for this encounter.    No Known Allergies    Social History   Socioeconomic History  . Marital status: Divorced    Spouse name: Not on file  . Number of children: Not on file  . Years of education: Not on file  . Highest education level: Not on file  Occupational History  . Not on file  Tobacco Use  . Smoking status: Never Smoker  . Smokeless tobacco: Current User    Types: Chew  . Tobacco comment: rarely chews tobacco cigar  Substance and Sexual Activity  . Alcohol use: Not Currently    Alcohol/week: 0.0 standard drinks    Comment: pt says drank "pretty good" up until 2 or 3 months ago.  . Drug use: No  . Sexual activity: Not on file  Other Topics Concern  . Not on file  Social History Narrative  . Not on file   Social Determinants of Health   Financial Resource Strain:   . Difficulty of Paying Living Expenses:   Food Insecurity:   . Worried About Charity fundraiser in the Last Year:   . Arboriculturist in the Last Year:   Transportation Needs:   . Film/video editor (Medical):   [name] Lack of Transportation (Non-Medical):   Physical Activity:   . Days of Exercise per Week:   . Minutes of Exercise per Session:   Stress:   . Feeling of Stress :   Social Connections:   . Frequency of Communication with Friends and Family:   . Frequency of Social Gatherings with Friends and Family:   . Attends Religious Services:   . Active Member of Clubs or Organizations:   . Attends Archivist Meetings:   [name] Marital Status:   Intimate Partner Violence:   . Fear of Current or Ex-Partner:   . Emotionally Abused:   [name] Physically Abused:   . Sexually Abused:        Family History  Problem Relation Age of Onset  . Colon cancer Neg Hx     Vitals:   11/06/19 1050  BP: 120/62  Pulse: 80  SpO2: 98%  Weight: 83.9 kg (185 lb)   Wt Readings from Last 3 Encounters:  11/06/19 83.9 kg (185 lb)  10/25/19 81.6 kg (180 lb)  09/21/19 83.4 kg (183 lb 12.8 oz)     PHYSICAL EXAM: General: NAD Neck: No JVD, no thyromegaly or thyroid nodule.  Lungs: Clear to auscultation bilaterally with normal respiratory effort. CV: Nondisplaced PMI.  Heart regular S1/S2, no S3/S4, no murmur.  No peripheral edema.  No carotid bruit.  Normal pedal  pulses.  Abdomen: Soft, nontender, no hepatosplenomegaly, no distention.  Skin: Intact without lesions or rashes.  Neurologic: Alert and oriented x 3.  Psych: Normal affect. Extremities: No clubbing or cyanosis.  HEENT: Normal.    ASSESSMENT & PLAN:  1. Chronic systolic CHF: Echo 2/37 with EF <20%, severely decreased RV function. Nonischemic cardiomyopathy, ?tachycardia-mediated due to atrial fibrillation of uncertain duration. No prior echo or history of CHF.He required milrinone during 2/21 admission but was titrated off.  Coronary angiography in 2/21 with no significant coronary disease. NYHA class II symptoms, NYHA class II symptoms.  - Start Coreg 3.125 mg bid.  - Continue torsemide 20 mg daily, BMET today.  - Continue digoxin 0.0625 daily, check level today.  - Continue spironolactone 25 mg daily.  - Continue Entresto 24-26 mg twice a day.  - I will arrange for cardiac MRI to reassess LV function and look for any evidence for infiltrative disease.  2. Atrial fibrillation: Paroxysmal.  Admitted with afib/RVR of uncertain duration 6/28. Unsure if atrial fibrillation/RVR was cause of CMP (tachy-mediated) or an effect of the cardiomyopathy.  TEE on 2/22 showed LA appendage thrombus. He was anticoagulated, then underwent DC-CV on 09/08/19 with conversion to NSR. He is in NSR today.  - Continue amiodarone, check  LFTs/TSH/free T3/free T4 today.  He will need regular eye exam while on amiodarone.   - Continue apixaban  - I will refer to Dr. Rayann Heman or [facility] for atrial fibrillation ablation.  Hopefully this will allow [place] to stop amiodarone in the future.  3. CKD stage 3:  - Check BMET today.  4. BPH/urinary obstruction: Still has foley catheter.  Plan for TURP on 6/21 with Dr. Karsten Ro.   - Ok to hold Eliquis 2 days prior to surgery, restart afterwards (would avoid holding longer due to h/o LA appendage thrombus).   Followup in 3 wks with clinical pharmacist for medication titration, then see me in 2 months.   Loralie Champagne, MD 11/06/19

## 2019-11-07 LAB — T3, FREE: T3, Free: 2.4 pg/mL (ref 2.0–4.4)

## 2019-11-08 NOTE — Patient Instructions (Addendum)
DUE TO COVID-19 ONLY ONE VISITOR IS ALLOWED TO COME WITH YOU AND STAY IN THE WAITING ROOM ONLY DURING PRE OP AND PROCEDURE DAY OF SURGERY. THE 1 VISITOR MAY VISIT WITH YOU AFTER SURGERY IN YOUR PRIVATE ROOM DURING VISITING HOURS ONLY!  YOU NEED TO HAVE A COVID 19 TEST ON 11-23-19 @ 9:05 AM, THIS TEST MUST BE DONE BEFORE SURGERY, COME  [address], [place] , [id number].  ([place]) ONCE YOUR COVID TEST IS COMPLETED, PLEASE BEGIN THE QUARANTINE INSTRUCTIONS AS OUTLINED IN YOUR HANDOUT.                Chris Guerrero  11/08/2019   Your procedure is scheduled on: 11-27-19   Report to [facility] Main  Entrance    Report to Admitting at 5:30 AM     Call this number if you have problems the morning of surgery [phone]    Remember: Do not eat food or drink liquids :After Midnight. .     Take these medicines the morning of surgery with A SIP OF WATER: Carvedilol (Coreg), and Digoxin (Lanoxin)  BRUSH YOUR TEETH MORNING OF SURGERY AND RINSE YOUR MOUTH OUT, NO CHEWING GUM CANDY OR MINTS                                 You may not have any metal on your body including hair pins and              piercings     Do not wear jewelry, cologne, lotions, powders or deodorant                         Men may shave face and neck.   Do not bring valuables to the hospital. [place].  Contacts, dentures or bridgework may not be worn into surgery.      Patients discharged the day of surgery will not be allowed to drive home. IF YOU ARE HAVING SURGERY AND GOING HOME THE SAME DAY, YOU MUST HAVE AN ADULT TO DRIVE YOU HOME AND BE WITH YOU FOR 24 HOURS. YOU MAY GO HOME BY TAXI OR UBER OR ORTHERWISE, BUT AN ADULT MUST ACCOMPANY YOU HOME AND STAY WITH YOU FOR 24 HOURS.  Name and phone number of your driver:Chris Guerrero (Pt to provide on day of procedure)  Special Instructions: N/A              Please read over the following  fact sheets you were given: _____________________________________________________________________             [facility] - Preparing for Surgery Before surgery, you can play an important role.  Because skin is not sterile, your skin needs to be as free of germs as possible.  You can reduce the number of germs on your skin by washing with CHG (chlorahexidine gluconate) soap before surgery.  CHG is an antiseptic cleaner which kills germs and bonds with the skin to continue killing germs even after washing. Please DO NOT use if you have an allergy to CHG or antibacterial soaps.  If your skin becomes reddened/irritated stop using the CHG and inform your nurse when you arrive at Short Stay. Do not shave (including legs and underarms) for at least 48 hours prior to  the first CHG shower.  You may shave your face/neck. Please follow these instructions carefully:  1.  Shower with CHG Soap the night before surgery and the  morning of Surgery.  2.  If you choose to wash your hair, wash your hair first as usual with your  normal  shampoo.  3.  After you shampoo, rinse your hair and body thoroughly to remove the  shampoo.                           4.  Use CHG as you would any other liquid soap.  You can apply chg directly  to the skin and wash                       Gently with a scrungie or clean washcloth.  5.  Apply the CHG Soap to your body ONLY FROM THE NECK DOWN.   Do not use on face/ open                           Wound or open sores. Avoid contact with eyes, ears mouth and genitals (private parts).                       Wash face,  Genitals (private parts) with your normal soap.             6.  Wash thoroughly, paying special attention to the area where your surgery  will be performed.  7.  Thoroughly rinse your body with warm water from the neck down.  8.  DO NOT shower/wash with your normal soap after using and rinsing off  the CHG Soap.                9.  Pat yourself dry with a clean towel.             10.  Wear clean pajamas.            11.  Place clean sheets on your bed the night of your first shower and do not  sleep with pets. Day of Surgery : Do not apply any lotions/deodorants the morning of surgery.  Please wear clean clothes to the hospital/surgery center.  FAILURE TO FOLLOW THESE INSTRUCTIONS MAY RESULT IN THE CANCELLATION OF YOUR SURGERY PATIENT SIGNATURE_________________________________  NURSE SIGNATURE__________________________________  ________________________________________________________________________

## 2019-11-08 NOTE — Progress Notes (Addendum)
PCP - [name], MD  Cardiologist - Herminio Commons, MD....Dr. Aundra Dubin w/ cardiac clearance dated 11-06-19 in EPIC  Chest x-ray - 08-03-19 EKG - 11-06-19 Stress Test -  ECHO - 09-08-19 Cardiac Cath -   Sleep Study -  CPAP -   Fasting Blood Sugar -  Checks Blood Sugar _____ times a day  Blood Thinner Instructions: Eliquis  Hold x 2 days Aspirin Instructions: Last Dose: Pt plan to hold on 11-23-19  Pt has had COVID vaccinations x 2 dose in March 21  Anesthesia review:   Patient denies shortness of breath, fever, cough and chest pain at PAT appointment   Patient verbalized understanding of instructions that were given to them at the PAT appointment. Patient was also instructed that they will need to review over the PAT instructions again at home before surgery.

## 2019-11-15 ENCOUNTER — Encounter (HOSPITAL_COMMUNITY): Payer: Self-pay

## 2019-11-15 ENCOUNTER — Encounter (HOSPITAL_COMMUNITY)
Admission: RE | Admit: 2019-11-15 | Discharge: 2019-11-15 | Disposition: A | Discharge status: Home / Self Care | Payer: Medicare Other | Source: Ambulatory Visit | Attending: Urology | Admitting: Urology

## 2019-11-15 ENCOUNTER — Other Ambulatory Visit: Payer: Self-pay

## 2019-11-21 ENCOUNTER — Other Ambulatory Visit: Payer: Self-pay

## 2019-11-21 ENCOUNTER — Encounter (HOSPITAL_COMMUNITY)
Admission: RE | Admit: 2019-11-21 | Discharge: 2019-11-21 | Disposition: A | Discharge status: Home / Self Care | Payer: Medicare Other | Source: Ambulatory Visit | Attending: Urology | Admitting: Urology

## 2019-11-21 DIAGNOSIS — R339 Retention of urine, unspecified: Secondary | ICD-10-CM | POA: Diagnosis not present

## 2019-11-21 DIAGNOSIS — Z7901 Long term (current) use of anticoagulants: Secondary | ICD-10-CM | POA: Insufficient documentation

## 2019-11-21 DIAGNOSIS — I428 Other cardiomyopathies: Secondary | ICD-10-CM | POA: Insufficient documentation

## 2019-11-21 DIAGNOSIS — N183 Chronic kidney disease, stage 3 unspecified: Secondary | ICD-10-CM | POA: Diagnosis not present

## 2019-11-21 DIAGNOSIS — Z01812 Encounter for preprocedural laboratory examination: Secondary | ICD-10-CM | POA: Diagnosis not present

## 2019-11-21 DIAGNOSIS — I480 Paroxysmal atrial fibrillation: Secondary | ICD-10-CM | POA: Insufficient documentation

## 2019-11-21 DIAGNOSIS — I130 Hypertensive heart and chronic kidney disease with heart failure and stage 1 through stage 4 chronic kidney disease, or unspecified chronic kidney disease: Secondary | ICD-10-CM | POA: Insufficient documentation

## 2019-11-21 DIAGNOSIS — Z888 Allergy status to other drugs, medicaments and biological substances status: Secondary | ICD-10-CM | POA: Diagnosis not present

## 2019-11-21 DIAGNOSIS — Z79899 Other long term (current) drug therapy: Secondary | ICD-10-CM | POA: Insufficient documentation

## 2019-11-21 DIAGNOSIS — E785 Hyperlipidemia, unspecified: Secondary | ICD-10-CM | POA: Insufficient documentation

## 2019-11-21 DIAGNOSIS — F17210 Nicotine dependence, cigarettes, uncomplicated: Secondary | ICD-10-CM | POA: Insufficient documentation

## 2019-11-21 DIAGNOSIS — N401 Enlarged prostate with lower urinary tract symptoms: Secondary | ICD-10-CM | POA: Diagnosis present

## 2019-11-21 DIAGNOSIS — I509 Heart failure, unspecified: Secondary | ICD-10-CM | POA: Diagnosis not present

## 2019-11-21 DIAGNOSIS — N138 Other obstructive and reflux uropathy: Secondary | ICD-10-CM | POA: Diagnosis present

## 2019-11-21 DIAGNOSIS — I110 Hypertensive heart disease with heart failure: Secondary | ICD-10-CM | POA: Diagnosis not present

## 2019-11-21 DIAGNOSIS — D291 Benign neoplasm of prostate: Secondary | ICD-10-CM | POA: Insufficient documentation

## 2019-11-21 DIAGNOSIS — F17200 Nicotine dependence, unspecified, uncomplicated: Secondary | ICD-10-CM | POA: Diagnosis not present

## 2019-11-21 DIAGNOSIS — E119 Type 2 diabetes mellitus without complications: Secondary | ICD-10-CM | POA: Diagnosis not present

## 2019-11-21 DIAGNOSIS — R338 Other retention of urine: Secondary | ICD-10-CM | POA: Diagnosis not present

## 2019-11-21 LAB — BASIC METABOLIC PANEL
Anion gap: 12 (ref 5–15)
BUN: 43 mg/dL — ABNORMAL HIGH (ref 8–23)
CO2: 25 mmol/L (ref 22–32)
Calcium: 9.3 mg/dL (ref 8.9–10.3)
Chloride: 97 mmol/L — ABNORMAL LOW (ref 98–111)
Creatinine, Ser: 1.77 mg/dL — ABNORMAL HIGH (ref 0.61–1.24)
GFR calc Af Amer: 41 mL/min — ABNORMAL LOW (ref >60)
GFR calc non Af Amer: 35 mL/min — ABNORMAL LOW (ref >60)
Glucose, Bld: 232 mg/dL — ABNORMAL HIGH (ref 70–99)
Potassium: 4.9 mmol/L (ref 3.5–5.1)
Sodium: 134 mmol/L — ABNORMAL LOW (ref 135–145)

## 2019-11-21 LAB — CBC
HCT: 39.5 % (ref 39.0–52.0)
Hemoglobin: 13.3 g/dL (ref 13.0–17.0)
MCH: 32.1 pg (ref 26.0–34.0)
MCHC: 33.7 g/dL (ref 30.0–36.0)
MCV: 95.4 fL (ref 80.0–100.0)
Platelets: 246 10*3/uL (ref 150–400)
RBC: 4.14 MIL/uL — ABNORMAL LOW (ref 4.22–5.81)
RDW: 13.2 % (ref 11.5–15.5)
WBC: 8 10*3/uL (ref 4.0–10.5)
nRBC: 0 % (ref 0.0–0.2)

## 2019-11-21 LAB — HEMOGLOBIN A1C
Hgb A1c MFr Bld: 7.5 % — ABNORMAL HIGH (ref 4.8–5.6)
Mean Plasma Glucose: 168.55 mg/dL

## 2019-11-23 ENCOUNTER — Other Ambulatory Visit (HOSPITAL_COMMUNITY)
Admission: RE | Admit: 2019-11-23 | Discharge: 2019-11-23 | Disposition: A | Discharge status: Home / Self Care | Payer: Medicare Other | Source: Ambulatory Visit | Attending: Urology | Admitting: Urology

## 2019-11-23 ENCOUNTER — Other Ambulatory Visit (HOSPITAL_COMMUNITY): Payer: No Typology Code available for payment source

## 2019-11-23 DIAGNOSIS — Z01812 Encounter for preprocedural laboratory examination: Secondary | ICD-10-CM | POA: Insufficient documentation

## 2019-11-23 DIAGNOSIS — Z20822 Contact with and (suspected) exposure to covid-19: Secondary | ICD-10-CM | POA: Diagnosis not present

## 2019-11-23 LAB — SARS CORONAVIRUS 2 (TAT 6-24 HRS): SARS Coronavirus 2: NEGATIVE

## 2019-11-23 NOTE — Progress Notes (Signed)
Anesthesia Chart Review   Case: [id number] Date/Time: 11/27/19 0715   Procedure: TRANSURETHRAL RESECTION OF THE PROSTATE (TURP) (N/A )   Anesthesia type: General   Pre-op diagnosis: BENIGN PROSTATIC HYPERPLASIA WITH URINARY RETENTION   Location: [facility] PROCEDURE ROOM / [facility]   Surgeons: Kathie Rhodes, MD      DISCUSSION:79 y.o. light tobacco smoker with h/o HTN, PAF s/p DCCV 3/21 (on Eliquis), CHF (Echo 09/08/19: EF 25-30%), CKD Stage III (creatinine stable), DM II, BPH with urinary retention scheduled for above procedure 11/27/2019 with Dr. Kathie Rhodes.   Pt last seen by cardiology 11/06/2019.  Dr. Aundra Dubin aware of upcoming procedure.  Per OV note, "He is doing quite well symptomatically.  He is in NSR today.  No significant exertional dyspnea.  No palpitations.  No chest pain.  No orthopnea, PND.  Weight is up 2 lbs.  He still has a foley catheter, plan for TURP on 6/21.  Ok to hold Eliquis 2 days prior to surgery, restart afterwards (would avoid holding longer due to h/o LA appendage thrombus)."  Anticipate pt can proceed with planned procedure barring acute status change.   VS: BP (!) 104/55    Pulse 60    Temp 36.7 C (Oral)    Resp 18    Ht 5\' 9"  (1.753 m)    Wt 85.3 kg    SpO2 98%    BMI 27.77 kg/m   PROVIDERS: [name], MD is PCP   Loralie Champagne, MD is Cardiologist  LABS: Labs reviewed: Acceptable for surgery. (all labs ordered are listed, but only abnormal results are displayed)  Labs Reviewed  BASIC METABOLIC PANEL - Abnormal; Notable for the following components:      Result Value   Sodium 134 (*)    Chloride 97 (*)    Glucose, Bld 232 (*)    BUN 43 (*)    Creatinine, Ser 1.77 (*)    GFR calc non Af Amer 35 (*)    GFR calc Af Amer 41 (*)    All other components within normal limits  CBC - Abnormal; Notable for the following components:   RBC 4.14 (*)    All other components within normal limits     IMAGES:   EKG: 11/06/2019 Rate 79 bpm Sinus rhythm with frequent  Premature ventricular complexes in a pattern of trigeminy T wave abnormality, consider inferolateral ischemia Abnormal ECG No significant change since last tracing  CV: Echo 09/08/2019 IMPRESSIONS    1. Left ventricular ejection fraction, by estimation, is 25 to 30%. The  left ventricle has severely decreased function. The left ventricle  demonstrates global hypokinesis. The left ventricular internal cavity size  was mildly dilated.  2. Right ventricular systolic function is normal. The right ventricular  size is mildly enlarged. There is normal pulmonary artery systolic  pressure.  3. There was a small PFO by color doppler. Left atrial size was  moderately dilated. No left atrial/left atrial appendage thrombus was  detected.  4. Right atrial size was moderately dilated.  5. The mitral valve is normal in structure. Trivial mitral valve  regurgitation. No evidence of mitral stenosis.  6. The aortic valve is tricuspid. Aortic valve regurgitation is not  visualized. No aortic stenosis is present.  7. Normal caliber thoracic aorta with grade 3 plaque in the descending  thoracic aorta.   Cardiac Cath 08/04/2019 1. Mildly elevated filling pressures.   2. Excellent cardiac output on milrinone.  3. No significant coronary disease.   Nonischemic  cardiomyopathy.   Past Medical History:  Diagnosis Date   Atrial fibrillation ([place])    BPH with obstruction/lower urinary tract symptoms    CHF (congestive heart failure) (HCC)    CKD (chronic kidney disease), stage III    Diabetes mellitus without complication ([place])    History of kidney stones    Hyperlipemia    Hypertension    Nonischemic cardiomyopathy [facility])     Past Surgical History:  Procedure Laterality Date   CARDIOVERSION N/A 09/08/2019   Procedure: CARDIOVERSION;  Surgeon: Larey Dresser, MD;  Location: [place];  Service: Cardiovascular;  Laterality: N/A;   CATARACT EXTRACTION W/ INTRAOCULAR LENS  IMPLANT     CHOLECYSTECTOMY     COLONOSCOPY  09/04/2010   RMR: diverticula, cecal polyp = tubular adenoma; Repeat in 5 years   COLONOSCOPY N/A 10/10/2015   Procedure: COLONOSCOPY;  Surgeon: Daneil Dolin, MD;  [facility];  Service: Endoscopy;  Laterality: N/A;  [place]     RIGHT/LEFT HEART CATH AND CORONARY ANGIOGRAPHY N/A 08/04/2019   Procedure: RIGHT/LEFT HEART CATH AND CORONARY ANGIOGRAPHY;  Surgeon: Larey Dresser, MD;  Location: [place] CV LAB;  Service: Cardiovascular;  Laterality: N/A;   TEE WITHOUT CARDIOVERSION N/A 08/07/2019   Procedure: TRANSESOPHAGEAL ECHOCARDIOGRAM (TEE);  Surgeon: Larey Dresser, MD;  Location: [facility] ENDOSCOPY;  Service: Cardiovascular;  Laterality: N/A;   TEE WITHOUT CARDIOVERSION N/A 09/08/2019   Procedure: TRANSESOPHAGEAL ECHOCARDIOGRAM (TEE);  Surgeon: Larey Dresser, MD;  Location: [facility] ENDOSCOPY;  Service: Cardiovascular;  Laterality: N/A;    MEDICATIONS:  amiodarone (PACERONE) 200 MG tablet   apixaban (ELIQUIS) 5 MG TABS tablet   atorvastatin (LIPITOR) 10 MG tablet   carvedilol (COREG) 3.125 MG tablet   digoxin (LANOXIN) 0.125 MG tablet   Multiple Vitamin (MULTIVITAMIN) tablet   sacubitril-valsartan (ENTRESTO) 24-26 MG   spironolactone (ALDACTONE) 25 MG tablet   torsemide (DEMADEX) 20 MG tablet   traZODone (DESYREL) 50 MG tablet   No current facility-administered medications for this encounter.    Maia Plan [facility] Pre-Surgical Testing [phone] 11/23/19  12:12 PM

## 2019-11-23 NOTE — Anesthesia Preprocedure Evaluation (Addendum)
Anesthesia Evaluation  Patient identified by MRN, date of birth, ID band Patient awake    Reviewed: Allergy & Precautions, NPO status , Patient's Chart, lab work & pertinent test results, reviewed documented beta blocker date and time   Airway Mallampati: II       Dental  (+) Teeth Intact, Poor Dentition   Pulmonary Current Smoker and Patient abstained from smoking.,    Pulmonary exam normal breath sounds clear to auscultation       Cardiovascular hypertension, Pt. on medications and Pt. on home beta blockers +CHF  Normal cardiovascular exam Rhythm:Regular Rate:Normal     Neuro/Psych negative neurological ROS  negative psych ROS   GI/Hepatic negative GI ROS, Neg liver ROS,   Endo/Other  diabetes  Renal/GU Renal diseaseCKD III     Musculoskeletal   Abdominal Normal abdominal exam  (+)   Peds  Hematology   Anesthesia Other Findings After discussion of the risks and benefits of a TEE, an  informed consent was obtained from the patient. The transesophogeal probe  was passed without difficulty through the esophogus of the patient.  Sedation performed by different physician.  The patient was monitored while under deep sedation. Anesthestetic  sedation was provided intravenously by Anesthesiology: 170mg  of Propofol.  Image quality was adequate. The patient developed no complications during  the procedure. A successful direct  current cardioversion was performed at 200 joules with 1 attempt.   IMPRESSIONS    1. Left ventricular ejection fraction, by estimation, is 25 to 30%. The  left ventricle has severely decreased function. The left ventricle  demonstrates global hypokinesis. The left ventricular internal cavity size  was mildly dilated.  2. Right ventricular systolic function is normal. The right ventricular  size is mildly enlarged. There is normal pulmonary artery systolic  pressure.  3. There was a small  PFO by color doppler. Left atrial size was  moderately dilated. No left atrial/left atrial appendage thrombus was  detected.  4. Right atrial size was moderately dilated.  5. The mitral valve is normal in structure. Trivial mitral valve  regurgitation. No evidence of mitral stenosis.  6. The aortic valve is tricuspid. Aortic valve regurgitation is not  visualized. No aortic stenosis is present.  7. Normal caliber thoracic aorta with grade 3 plaque in the descending  thoracic aorta.   FINDINGS   Reproductive/Obstetrics                            Anesthesia Physical Anesthesia Plan  ASA: III  Anesthesia Plan: General   Post-op Pain Management:    Induction: Intravenous  PONV Risk Score and Plan: 2 and Dexamethasone  Airway Management Planned: LMA  Additional Equipment: None  Intra-op Plan:   Post-operative Plan:   Informed Consent: I have reviewed the patients History and Physical, chart, labs and discussed the procedure including the risks, benefits and alternatives for the proposed anesthesia with the patient or authorized representative who has indicated his/her understanding and acceptance.     Dental advisory given  Plan Discussed with: CRNA  Anesthesia Plan Comments: (79 y.o. light tobacco smoker with h/o HTN, PAF s/p DCCV 3/21 (on Eliquis), CHF (Echo 09/08/19: EF 25-30%), CKD Stage III (creatinine stable), DM II, BPH with urinary retention scheduled for above procedure 11/27/2019 with Dr. Kathie Rhodes.   Pt last seen by cardiology 11/06/2019.  Dr. Aundra Dubin aware of upcoming procedure.  Per OV note, "He is doing quite well symptomatically.  He  is in NSR today.  No significant exertional dyspnea.  No palpitations.  No chest pain.  No orthopnea, PND.  Weight is up 2 lbs.  He still has a foley catheter, plan for TURP on 6/21.  Ok to hold Eliquis 2 days prior to surgery, restart afterwards (would avoid holding longer due to h/o LA appendage  thrombus)." )      Anesthesia Quick Evaluation

## 2019-11-25 NOTE — H&P (Signed)
CC/HPI: 08/15/2019: Patient with past history of BPH with only mild to moderate obstructive voiding symptoms previously taking doxazosin was recently admitted for exacerbation of CHF and started on Lasix subsequently developing increased difficulty voiding. Patient had approximately 500 cc on bladder scan with multiple nursing staff attempted to place a Foley catheter. Urology was consulted.  Cystoscopy performed noted an enlarged prostate with bilobar hyperplasia and a false passage at 6:00 a.m. as well as an elevated bladder neck. Retroflexion showed 3 cm intravesical prostatic protrusion. Sensor wire placed and 16 [place] [employer] tip catheter was placed over the wire draining approximately 700 cc of dark urine.   He presented to the ED on 02/28 with increasing lower abdominal and suprapubic discomfort. He was complaining of blood leaking around Catheter tubing as well. ER staff attempted irrigate the catheter but bleeding continued. Foley catheter was replaced and noted to be draining much clear with patient relief after replacement. Urine culture assessed at that time was negative for any infectious process.   During his hospitalization before recent ED visit, A TEE revealed a LAA thrombus. Pt was started on anticoagulation therapy with plan to repeat procedure in approximately 1 month after ongoing anticoagulation therapy with Eliquis. Pt also on Amiodarone as well.   08/22/19: The patient underwent a successful voiding trial on 08/15/19. 24 hours later presented emergency room with severe SP discomfort and recurrent retention. He was seen by Dr.Borden and a 20 [place] coude catheter was placed without difficulty. 500 cc of urine returned.   09/19/19: He underwent elective cardioversion on 09/08/19 with conversion to normal sinus rhythm. He came in with his son today and said he is doing well. He has remained in sinus rhythm but also remains on Eliquis until he sees his cardiologist. No new complaints are  noted.   11/06/19: PAtient with above noted past medical history. He presents today for a pre operative urine culture and catheter change. He has no new complaints. He has been cleared for surgery by his cardiologist and will stop Eliquis 3 days prior to operation.     ALLERGIES: Finasteride TABS    MEDICATIONS: Aldactone  Amiodarone Hcl  Atorvastatin Calcium 10 mg tablet  Digoxin  Eliquis  Entresto  Multivitamin  Spironolactone  Torsemide  TraZODone HCl - 50 MG Oral Tablet Oral     GU PSH: Cystoscopy - 08/04/2019       PSH Notes: Nose Surgery   NON-GU PSH: None   GU PMH: Urinary Retention - 08/22/2019, - 08/15/2019 BPH w/o LUTS (Stable), Does have an enlarged prostate by exam but despite its large size he does not have any significant voiding symptoms. - 02/14/2019 Elevated PSA (Stable), His prostate was noted to be smooth and benign to examination with a PSA that has been within the normal range since he had a transient elevation 3 years ago. I will check a PSA today. If it remains normal further PSAs will not be necessary from this point on. Since he is not under any form of active management from a urologic standpoint he will return to see me as needed. - 02/14/2019, (Stable), With a benign prostate and a PSA remains normal I have recommended continued yearly DRE and PSA., - 2018 (Stable), His prostate is enlarged but it is benign to examination. His PSA has fallen once again down to 3.97. At this point my recommendation is continued DRE and PSA every 6 months., - 2017, Elevated prostate specific antigen (PSA), - 2015 ED due to arterial insufficiency (  Stable), We again discussed options for his erectile dysfunction. He has elected continue to get Viagra from the [place]. - 2018 BPH w/LUTS, Benign prostatic hyperplasia with urinary obstruction - 66 Male ED, unspecified, Erectile dysfunction - 2015 Urinary Retention, Unspec, Urinary retention - 2015 Gross hematuria, Gross hematuria -  2015 Renal calculus, Kidney stone on left side - 2014 Renal cyst, Renal cysts, acquired, bilateral - 2014      PMH Notes: History of gross hematuria: The experienced gross hematuria and was evaluated in 1/13 with a CT scan that revealed a single stone in the left kidney, bilateral renal cysts and an enlarged prostate but no other abnormality. Cystoscopically no abnormality of the bladder was noted. His prostatic urethra was noted to be elongated consistent with BPH.   Elevated PSA: He was found to have a PSA of 4.17 and underwent TRUS/BX in 12/06 which revealed an 81 cc prostate but no evidence of malignancy. His PSA fell back into the normal range where it has remained.  PSA 5/13 - 5.41/26%   Bilateral renal cysts: These were noted on CT scan in 1/13 and are of no clinical significance.   BPH with bladder outlet obstruction: This has been present for a number of years. This had been managed with doxazosin.   He developed urinary retention in 3/13. His doxazosin dose was increased to 8 mg and he was found to have a follow-up PVR of only 13.5 cc.  Current therapy: Tamsulosin 0.4 mg and doxazosin 4 mg   Organic erectile dysfunction. This has responded to Viagra. This lost its effectiveness and he then considered VED.  Current therapy: Generic Cialis 20 mg from [place].   Hypogonadism: He was found to have a serum testosterone of 195 in 5/96. He has been managed with intramuscular testosterone injections.     NON-GU PMH: Encounter for general adult medical examination without abnormal findings, Encounter for preventive health examination - 2015    FAMILY HISTORY: Father Deceased At Age50 ___ - Runs In Family Mother Deceased At Age 18 from diabetic complicati - Runs In Family No pertinent family history - Other   SOCIAL HISTORY: Marital Status: Single Preferred Language: English; Ethnicity: Not Hispanic Or Latino; Race: White     Notes: Marital History - Divorced, Occupation:, Never A  Smoker, Caffeine Use   REVIEW OF SYSTEMS:    GU Review Male:   Patient denies frequent urination, hard to postpone urination, burning/ pain with urination, get up at night to urinate, leakage of urine, stream starts and stops, trouble starting your stream, have to strain to urinate , erection problems, and penile pain.  Gastrointestinal (Upper):   Patient denies nausea, vomiting, and indigestion/ heartburn.  Gastrointestinal (Lower):   Patient denies diarrhea and constipation.  Constitutional:   Patient denies fever, night sweats, weight loss, and fatigue.  Skin:   Patient denies skin rash/ lesion and itching.  Eyes:   Patient denies blurred vision and double vision.  Ears/ Nose/ Throat:   Patient denies sore throat and sinus problems.  Hematologic/Lymphatic:   Patient denies swollen glands and easy bruising.  Cardiovascular:   Patient denies leg swelling and chest pains.  Respiratory:   Patient denies cough and shortness of breath.  Endocrine:   Patient denies excessive thirst.  Musculoskeletal:   Patient denies back pain and joint pain.  Neurological:   Patient denies headaches and dizziness.  Psychologic:   Patient denies depression and anxiety.   Notes: Pt has catheter in place. Denies problems, but  believes this appointment should be a pre-op appt prior to surgery with Dr. Karsten Ro on 6/14.     VITAL SIGNS:    Weight 180 lb / 81.65 kg  Height 69 in / 175.26 cm  BP 104/70 mmHg  Pulse 80 /min  Temperature 97.5 F / 36.3 C  BMI 26.6 kg/m   GU PHYSICAL EXAMINATION:    Penis: Penile foley catheter present. Circumcised, no foreskin warts, no cracks. No dorsal peyronie's plaques, no left corporal peyronie's plaques, no right corporal peyronie's plaques, no scarring, no shaft warts. No balanitis, no meatal stenosis.    MULTI-SYSTEM PHYSICAL EXAMINATION:    Constitutional: Well-nourished. No physical deformities. Normally developed. Good grooming.  Respiratory: No labored breathing, no  use of accessory muscles.   Cardiovascular: Normal temperature, normal extremity pulses, no swelling, no varicosities.  Lymphatic: No enlargement of neck, axillae, groin.  Skin: No paleness, no jaundice, no cyanosis. No lesion, no ulcer, no rash.  Neurologic / Psychiatric: Oriented to time, oriented to place, oriented to person. No depression, no anxiety, no agitation.  Gastrointestinal: No mass, no tenderness, no rigidity, non obese abdomen.  Musculoskeletal: Normal gait and station of head and neck.     Complexity of Data:  Source Of History:  Patient, Healthcare Provider, Family/Caregiver, Medical Record Summary  Records Review:   Previous Doctor Records, Previous Patient Records  Urine Test Review:   Urinalysis   11/11/16 07/21/16 02/29/16 07/16/15 07/24/14 03/01/14 02/28/14  PSA  Total PSA 2.63 ng/dl 3.55 ng/dl 3.97 ng/dl 6.30 ng/dl 2.8 ng/dl 3.97  3.97 ng/dl    05/06/05  Hormones  Testosterone, Total 1.89    ASSESSMENT/PLAN:     ICD-10 Details  1 GU:   BPH w/LUTS - N40.1 Chronic, Stable  2   Urinary Retention - R33.8 Chronic, Stable    He remains in urinary retention and asked me if he needed to continue to take his doxazosin. I told him that at this point with a Foley catheter in he could stop taking that medication.  He has a very large prostate that measured 80 cc at the time of his transrectal ultrasound in 12/06. Because of that I do not think he would be a good candidate for Urolift or Rezum. We therefore discussed transurethral resection of his prostate as the best means to render him catheter free. I went over the procedure with he and his son in detail. We discussed the procedure itself and how it was performed and its effect on the prostate. We went over the potential risks and complications, the probability of success and the need for an overnight stay in the hospital. We discussed the anticipated postoperative course.  I told him that right now his bladder is drained  with a catheter in therefore surgery can be postponed until it is safe for him to stop his Eliquis. He eventually received clearance from his cardiologist to safely stop the Eliquis for his surgery and this was done.  His preoperative urine culture grew multiple species.  I placed him on empiric preop Levaquin and also received cephalexin at the time of his last office visit. I placed him on cefpodoxime 200 mg BID pre-op as well.

## 2019-11-27 ENCOUNTER — Encounter (HOSPITAL_COMMUNITY)
Admission: RE | Disposition: A | Discharge status: Home / Self Care | Payer: Self-pay | Source: Other Acute Inpatient Hospital | Attending: Urology

## 2019-11-27 ENCOUNTER — Ambulatory Visit (HOSPITAL_COMMUNITY): Payer: Medicare Other | Admitting: Anesthesiology

## 2019-11-27 ENCOUNTER — Other Ambulatory Visit: Payer: Self-pay

## 2019-11-27 ENCOUNTER — Ambulatory Visit (HOSPITAL_COMMUNITY): Payer: Medicare Other | Admitting: Physician Assistant

## 2019-11-27 ENCOUNTER — Ambulatory Visit (HOSPITAL_COMMUNITY)
Admission: RE | Admit: 2019-11-27 | Discharge: 2019-11-28 | Disposition: A | Discharge status: Home / Self Care | Payer: Medicare Other | Source: Other Acute Inpatient Hospital | Attending: Urology | Admitting: Urology

## 2019-11-27 ENCOUNTER — Encounter (HOSPITAL_COMMUNITY): Payer: Self-pay | Admitting: Urology

## 2019-11-27 DIAGNOSIS — Z79899 Other long term (current) drug therapy: Secondary | ICD-10-CM | POA: Diagnosis not present

## 2019-11-27 DIAGNOSIS — Z7901 Long term (current) use of anticoagulants: Secondary | ICD-10-CM | POA: Diagnosis not present

## 2019-11-27 DIAGNOSIS — R338 Other retention of urine: Secondary | ICD-10-CM | POA: Insufficient documentation

## 2019-11-27 DIAGNOSIS — Z888 Allergy status to other drugs, medicaments and biological substances status: Secondary | ICD-10-CM | POA: Diagnosis not present

## 2019-11-27 DIAGNOSIS — I110 Hypertensive heart disease with heart failure: Secondary | ICD-10-CM | POA: Insufficient documentation

## 2019-11-27 DIAGNOSIS — F17200 Nicotine dependence, unspecified, uncomplicated: Secondary | ICD-10-CM | POA: Diagnosis not present

## 2019-11-27 DIAGNOSIS — E119 Type 2 diabetes mellitus without complications: Secondary | ICD-10-CM | POA: Insufficient documentation

## 2019-11-27 DIAGNOSIS — N401 Enlarged prostate with lower urinary tract symptoms: Secondary | ICD-10-CM | POA: Diagnosis not present

## 2019-11-27 DIAGNOSIS — I4891 Unspecified atrial fibrillation: Secondary | ICD-10-CM | POA: Diagnosis not present

## 2019-11-27 DIAGNOSIS — E785 Hyperlipidemia, unspecified: Secondary | ICD-10-CM | POA: Diagnosis not present

## 2019-11-27 DIAGNOSIS — I509 Heart failure, unspecified: Secondary | ICD-10-CM | POA: Insufficient documentation

## 2019-11-27 DIAGNOSIS — N138 Other obstructive and reflux uropathy: Secondary | ICD-10-CM | POA: Diagnosis present

## 2019-11-27 HISTORY — PX: TRANSURETHRAL RESECTION OF PROSTATE: SHX73

## 2019-11-27 LAB — GLUCOSE, CAPILLARY
Glucose-Capillary: 223 mg/dL — ABNORMAL HIGH (ref 70–99)
Glucose-Capillary: 348 mg/dL — ABNORMAL HIGH (ref 70–99)
Glucose-Capillary: 306 mg/dL — ABNORMAL HIGH (ref 70–99)
Glucose-Capillary: 255 mg/dL — ABNORMAL HIGH (ref 70–99)
Glucose-Capillary: 266 mg/dL — ABNORMAL HIGH (ref 70–99)

## 2019-11-27 SURGERY — TURP (TRANSURETHRAL RESECTION OF PROSTATE)
Anesthesia: General

## 2019-11-27 MED ORDER — AMIODARONE HCL 200 MG PO TABS
200.0000 mg | ORAL_TABLET | Freq: Every day | ORAL | Status: DC
  Administered 2019-11-27: 200 mg via ORAL
  Filled 2019-11-27: qty 1

## 2019-11-27 MED ORDER — PIPERACILLIN-TAZOBACTAM 3.375 G IVPB: 3.3750 g | Freq: Three times a day (TID) | INTRAVENOUS | Status: DC

## 2019-11-27 MED ORDER — DIGOXIN 0.0625 MG HALF TABLET
0.0625 mg | ORAL_TABLET | Freq: Every day | ORAL | Status: DC
  Filled 2019-11-27: qty 1

## 2019-11-27 MED ORDER — BELLADONNA ALKALOIDS-OPIUM 16.2-60 MG RE SUPP: 1.0000 | Freq: Four times a day (QID) | RECTAL | Status: DC | PRN

## 2019-11-27 MED ORDER — SODIUM CHLORIDE 0.9 % IV SOLN: INTRAVENOUS | Status: DC

## 2019-11-27 MED ORDER — FENTANYL CITRATE (PF) 100 MCG/2ML IJ SOLN: 25.0000 ug | INTRAMUSCULAR | Status: DC | PRN

## 2019-11-27 MED ORDER — ONDANSETRON HCL 4 MG/2ML IJ SOLN
INTRAMUSCULAR | Status: AC
  Filled 2019-11-27: qty 6

## 2019-11-27 MED ORDER — BACITRACIN-NEOMYCIN-POLYMYXIN 400-5-5000 EX OINT: 1.0000 "application " | TOPICAL_OINTMENT | Freq: Three times a day (TID) | CUTANEOUS | Status: DC | PRN

## 2019-11-27 MED ORDER — LIDOCAINE 2% (20 MG/ML) 5 ML SYRINGE
INTRAMUSCULAR | Status: AC
  Filled 2019-11-27: qty 15

## 2019-11-27 MED ORDER — LIDOCAINE HCL (CARDIAC) PF 100 MG/5ML IV SOSY
PREFILLED_SYRINGE | INTRAVENOUS | Status: DC | PRN
  Administered 2019-11-27: 80 mg via INTRAVENOUS

## 2019-11-27 MED ORDER — ACETAMINOPHEN 10 MG/ML IV SOLN
1000.0000 mg | Freq: Once | INTRAVENOUS | Status: DC | PRN
  Administered 2019-11-27: 1000 mg via INTRAVENOUS

## 2019-11-27 MED ORDER — DEXAMETHASONE SODIUM PHOSPHATE 10 MG/ML IJ SOLN
INTRAMUSCULAR | Status: AC
  Filled 2019-11-27: qty 3

## 2019-11-27 MED ORDER — FENTANYL CITRATE (PF) 100 MCG/2ML IJ SOLN
INTRAMUSCULAR | Status: AC
  Filled 2019-11-27: qty 2

## 2019-11-27 MED ORDER — PHENYLEPHRINE HCL-NACL 10-0.9 MG/250ML-% IV SOLN
INTRAVENOUS | Status: DC | PRN
  Administered 2019-11-27: 50 ug/min via INTRAVENOUS
  Administered 2019-11-27: 75 ug/min via INTRAVENOUS

## 2019-11-27 MED ORDER — PHENYLEPHRINE HCL (PRESSORS) 10 MG/ML IV SOLN
INTRAVENOUS | Status: AC
  Filled 2019-11-27: qty 1

## 2019-11-27 MED ORDER — SODIUM CHLORIDE 0.9 % IR SOLN
Status: DC | PRN
  Administered 2019-11-27: 3000 mL

## 2019-11-27 MED ORDER — SPIRONOLACTONE 25 MG PO TABS
25.0000 mg | ORAL_TABLET | Freq: Every day | ORAL | Status: DC
  Administered 2019-11-28: 25 mg via ORAL
  Filled 2019-11-27: qty 1

## 2019-11-27 MED ORDER — PROPOFOL 10 MG/ML IV BOLUS
INTRAVENOUS | Status: DC | PRN
  Administered 2019-11-27: 160 mg via INTRAVENOUS

## 2019-11-27 MED ORDER — ACETAMINOPHEN 160 MG/5ML PO SOLN: 325.0000 mg | ORAL | Status: DC | PRN

## 2019-11-27 MED ORDER — ACETAMINOPHEN 325 MG PO TABS: 325.0000 mg | ORAL_TABLET | ORAL | Status: DC | PRN

## 2019-11-27 MED ORDER — INSULIN ASPART 100 UNIT/ML ~~LOC~~ SOLN
0.0000 [IU] | Freq: Three times a day (TID) | SUBCUTANEOUS | Status: DC
  Administered 2019-11-27: 8 [IU] via SUBCUTANEOUS
  Administered 2019-11-28: 3 [IU] via SUBCUTANEOUS

## 2019-11-27 MED ORDER — SODIUM CHLORIDE 0.9 % IR SOLN
3000.0000 mL | Status: DC
  Administered 2019-11-27 – 2019-11-28 (×3): 3000 mL

## 2019-11-27 MED ORDER — INSULIN ASPART 100 UNIT/ML ~~LOC~~ SOLN
0.0000 [IU] | Freq: Every day | SUBCUTANEOUS | Status: DC
  Administered 2019-11-27: 3 [IU] via SUBCUTANEOUS

## 2019-11-27 MED ORDER — INSULIN ASPART 100 UNIT/ML ~~LOC~~ SOLN
SUBCUTANEOUS | Status: AC
  Administered 2019-11-27: 11 [IU] via SUBCUTANEOUS
  Filled 2019-11-27: qty 1

## 2019-11-27 MED ORDER — ACETAMINOPHEN 325 MG PO TABS: 650.0000 mg | ORAL_TABLET | ORAL | Status: DC | PRN

## 2019-11-27 MED ORDER — PROPOFOL 10 MG/ML IV BOLUS
INTRAVENOUS | Status: AC
  Filled 2019-11-27: qty 40

## 2019-11-27 MED ORDER — CARVEDILOL 3.125 MG PO TABS
3.1250 mg | ORAL_TABLET | Freq: Two times a day (BID) | ORAL | Status: DC
  Filled 2019-11-27 (×2): qty 1

## 2019-11-27 MED ORDER — PROPOFOL 10 MG/ML IV BOLUS
INTRAVENOUS | Status: AC
  Filled 2019-11-27: qty 20

## 2019-11-27 MED ORDER — FENTANYL CITRATE (PF) 100 MCG/2ML IJ SOLN
INTRAMUSCULAR | Status: DC | PRN
  Administered 2019-11-27: 25 ug via INTRAVENOUS
  Administered 2019-11-27: 75 ug via INTRAVENOUS

## 2019-11-27 MED ORDER — CHLORHEXIDINE GLUCONATE CLOTH 2 % EX PADS
6.0000 | MEDICATED_PAD | Freq: Every day | CUTANEOUS | Status: DC
  Administered 2019-11-27: 6 via TOPICAL

## 2019-11-27 MED ORDER — ACETAMINOPHEN 10 MG/ML IV SOLN
INTRAVENOUS | Status: AC
  Filled 2019-11-27: qty 100

## 2019-11-27 MED ORDER — ATORVASTATIN CALCIUM 10 MG PO TABS
10.0000 mg | ORAL_TABLET | Freq: Every day | ORAL | Status: DC
  Administered 2019-11-27: 10 mg via ORAL
  Filled 2019-11-27: qty 1

## 2019-11-27 MED ORDER — ONDANSETRON HCL 4 MG/2ML IJ SOLN
INTRAMUSCULAR | Status: DC | PRN
  Administered 2019-11-27: 4 mg via INTRAVENOUS

## 2019-11-27 MED ORDER — PIPERACILLIN-TAZOBACTAM 3.375 G IVPB
3.3750 g | Freq: Three times a day (TID) | INTRAVENOUS | Status: AC
  Administered 2019-11-27 (×2): 3.375 g via INTRAVENOUS
  Filled 2019-11-27: qty 50

## 2019-11-27 MED ORDER — DEXAMETHASONE SODIUM PHOSPHATE 4 MG/ML IJ SOLN
INTRAMUSCULAR | Status: DC | PRN
  Administered 2019-11-27: 6 mg via INTRAVENOUS

## 2019-11-27 MED ORDER — MEPERIDINE HCL 50 MG/ML IJ SOLN: 6.2500 mg | INTRAMUSCULAR | Status: DC | PRN

## 2019-11-27 MED ORDER — SACUBITRIL-VALSARTAN 24-26 MG PO TABS
1.0000 | ORAL_TABLET | Freq: Two times a day (BID) | ORAL | Status: DC
  Administered 2019-11-27 – 2019-11-28 (×2): 1 via ORAL
  Filled 2019-11-27 (×3): qty 1

## 2019-11-27 MED ORDER — LACTATED RINGERS IV SOLN: INTRAVENOUS | Status: DC

## 2019-11-27 MED ORDER — PIPERACILLIN-TAZOBACTAM 3.375 G IVPB
INTRAVENOUS | Status: AC
  Filled 2019-11-27: qty 50

## 2019-11-27 MED ORDER — VANCOMYCIN HCL IN DEXTROSE 1-5 GM/200ML-% IV SOLN
1000.0000 mg | INTRAVENOUS | Status: DC
  Administered 2019-11-27: 1000 mg via INTRAVENOUS
  Filled 2019-11-27: qty 200

## 2019-11-27 MED ORDER — HYDROCODONE-ACETAMINOPHEN 5-325 MG PO TABS: 1.0000 | ORAL_TABLET | ORAL | Status: DC | PRN

## 2019-11-27 MED ORDER — VANCOMYCIN HCL IN DEXTROSE 1-5 GM/200ML-% IV SOLN
1000.0000 mg | Freq: Two times a day (BID) | INTRAVENOUS | Status: AC
  Administered 2019-11-27: 1000 mg via INTRAVENOUS
  Filled 2019-11-27: qty 200

## 2019-11-27 SURGICAL SUPPLY — 20 items
BAG URINE DRAIN 2000ML AR STRL (UROLOGICAL SUPPLIES) ×2 IMPLANT
BAG URO CATCHER STRL LF (MISCELLANEOUS) ×2 IMPLANT
BLADE SURG 15 STRL LF DISP TIS (BLADE) IMPLANT
BLADE SURG 15 STRL SS (BLADE)
CATH FOLEY 3WAY 30CC 24FR (CATHETERS) ×2
CATH URTH STD 24FR FL 3W 2 (CATHETERS) ×1 IMPLANT
ELECT REM PT RETURN 15FT ADLT (MISCELLANEOUS) ×2 IMPLANT
EVACUATOR MICROVAS BLADDER (UROLOGICAL SUPPLIES) ×2 IMPLANT
GLOVE BIOGEL M 8.0 STRL (GLOVE) ×2 IMPLANT
GOWN STRL REUS W/TWL XL LVL3 (GOWN DISPOSABLE) ×2 IMPLANT
KIT TURNOVER KIT A (KITS) IMPLANT
LOOP CUT BIPOLAR 24F LRG (ELECTROSURGICAL) ×4 IMPLANT
MANIFOLD NEPTUNE II (INSTRUMENTS) ×2 IMPLANT
NS IRRIG 1000ML POUR BTL (IV SOLUTION) ×2 IMPLANT
PENCIL SMOKE EVACUATOR (MISCELLANEOUS) IMPLANT
SUT ETHILON 3 0 PS 1 (SUTURE) IMPLANT
SYR 30ML LL (SYRINGE) IMPLANT
TRAY CYSTO PACK (CUSTOM PROCEDURE TRAY) ×2 IMPLANT
TUBING CONNECTING 10 (TUBING) ×2 IMPLANT
TUBING UROLOGY SET (TUBING) ×2 IMPLANT

## 2019-11-27 NOTE — Anesthesia Procedure Notes (Signed)
Procedure Name: LMA Insertion Date/Time: 11/27/2019 7:42 AM Performed by: Lavina Hamman, CRNA Pre-anesthesia Checklist: Patient identified, Emergency Drugs available, Suction available and Patient being monitored Patient Re-evaluated:Patient Re-evaluated prior to induction Oxygen Delivery Method: Circle System Utilized Preoxygenation: Pre-oxygenation with 100% oxygen Induction Type: IV induction Ventilation: Mask ventilation without difficulty LMA: LMA inserted LMA Size: 5.0 Number of attempts: 1 Airway Equipment and Method: Bite block Placement Confirmation: positive ETCO2 Tube secured with: Tape Dental Injury: Teeth and Oropharynx as per pre-operative assessment  Comments: LMA 4 had a leak, LMA 5 placed.

## 2019-11-27 NOTE — Transfer of Care (Signed)
Immediate Anesthesia Transfer of Care Note  Patient: Chris Guerrero  Procedure(s) Performed: Procedure(s): TRANSURETHRAL RESECTION OF THE PROSTATE (TURP) (N/A)  Patient Location: PACU  Anesthesia Type:General  Level of Consciousness:  sedated, patient cooperative and responds to stimulation  Airway & Oxygen Therapy:Patient Spontanous Breathing and Patient connected to face mask oxgen  Post-op Assessment:  Report given to PACU RN and Post -op Vital signs reviewed and stable  Post vital signs:  Reviewed and stable  Last Vitals:  Vitals:   11/27/19 0605  BP: (!) 125/58  Pulse: 65  Resp: 18  Temp: 36.9 C  SpO2: 16%    Complications: No apparent anesthesia complications

## 2019-11-27 NOTE — Anesthesia Postprocedure Evaluation (Signed)
Anesthesia Post Note  Patient: Chris Guerrero  Procedure(s) Performed: TRANSURETHRAL RESECTION OF THE PROSTATE (TURP) (N/A )     Patient location during evaluation: PACU Anesthesia Type: General Level of consciousness: sedated Pain management: pain level controlled Vital Signs Assessment: post-procedure vital signs reviewed and stable Respiratory status: spontaneous breathing and patient connected to nasal cannula oxygen Cardiovascular status: stable Postop Assessment: no apparent nausea or vomiting Anesthetic complications: no   No complications documented.  Last Vitals:  Vitals:   11/27/19 0900 11/27/19 0915  BP: (!) 90/49 (!) 102/52  Pulse: (!) 57 (!) 58  Resp: 13 16  Temp: (!) 35.9 C (!) 35.9 C  SpO2: 100% 100%    Last Pain:  Vitals:   11/27/19 0930  TempSrc:   PainSc: [name]

## 2019-11-27 NOTE — Discharge Instructions (Addendum)
Post transurethral resection of the prostate (TURP) instructions  Your recent prostate surgery requires very special post hospital care. Despite the fact that no skin incisions were used the area around the prostate incision is quite raw and is covered with a scab to promote healing and prevent bleeding. Certain cautions are needed to assure that the scab is not disturbed of the next 2-3 weeks while the healing proceeds.  Because the raw surface in your prostate and the irritating effects of urine you may expect frequency of urination and/or urgency (a stronger desire to urinate) and perhaps even getting up at night more often. This will usually resolve or improve slowly over the healing period. You may see some blood in your urine over the first 6 weeks. Do not be alarmed, even if the urine was clear for a while. Get off your feet and drink lots of fluids until clearing occurs. If you start to pass clots or don't improve call us.  Catheter: (If you are discharged with a catheter.) 1. Keep your catheter secured to your leg at all times with tape or the supplied strap. 2. You may experience leakage of urine around your catheter- as long as the  catheter continues to drain, this is normal.  If your catheter stops draining  go to the ER. 3. You may also have blood in your urine, even after it has been clear for  several days; you may even pass some small blood clots or other material.  This  is normal as well.  If this happens, sit down and drink plenty of water to help  make urine to flush out your bladder.  If the blood in your urine becomes worse  after doing this, contact our office or return to the ER. 4. You may use the leg bag (small bag) during the day, but use the large bag at  night.  Diet:  You may return to your normal diet immediately. Because of the raw surface of your bladder, alcohol, spicy foods, foods high in acid and drinks with caffeine may cause irritation or frequency and  should be used in moderation. To keep your urine flowing freely and avoid constipation, drink plenty of fluids during the day (8-10 glasses). Tip: Avoid cranberry juice because it is very acidic.  Activity:  Your physical activity doesn't need to be restricted. However, if you are very active, you may see some blood in the urine. We suggest that you reduce your activity under the circumstances until the bleeding has stopped.  Bowels:  It is important to keep your bowels regular during the postoperative period. Straining with bowel movements can cause bleeding. A bowel movement every other day is reasonable. Use a mild laxative if needed, such as milk of magnesia 2-3 tablespoons, or 2 Dulcolax tablets. Call if you continue to have problems. If you had been taking narcotics for pain, before, during or after your surgery, you may be constipated. Take a laxative if necessary.  Medication:  You should resume your pre-surgery medications unless told not to. DO NOT RESUME YOUR ASPIRIN, WARFARIN, OR OTHER BLOOD THINNER FOR 1 WEEK. In addition you may be given an antibiotic to prevent or treat infection. Antibiotics are not always necessary. All medication should be taken as prescribed until the bottles are finished unless you are having an unusual reaction to one of the drugs.  *Restart Eliquis in 2 days as long as urine is clear. If bloody then call our office for recommendations.  Problems you should report to [place]:  a. Fever greater than 101F. b. Heavy bleeding, or clots (see notes above about blood in urine). c. Inability to urinate. d. Drug reactions (hives, rash, nausea, vomiting, diarrhea). e. Severe burning or pain with urination that is not improving.

## 2019-11-27 NOTE — Op Note (Signed)
PATIENT:  Chris Guerrero  PRE-OPERATIVE DIAGNOSIS: BPH with urinary retention  POST-OPERATIVE DIAGNOSIS: Same  PROCEDURE: TURP  SURGEON:  Claybon Jabs  INDICATION: Chris Guerrero is a 79 year old male who developed urinary retention and underwent voiding trials which were unsuccessful.  He had to wait for elective cardioversion so he could safely come off his Eliquis.  That has been performed and he presents for transurethral resection of his prostate after we had discussed the options for management.  A preop urine culture grew multiple organisms.  He was placed on oral antibiotics and received Zosyn and vancomycin preop.  ANESTHESIA:  General  EBL:  Minimal  DRAINS: 24 French Foley  SPECIMEN:  Prostate chips to pathology  After informed consent the patient was brought to the major OR and placed on the table. He was administered general anesthesia and then moved to the dorsal lithotomy position. His genitalia was sterilely prepped and draped and an official timeout was then performed.  Initially the 28 French resectoscope sheath with the visual obturator was passed into the bladder and the obturator removed. I then inserted the resectoscope element with 30 lens and  performed a systematic inspection of the bladder. I noted the bladder had 2-3+ trabeculation but was free of any tumor stones or inflammatory lesions. The ureteral orifices were noted to be of normal configuration and position. Withdrawing the scope into the prostatic urethra I noted obstructing bilobar hypertrophy with elongation of the prostatic urethra and a minimal median lobe component.  Resection was then begun I. first resected the median lobe in the midline down to the level of the bladder neck. I then began resecting the left lobe of the prostate by resecting first from the level of the bladder neck back to the level of the Veru at the 5:00 position and then progressed in a counterclockwise direction resecting all of  the adenomatous tissue of the left lobe down to the surgical capsule. Bleeding points were cauterized as they were encountered. I then turned my attention to the right lobe of the prostate and it was resected in an identical fashion. Tissue in the area of the apex was then resected circumferentially with care being taken to maintain the resection proximal to the Veru at all times. The prostatic chips were then flushed into the bladder and the Microvasive evacuator was then used to evacuate all chips from the bladder. Reinspection of the bladder revealed the mucosa to be intact, the ureteral orifices intact as well and well away from the bladder neck and area of resection. There were no prostatic chips remaining within the bladder. The prostatic capsule was intact throughout with no perforation and there was no active bleeding noted at the end of the procedure.  The resectoscope was therefore removed and the 24 French three-way Foley catheter was then inserted and balloon was filled to 30 cc. This was placed on mild traction and the bladder was irrigated with the irrigant returning clear. The catheter was then hooked to closed system drainage and continuous irrigation and the patient was awakened and taken to recovery room in stable and satisfactory condition. He tolerated the procedure well and there were no intraoperative complications.  PLAN OF CARE: Observation overnight with anticipated discharge in the morning.  PATIENT DISPOSITION:  PACU - Hemodynamically stable.

## 2019-11-28 ENCOUNTER — Ambulatory Visit (HOSPITAL_COMMUNITY): Payer: No Typology Code available for payment source

## 2019-11-28 ENCOUNTER — Encounter (HOSPITAL_COMMUNITY): Payer: Self-pay | Admitting: Urology

## 2019-11-28 DIAGNOSIS — E119 Type 2 diabetes mellitus without complications: Secondary | ICD-10-CM | POA: Diagnosis not present

## 2019-11-28 DIAGNOSIS — I509 Heart failure, unspecified: Secondary | ICD-10-CM | POA: Diagnosis not present

## 2019-11-28 DIAGNOSIS — Z79899 Other long term (current) drug therapy: Secondary | ICD-10-CM | POA: Diagnosis not present

## 2019-11-28 DIAGNOSIS — N401 Enlarged prostate with lower urinary tract symptoms: Secondary | ICD-10-CM | POA: Diagnosis not present

## 2019-11-28 DIAGNOSIS — Z7901 Long term (current) use of anticoagulants: Secondary | ICD-10-CM | POA: Diagnosis not present

## 2019-11-28 DIAGNOSIS — R338 Other retention of urine: Secondary | ICD-10-CM | POA: Diagnosis not present

## 2019-11-28 DIAGNOSIS — Z888 Allergy status to other drugs, medicaments and biological substances status: Secondary | ICD-10-CM | POA: Diagnosis not present

## 2019-11-28 DIAGNOSIS — I110 Hypertensive heart disease with heart failure: Secondary | ICD-10-CM | POA: Diagnosis not present

## 2019-11-28 LAB — GLUCOSE, CAPILLARY: Glucose-Capillary: 194 mg/dL — ABNORMAL HIGH (ref 70–99)

## 2019-11-28 LAB — SURGICAL PATHOLOGY

## 2019-11-28 MED ORDER — PHENAZOPYRIDINE HCL 200 MG PO TABS
200.0000 mg | ORAL_TABLET | Freq: Three times a day (TID) | ORAL | 0 refills | Status: DC | PRN
Start: 2019-11-28 — End: 2020-01-08

## 2019-11-28 NOTE — Progress Notes (Signed)
Patient ambulated on hall way at this time via walker, tolerated well.  

## 2019-11-28 NOTE — Progress Notes (Signed)
Pt discharging home, reviewed AVS. Discussed not to start Eliquis until 06/17 if urine is clear, if urine not clear contact urology for further instruction. Pt in stable condition at the time of discharge.

## 2019-11-28 NOTE — Discharge Summary (Signed)
Physician Discharge Summary      Patient ID: Chris Guerrero MRN: [id number] DOB/AGE: 11/12/1940 79 y.o.  Admit date: 11/27/2019 Discharge date: 11/28/2019  Admission Diagnoses: BPH with urinary obstruction [N40.1, N13.8]  Discharge Diagnoses:  Principal Problem:   BPH with urinary obstruction   Discharged Condition: good  Hospital Course: Mr. Gladu developed urinary retention secondary to BPH and was unsuccessful with voiding trials and therefore was admitted for elective TURP.  He was on preoperative oral antibiotics and received perioperative IV antibiotics.  His surgery proceeded without complication and the evening of his surgery his urine was clear on slow CBI.  He was monitored overnight and the following morning was doing well with no complaints.  Specifically he denied having any pain.  His urine was clear and his catheter had been removed.  He is tolerating a regular diet and felt ready for discharge at this time.  Discharge Exam: Blood pressure (!) 100/59, pulse 62, temperature 98.2 F (36.8 C), temperature source Oral, resp. rate 18, height 5\' 9"  (1.753 m), weight 85.3 kg, SpO2 99 %. General: Awake, alert and in no apparent distress. Chest: Normal respiratory effort. Cardiovascular: Regular rate and rhythm. Abdomen: Soft, nontender, nondistended.   Disposition: Discharge disposition: 01-Home or Self Care       Discharge Instructions    Discharge patient   Complete by: As directed    Discharge disposition: 01-Home or Self Care   Discharge patient date: 11/28/2019     Allergies as of 11/28/2019   No Known Allergies     Medication List    TAKE these medications   amiodarone 200 MG tablet Commonly known as: PACERONE Take 200 mg by mouth at bedtime.   apixaban 5 MG Tabs tablet Commonly known as: ELIQUIS Take 1 tablet (5 mg total) by mouth 2 (two) times daily.   atorvastatin 10 MG tablet Commonly known as: LIPITOR Take 10 mg by mouth at bedtime.    carvedilol 3.125 MG tablet Commonly known as: COREG Take 1 tablet (3.125 mg total) by mouth 2 (two) times daily.   digoxin 0.125 MG tablet Commonly known as: LANOXIN Take 0.5 tablets (0.0625 mg total) by mouth daily.   multivitamin tablet Take 1 tablet by mouth daily.   phenazopyridine 200 MG tablet Commonly known as: Pyridium Take 1 tablet (200 mg total) by mouth 3 (three) times daily as needed for pain.   sacubitril-valsartan 24-26 MG Commonly known as: ENTRESTO Take 1 tablet by mouth 2 (two) times daily.   spironolactone 25 MG tablet Commonly known as: ALDACTONE Take 25 mg by mouth daily.   torsemide 20 MG tablet Commonly known as: DEMADEX Take 1 tablet (20 mg total) by mouth daily.   traZODone 50 MG tablet Commonly known as: DESYREL Take 50 mg by mouth at bedtime.       Follow-up Information    [facility] On 12/04/2019.   Why: For your appiontment at 8:45 Contact information: [place] [address] [place]              Signed: Claybon Jabs 11/28/2019, 7:10 AM

## 2019-11-28 NOTE — Progress Notes (Signed)
Foley catheter discontinued as this time per MD order, patient tolerated well.

## 2019-11-30 ENCOUNTER — Ambulatory Visit (HOSPITAL_COMMUNITY): Payer: No Typology Code available for payment source

## 2019-12-01 NOTE — Progress Notes (Addendum)
PCP: [name], MD PCP-Cardiologist: Kate Sable, MD  AHF: Dr. Aundra Dubin  Urology: Dr Karsten Ro  HPI:  79 y.o. male w/prior history of HTN and hyperlipidemia, who initially reported to [facility] w/ complaints of about 1 month of cough and dyspnea with exertion as well as progressive edema. No chest pain. He saw his [facility]prior to [facility] and was noted to be in atrial fibrillation with RVR and he was then sent to [facility] for admission. Hs-TnI was flat at 25 =>27. BNP elevated, 1,068.COVID negative.He was initially tried on diltiazem gtt but became hypotensiveand changed toamiodarone gtt. Echo was done, showing EF <20% with diffuse hypokinesis, severely decreased RV function.He was transferred to [facility] on 2/18for HF work up and cath.   On arrival to [facility], he was found to be volume overloaded w/ soft SBPs and rising SCr up to 1.9 with attempts at diuresis, concerning for low output. PICC line was placed to measure co-ox and CVP. Initial co-ox low at 46%. He was started on milrinone and IV Lasix. IV amiodarone continued for afib + IV heparin.RHC/LHC 08/04/19 with no coronary disease, mildly elevated filling pressures.Co-ox improved w/ milrinone. He was ultimately weaned off milrinone and co-ox remained stable. He diuresed well w/ IV Lasix and later changed to torsemide. SCr improved with diuresis, down to 1.32.   He was set up for TEE/DCCV on 08/07/19 and found to have LAA thrombus on TEE. DCCV canceled. IV amiodarone was discontinued and he was changed to oral, 200 mg bid. Placed on Eliquis 5 bid.ASA discontinued.Plan to repeat TEE/DCCV after 1 month of chronic anticoagulation. He was advised to continue amiodarone 200 mg bid x 7 days, followed by 200 mg daily.   Also of note, he develop urinary retention due to BPH. Had over 500cc on bladder scan.Multiple attempts were made by the nursing staff to place a foley which were unsuccessful. Urology was consulted and placed16  french council foley catheter. Subsequently, he had mild hematuria which ultimately cleared. Urology recommended foley to continue on discharge with plans to be followed closely in outpatient clinic. He was discharged from the hospital 07/2419.   He had f/u w/ urology 08/15/19 and foley removed. After returning home, he redeveloped urinary retention and went to ED next day. Foley cath reinserted.   S/P DC-CV 09/08/19 with restoration of NSR.   Patient recently returned to HF Clinic with Dr. Aundra Dubin on 11/06/19. He was doing quite well symptomatically.  He was in NSR.  No significant exertional dyspnea.  No palpitations.  No chest pain.  No orthopnea, PND.  Weight was up 2 lbs.  He still had a foley catheter, plan for TURP on 6/21.     Today he returns to HF clinic for pharmacist medication titration. At last visit with MD, carvedilol 3.125 mg BID was initiated. He underwent successful TURP procedure 11/27/19. Overall he is doing well today. Only experiences lightheadedness occasionally in the mornings after getting out of bed or when standing in the heat for a prolonged time. No chest pain or palpitations. No SOB/DOE. He just joined his local [employer] to increase his activity level. His weight has been stable at home, 179-183 lbs. He takes torsemide 20 mg daily and has not needed any extra. No LEE, PND or orthopnea. His appetite is fine. Noted his blood sugars have been higher recently (in the 200s postprandial). This is managed by the [place] and they will repeat his A1C at his clinic visit in early July 2021 (  A1C 7.5 on 11/21/19).    HF Medications: Carvedilol 3.125 mg BID Entresto 24/26 mg BID Spironolactone 25 mg daily Digoxin 0.0625 mg daily Torsemide 20 mg daily  Has the patient been experiencing any side effects to the medications prescribed?  no  Does the patient have any problems obtaining medications due to transportation or finances?   Yes - noted that Eliquis and Entresto copays are very high  since he just entered the donut hole. Since he has [employer] benefits, I instructed him to ask the [place] if those medications can be filled at the [facility]. He was instructed to call me back if they cannot fill the medications and we will apply for patient assistance.   Understanding of regimen: good Understanding of indications: good Potential of compliance: good Patient understands to avoid NSAIDs. Patient understands to avoid decongestants.    Pertinent Lab Values (11/21/19): [name] Serum creatinine 1.77, BUN 43, Potassium 4.9, Sodium 134  Vital Signs: . Weight: 186.8 lbs (last clinic weight: 188 lbs) . Blood pressure: 108/66  . Heart rate: 71   Assessment: 1. Chronic systolic CHF: Echo 3/71 with EF <20%, severely decreased RV function. Nonischemic cardiomyopathy, ?tachycardia-mediated due to atrial fibrillation of uncertain duration. No prior echo or history of CHF.He required milrinone during 2/21 admission but was titrated off. Coronary angiography in 2/21 with no significant coronary disease.  - NYHA class II symptoms, euvolemic on exam -Continuetorsemide 20 mg daily.  - Increase carvedilol to 6.25 mg BID.  - ContinueEntresto 24-26 mg BID -Continue spironolactone 25 mg daily. - Continue digoxin 0.0625 daily. Last digoxin level 0.8 ng/mL on 11/06/19. - Plan for cardiac MRI to reassess LV function and look for any evidence for infiltrative disease.  2. Atrial fibrillation: Paroxysmal.  Admitted with afib/RVR of uncertain duration 0/62. Unsure if atrial fibrillation/RVR was cause of CMP (tachy-mediated) or an effect of the cardiomyopathy. TEE on 2/22 showed LA appendage thrombus. He was anticoagulated, then underwent DC-CV on 09/08/19 with conversion to NSR.  - Continue amiodarone, check LFTs/TSH/free T3/free T4 today.  He will need regular eye exam while on amiodarone.   - Continue apixaban  - Referred by Dr. Aundra Dubin to Dr. Allred/Camnitz for atrial fibrillation ablation.  Hopefully  this will allow [place] to stop amiodarone in the future.  3. CKD stage 3:  - Last Scr 1.77 4. BPH/urinary obstruction:  -TURP completed on 11/27/19 with Dr. Karsten Ro.     Plan: 1) Medication changes: Based on clinical presentation, vital signs and recent labs will increase carvedilol to 6.25 mg BID. 2) Follow-up: 4 weeks with Dr. Rush Farmer, PharmD, BCPS, [facility], CPP Heart Failure Clinic Pharmacist [phone]

## 2019-12-04 DIAGNOSIS — R338 Other retention of urine: Secondary | ICD-10-CM | POA: Diagnosis not present

## 2019-12-05 ENCOUNTER — Encounter: Payer: Self-pay | Admitting: Cardiology

## 2019-12-05 ENCOUNTER — Other Ambulatory Visit: Payer: Self-pay

## 2019-12-05 ENCOUNTER — Ambulatory Visit: Payer: Medicare Other | Admitting: Cardiology

## 2019-12-05 VITALS — BP 100/64 | HR 66 | Ht 69.0 in | Wt 186.0 lb

## 2019-12-05 DIAGNOSIS — I4819 Other persistent atrial fibrillation: Secondary | ICD-10-CM | POA: Diagnosis not present

## 2019-12-05 NOTE — Patient Instructions (Signed)
Medication Instructions:  Your physician recommends that you continue on your current medications as directed. Please refer to the Current Medication list given to you today.  *If you need a refill on your cardiac medications before your next appointment, please call your pharmacy*   Lab Work: None ordered If you have labs (blood work) drawn today and your tests are completely normal, you will receive your results only by: [name] MyChart Message (if you have MyChart) OR . A paper copy in the mail If you have any lab test that is abnormal or we need to change your treatment, we will call you to review the results.   Testing/Procedures: Your physician has recommended that you wear a 3 day holter monitor. Holter monitors are medical devices that record the heart's electrical activity. Doctors most often use these monitors to diagnose arrhythmias. Arrhythmias are problems with the speed or rhythm of the heartbeat. The monitor is a small, portable device. You can wear one while you do your normal daily activities. This is usually used to diagnose what is causing palpitations/syncope (passing out).  Your physician has recommended that you have an ablation. Catheter ablation is a medical procedure used to treat some cardiac arrhythmias (irregular heartbeats). During catheter ablation, a long, thin, flexible tube is put into a blood vessel in your groin (upper thigh), or neck. This tube is called an ablation catheter. It is then guided to your heart through the blood vessel. Radio frequency waves destroy small areas of heart tissue where abnormal heartbeats may cause an arrhythmia to start. Please see the instruction sheet given to you today.  Your physician has requested that you have cardiac CT within 7 days prior to ablation. Cardiac computed tomography (CT) is a painless test that uses an x-ray machine to take clear, detailed pictures of your heart.  Please follow instructions below located under "other  instructions".     Follow-Up: At [facility], you and your health needs are our priority.  As part of our continuing mission to provide you with exceptional heart care, we have created designated Provider Care Teams.  These Care Teams include your primary Cardiologist (physician) and Advanced Practice Providers (APPs -  Physician Assistants and Nurse Practitioners) who all work together to provide you with the care you need, when you need it.  We recommend signing up for the patient portal called "MyChart".  Sign up information is provided on this After Visit Summary.  MyChart is used to connect with patients for Virtual Visits (Telemedicine).  Patients are able to view lab/test results, encounter notes, upcoming appointments, etc.  Non-urgent messages can be sent to your provider as well.   To learn more about what you can do with MyChart, go to [email or web address].    Your next appointment:    to be determined once ablation date is scheduled  The format for your next appointment:   In Person  Provider:   Allegra Lai, MD   Thank you for choosing [facility]!!   Trinidad Curet, RN [phone]    Other Instructions  ZIO XT- Long Term Monitor Instructions   Your physician has requested you wear your ZIO patch monitor_______days.   This is a single patch monitor.  [employer] supplies one patch monitor per enrollment.  Additional stickers are not available.   Please do not apply patch if you will be having a Nuclear Stress Test, Echocardiogram, Cardiac CT, MRI, or Chest Xray during the time frame you would be wearing the monitor. The  patch cannot be worn during these tests.  You cannot remove and re-apply the ZIO XT patch monitor.   Your ZIO patch monitor will be sent USPS Priority mail from [facility] directly to your home address. The monitor may also be mailed to a PO BOX if home delivery is not available.   It may take 3-5 days to receive your monitor after  you have been enrolled.   Once you have received you monitor, please review enclosed instructions.  Your monitor has already been registered assigning a specific monitor serial # to you.   Applying the monitor   Shave hair from upper left chest.   Hold abrader disc by orange tab.  Rub abrader in 40 strokes over left upper chest as indicated in your monitor instructions.   Clean area with 4 enclosed alcohol pads .  Use all pads to assure are is cleaned thoroughly.  Let dry.   Apply patch as indicated in monitor instructions.  Patch will be place under collarbone on left side of chest with arrow pointing upward.   Rub patch adhesive wings for 2 minutes.Remove white label marked "1".  Remove white label marked "2".  Rub patch adhesive wings for 2 additional minutes.   While looking in a mirror, press and release button in center of patch.  A small green light will flash 3-4 times .  This will be your only indicator the monitor has been turned on.     Do not shower for the first 24 hours.  You may shower after the first 24 hours.   Press button if you feel a symptom. You will hear a small click.  Record Date, Time and Symptom in the Patient Log Book.   When you are ready to remove patch, follow instructions on last 2 pages of Patient Log Book.  Stick patch monitor onto last page of Patient Log Book.   Place Patient Log Book in [place] box.  Use locking tab on box and tape box closed securely.  The [employer] has [employer] on it.  Please place in mailbox as soon as possible.  Your physician should have your test results approximately 7 days after the monitor has been mailed back to [facility].   Call [place] at [phone] if you have questions regarding your ZIO XT patch monitor.  Call them immediately if you see an orange light blinking on your monitor.   If your monitor falls off in less than 4 days contact our Monitor department at [phone].  If  your monitor becomes loose or falls off after 4 days call [employer] at [phone] for suggestions on securing your monitor.      CT INSTRUCTIONS  Your cardiac CT will be scheduled at:  [facility] [address] [place], [place] [address] [phone]  Please arrive at the [facility] main entrance of [facility] on _________ at _________, please arrive 30 minutes prior to test start time. Proceed to the [facility] Radiology Department (first floor) to check-in and test prep.   Please follow these instructions carefully (unless otherwise directed):  Hold all erectile dysfunction medications at least 3 days (72 hrs) prior to test.  On the Night Before the Test: . Be sure to Drink plenty of water. . Do not consume any caffeinated/decaffeinated beverages or chocolate 12 hours prior to your test. . Do not take any antihistamines 12 hours prior to your test. .  On the Day  of the Test: . Drink plenty of water. Do not drink any water within one hour of the test. . Do not eat any food 4 hours prior to the test. . You may take your regular medications prior to the test.  . Take metoprolol (Lopressor) two hours prior to test. . HOLD Furosemide/Hydrochlorothiazide morning of the test.       After the Test: . Drink plenty of water. . After receiving IV contrast, you may experience a mild flushed feeling. This is normal. . On occasion, you may experience a mild rash up to 24 hours after the test. This is not dangerous. If this occurs, you can take Benadryl 25 mg and increase your fluid intake. . If you experience trouble breathing, this can be serious. If it is severe call 911 IMMEDIATELY. If it is mild, please call our office. . If you take any of these medications: Glipizide/Metformin, Avandament, Glucavance, please do not take 48 hours after completing test unless otherwise instructed.   Once we have confirmed authorization from your insurance company, we will  call you to set up a date and time for your test.   For non-scheduling related questions, please contact the cardiac imaging nurse navigator should you have any questions/concerns: Marchia Bond, Cardiac Imaging Nurse Navigator Burley Saver, Interim Cardiac Imaging Nurse [place] [facility] Direct Office Dial: [phone]   For scheduling needs, including cancellations and rescheduling, please call [phone].      Electrophysiology/Ablation Procedure Instructions   You are scheduled for a(n)  ablation on ___________ with Dr. Allegra Lai.   1.   Pre procedure testing-             A.  LAB WORK --- On __________ [facility] (see address at the top of this letter) at ___________ for your pre procedure blood work.                 B. COVID TEST-- On _____________ after you get your lab work- You will go to ______________________, Be sure to share with the first checkpoint that you are there for pre-procedure/surgery testing. This will put you into the right (yellow) lane that leads to the PAT testing team. Stay in your car and the nurse team will come to your car to test you.  After you are tested please go home and self quarantine until the day of your procedure.     2. On the day of your procedure ____________ you will go to [facility] hospital [phone] [address]) at ____________.  You will go to the main entrance [address] [employer]) and enter where the [employer] parking staff are.  Your driver will drop you off and you will head down the hallway to ADMITTING.  You may have one support person come in to the hospital with you.  They will be asked to wait in the waiting room.   3.   Do not eat or drink after midnight prior to your procedure.   4.   Do NOT take any medications the morning of your procedure.   5.  Plan for an overnight stay.  If you use your phone frequently bring your phone charger.   6. You will follow up with the [facility]  clinic 4 weeks after your procedure.  You will follow up with Dr. Curt Bears  3 months after your procedure.  These appointments will be made for you.   * If you  have ANY questions please call the office [phone] and ask for Sullivan Blasing RN or send me a MyChart message   * Occasionally, EP Studies and ablations can become lengthy.  Please make your family aware of this before your procedure starts.  Average time ranges from 2-8 hours for EP studies/ablations.  Your physician will call your family after the procedure with the results.                                   Cardiac Ablation Cardiac ablation is a procedure to disable (ablate) a small amount of heart tissue in very specific places. The heart has many electrical connections. Sometimes these connections are abnormal and can cause the heart to beat very fast or irregularly. Ablating some of the problem areas can improve the heart rhythm or return it to normal. Ablation may be done for people who:  Have Wolff-Parkinson-White syndrome.  Have fast heart rhythms (tachycardia).  Have taken medicines for an abnormal heart rhythm (arrhythmia) that were not effective or caused side effects.  Have a high-risk heartbeat that may be life-threatening. During the procedure, a small incision is made in the neck or the groin, and a long, thin, flexible tube (catheter) is inserted into the incision and moved to the heart. Small devices (electrodes) on the tip of the catheter will send out electrical currents. A type of X-ray (fluoroscopy) will be used to help guide the catheter and to provide images of the heart. Tell a health care provider about:  Any allergies you have.  All medicines you are taking, including vitamins, herbs, eye drops, creams, and over-the-counter medicines.  Any problems you or family members have had with anesthetic medicines.  Any blood disorders you have.  Any surgeries you have had.  Any medical conditions you have, such as  kidney failure.  Whether you are pregnant or may be pregnant. What are the risks? Generally, this is a safe procedure. However, problems may occur, including:  Infection.  Bruising and bleeding at the catheter insertion site.  Bleeding into the chest, especially into the sac that surrounds the heart. This is a serious complication.  Stroke or blood clots.  Damage to other structures or organs.  Allergic reaction to medicines or dyes.  Need for a permanent pacemaker if the normal electrical system is damaged. A pacemaker is a small computer that sends electrical signals to the heart and helps your heart beat normally.  The procedure not being fully effective. This may not be recognized until months later. Repeat ablation procedures are sometimes required. What happens before the procedure?  Follow instructions from your health care provider about eating or drinking restrictions.  Ask your health care provider about: ? Changing or stopping your regular medicines. This is especially important if you are taking diabetes medicines or blood thinners. ? Taking medicines such as aspirin and ibuprofen. These medicines can thin your blood. Do not take these medicines before your procedure if your health care provider instructs you not to.  Plan to have someone take you home from the hospital or clinic.  If you will be going home right after the procedure, plan to have someone with you for 24 hours. What happens during the procedure?  To lower your risk of infection: ? Your health care team will wash or sanitize their hands. ? Your skin will be washed with soap. ? Hair may be removed from the  incision area.  An IV tube will be inserted into one of your veins.  You will be given a medicine to help you relax (sedative).  The skin on your neck or groin will be numbed.  An incision will be made in your neck or your groin.  A needle will be inserted through the incision and into a  large vein in your neck or groin.  A catheter will be inserted into the needle and moved to your heart.  Dye may be injected through the catheter to help your surgeon see the area of the heart that needs treatment.  Electrical currents will be sent from the catheter to ablate heart tissue in desired areas. There are three types of energy that may be used to ablate heart tissue: ? Heat (radiofrequency energy). ? Laser energy. ? Extreme cold (cryoablation).  When the necessary tissue has been ablated, the catheter will be removed.  Pressure will be held on the catheter insertion area to prevent excessive bleeding.  A bandage (dressing) will be placed over the catheter insertion area. The procedure may vary among health care providers and hospitals. What happens after the procedure?  Your blood pressure, heart rate, breathing rate, and blood oxygen level will be monitored until the medicines you were given have worn off.  Your catheter insertion area will be monitored for bleeding. You will need to lie still for a few hours to ensure that you do not bleed from the catheter insertion area.  Do not drive for 24 hours or as long as directed by your health care provider. Summary  Cardiac ablation is a procedure to disable (ablate) a small amount of heart tissue in very specific places. Ablating some of the problem areas can improve the heart rhythm or return it to normal.  During the procedure, electrical currents will be sent from the catheter to ablate heart tissue in desired areas. This information is not intended to replace advice given to you by your health care provider. Make sure you discuss any questions you have with your health care provider. Document Revised: 11/22/2017 Document Reviewed: 04/20/2016 Elsevier Patient Education  2020 Elsevier Inc.    Atrial Fibrillation  Atrial fibrillation is a type of irregular or rapid heartbeat (arrhythmia). In atrial fibrillation, the top  part of the heart (atria) beats in an irregular pattern. This makes the heart unable to pump blood normally and effectively. The goal of treatment is to prevent blood clots from forming, control your heart rate, or restore your heartbeat to a normal rhythm. If this condition is not treated, it can cause serious problems, such as a weakened heart muscle (cardiomyopathy) or a stroke. What are the causes? This condition is often caused by medical conditions that damage the heart's electrical system. These include:  High blood pressure (hypertension). This is the most common cause.  Certain heart problems or conditions, such as heart failure, coronary artery disease, heart valve problems, or heart surgery.  Diabetes.  Overactive thyroid (hyperthyroidism).  Obesity.  Chronic kidney disease. In some cases, the cause of this condition is not known. What increases the risk? This condition is more likely to develop in:  Older people.  People who smoke.  Athletes who do endurance exercise.  People who have a family history of atrial fibrillation.  Men.  People who use drugs.  People who drink a lot of alcohol.  People who have lung conditions, such as emphysema, pneumonia, or COPD.  People who have obstructive sleep apnea. What  are the signs or symptoms? Symptoms of this condition include:  A feeling that your heart is racing or beating irregularly.  Discomfort or pain in your chest.  Shortness of breath.  Sudden light-headedness or weakness.  Tiring easily during exercise or activity.  Fatigue.  Syncope (fainting).  Sweating. In some cases, there are no symptoms. How is this diagnosed? Your health care provider may detect atrial fibrillation when taking your pulse. If detected, this condition may be diagnosed with:  An electrocardiogram (ECG) to check electrical signals of the heart.  An ambulatory cardiac monitor to record your heart's activity for a few  days.  A transthoracic echocardiogram (TTE) to create pictures of your heart.  A transesophageal echocardiogram (TEE) to create even closer pictures of your heart.  A stress test to check your blood supply while you exercise.  Imaging tests, such as a CT scan or chest X-ray.  Blood tests. How is this treated? Treatment depends on underlying conditions and how you feel when you experience atrial fibrillation. This condition may be treated with:  Medicines to prevent blood clots or to treat heart rate or heart rhythm problems.  Electrical cardioversion to reset the heart's rhythm.  A pacemaker to correct abnormal heart rhythm.  Ablation to remove the heart tissue that sends abnormal signals.  Left atrial appendage closure to seal the area where blood clots can form. In some cases, underlying conditions will be treated. Follow these instructions at home: Medicines  Take over-the counter and prescription medicines only as told by your health care provider.  Do not take any new medicines without talking to your health care provider.  If you are taking blood thinners: ? Talk with your health care provider before you take any medicines that contain aspirin or NSAIDs, such as ibuprofen. These medicines increase your risk for dangerous bleeding. ? Take your medicine exactly as told, at the same time every day. ? Avoid activities that could cause injury or bruising, and follow instructions about how to prevent falls. ? Wear a medical alert bracelet or carry a card that lists what medicines you take. Lifestyle      Do not use any products that contain nicotine or tobacco, such as cigarettes, e-cigarettes, and chewing tobacco. If you need help quitting, ask your health care provider.  Eat heart-healthy foods. Talk with a dietitian to make an eating plan that is right for you.  Exercise regularly as told by your health care provider.  Do not drink alcohol.  Lose weight if you are  overweight.  Do not use drugs, including cannabis. General instructions  If you have obstructive sleep apnea, manage your condition as told by your health care provider.  Do not use diet pills unless your health care provider approves. Diet pills can make heart problems worse.  Keep all follow-up visits as told by your health care provider. This is important. Contact a health care provider if you:  Notice a change in the rate, rhythm, or strength of your heartbeat.  Are taking a blood thinner and you notice more bruising.  Tire more easily when you exercise or do heavy work.  Have a sudden change in weight. Get help right away if you have:   Chest pain, abdominal pain, sweating, or weakness.  Trouble breathing.  Side effects of blood thinners, such as blood in your vomit, stool, or urine, or bleeding that cannot stop.  Any symptoms of a stroke. "BE FAST" is an easy way to remember the main  warning signs of a stroke: ? B - Balance. Signs are dizziness, sudden trouble walking, or loss of balance. ? E - Eyes. Signs are trouble seeing or a sudden change in vision. ? F - Face. Signs are sudden weakness or numbness of the face, or the face or eyelid drooping on one side. ? A - Arms. Signs are weakness or numbness in an arm. This happens suddenly and usually on one side of the body. ? S - Speech. Signs are sudden trouble speaking, slurred speech, or trouble understanding what people say. ? T - Time. Time to call emergency services. Write down what time symptoms started.  Other signs of a stroke, such as: ? A sudden, severe headache with no known cause. ? Nausea or vomiting. ? Seizure. These symptoms may represent a serious problem that is an emergency. Do not wait to see if the symptoms will go away. Get medical help right away. Call your local emergency services (911 in the [place] Do not drive yourself to the hospital. Summary  Atrial fibrillation is a type of irregular or rapid  heartbeat (arrhythmia).  Symptoms include a feeling that your heart is beating fast or irregularly.  You may be given medicines to prevent blood clots or to treat heart rate or heart rhythm problems.  Get help right away if you have signs or symptoms of a stroke.  Get help right away if you cannot catch your breath or have chest pain or pressure. This information is not intended to replace advice given to you by your health care provider. Make sure you discuss any questions you have with your health care provider. Document Revised: 11/23/2018 Document Reviewed: 11/23/2018 Elsevier Patient Education  [place].    Cardiac Ablation Cardiac ablation is a procedure to disable (ablate) a small amount of heart tissue in very specific places. The heart has many electrical connections. Sometimes these connections are abnormal and can cause the heart to beat very fast or irregularly. Ablating some of the problem areas can improve the heart rhythm or return it to normal. Ablation may be done for people who:  Have Wolff-Parkinson-White syndrome.  Have fast heart rhythms (tachycardia).  Have taken medicines for an abnormal heart rhythm (arrhythmia) that were not effective or caused side effects.  Have a high-risk heartbeat that may be life-threatening. During the procedure, a small incision is made in the neck or the groin, and a long, thin, flexible tube (catheter) is inserted into the incision and moved to the heart. Small devices (electrodes) on the tip of the catheter will send out electrical currents. A type of X-ray (fluoroscopy) will be used to help guide the catheter and to provide images of the heart. Tell a health care provider about:  Any allergies you have.  All medicines you are taking, including vitamins, herbs, eye drops, creams, and over-the-counter medicines.  Any problems you or family members have had with anesthetic medicines.  Any blood disorders you have.  Any  surgeries you have had.  Any medical conditions you have, such as kidney failure.  Whether you are pregnant or may be pregnant. What are the risks? Generally, this is a safe procedure. However, problems may occur, including:  Infection.  Bruising and bleeding at the catheter insertion site.  Bleeding into the chest, especially into the sac that surrounds the heart. This is a serious complication.  Stroke or blood clots.  Damage to other structures or organs.  Allergic reaction to medicines or dyes.  Need  for a permanent pacemaker if the normal electrical system is damaged. A pacemaker is a small computer that sends electrical signals to the heart and helps your heart beat normally.  The procedure not being fully effective. This may not be recognized until months later. Repeat ablation procedures are sometimes required. What happens before the procedure?  Follow instructions from your health care provider about eating or drinking restrictions.  Ask your health care provider about: ? Changing or stopping your regular medicines. This is especially important if you are taking diabetes medicines or blood thinners. ? Taking medicines such as aspirin and ibuprofen. These medicines can thin your blood. Do not take these medicines before your procedure if your health care provider instructs you not to.  Plan to have someone take you home from the hospital or clinic.  If you will be going home right after the procedure, plan to have someone with you for 24 hours. What happens during the procedure?  To lower your risk of infection: ? Your health care team will wash or sanitize their hands. ? Your skin will be washed with soap. ? Hair may be removed from the incision area.  An IV tube will be inserted into one of your veins.  You will be given a medicine to help you relax (sedative).  The skin on your neck or groin will be numbed.  An incision will be made in your neck or your  groin.  A needle will be inserted through the incision and into a large vein in your neck or groin.  A catheter will be inserted into the needle and moved to your heart.  Dye may be injected through the catheter to help your surgeon see the area of the heart that needs treatment.  Electrical currents will be sent from the catheter to ablate heart tissue in desired areas. There are three types of energy that may be used to ablate heart tissue: ? Heat (radiofrequency energy). ? Laser energy. ? Extreme cold (cryoablation).  When the necessary tissue has been ablated, the catheter will be removed.  Pressure will be held on the catheter insertion area to prevent excessive bleeding.  A bandage (dressing) will be placed over the catheter insertion area. The procedure may vary among health care providers and hospitals. What happens after the procedure?  Your blood pressure, heart rate, breathing rate, and blood oxygen level will be monitored until the medicines you were given have worn off.  Your catheter insertion area will be monitored for bleeding. You will need to lie still for a few hours to ensure that you do not bleed from the catheter insertion area.  Do not drive for 24 hours or as long as directed by your health care provider. Summary  Cardiac ablation is a procedure to disable (ablate) a small amount of heart tissue in very specific places. Ablating some of the problem areas can improve the heart rhythm or return it to normal.  During the procedure, electrical currents will be sent from the catheter to ablate heart tissue in desired areas. This information is not intended to replace advice given to you by your health care provider. Make sure you discuss any questions you have with your health care provider. Document Revised: 11/22/2017 Document Reviewed: 04/20/2016 Elsevier Patient Education  [place].

## 2019-12-05 NOTE — Progress Notes (Signed)
Electrophysiology Office Note   Date:  12/05/2019   ID:  Chris Guerrero, DOB 02/25/1941, MRN [id number]  PCP:  [name], MD  Cardiologist:  Aundra Dubin Primary Electrophysiologist:  Audre Cenci Meredith Leeds, MD    Chief Complaint: AF   History of Present Illness: Chris Guerrero is a 79 y.o. male who is being seen today for the evaluation of AF at the request of Larey Dresser, MD. Presenting today for electrophysiology evaluation.  He has a history of hypertension and hyperlipidemia.  He reported to [facility] with complaints of cough and dyspnea on exertion as well as progressive edema.  He has saw his PCP prior to coming to the hospital and was noted to be in rapid atrial fibrillation.  His BNP was 1068 at the time.  He was Covid negative.  He was started on diltiazem though he became hypotensive and was started on amiodarone.  An echo was done that showed an ejection fraction of less than 20% with diffuse hypokinesis and a severely decreased RV systolic function.  He required milrinone and IV amiodarone due to low output heart failure.  Right and left heart cath showed no coronary artery disease with mildly elevated filling pressures.  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  He had a repeat cardioversion 09/08/2019 with restoration of sinus rhythm.  Today, he denies symptoms of palpitations, chest pain, shortness of breath, orthopnea, PND, lower extremity edema, claudication, dizziness, presyncope, syncope, bleeding, or neurologic sequela. The patient is tolerating medications without difficulties.  Since returning to sinus rhythm he has felt well.  He has no chest pain or shortness of breath and is able to do all of his daily activities.  He is scheduled for already echo MRI in a few weeks.   Past Medical History:  Diagnosis Date  . Atrial fibrillation ([place])   . BPH with obstruction/lower urinary tract symptoms   . CHF (congestive  heart failure) ([place])   . CKD (chronic kidney disease), stage III   . Diabetes mellitus without complication ([place])   . History of kidney stones   . Hyperlipemia   . Hypertension   . Nonischemic cardiomyopathy [facility])    Past Surgical History:  Procedure Laterality Date  . CARDIOVERSION N/A 09/08/2019   Procedure: CARDIOVERSION;  Surgeon: Larey Dresser, MD;  Location: [facility] ENDOSCOPY;  Service: Cardiovascular;  Laterality: N/A;  . CATARACT EXTRACTION W/ INTRAOCULAR LENS IMPLANT    . CHOLECYSTECTOMY    . COLONOSCOPY  09/04/2010   RMR: diverticula, cecal polyp = tubular adenoma; Repeat in 5 years  . COLONOSCOPY N/A 10/10/2015   Procedure: COLONOSCOPY;  Surgeon: Daneil Dolin, MD;  Location: [facility] ENDO SUITE;  Service: Endoscopy;  Laterality: N/A;  1300  . HERNIA REPAIR    . RIGHT/LEFT HEART CATH AND CORONARY ANGIOGRAPHY N/A 08/04/2019   Procedure: RIGHT/LEFT HEART CATH AND CORONARY ANGIOGRAPHY;  Surgeon: Larey Dresser, MD;  Location: [facility] CV LAB;  Service: Cardiovascular;  Laterality: N/A;  . TEE WITHOUT CARDIOVERSION N/A 08/07/2019   Procedure: TRANSESOPHAGEAL ECHOCARDIOGRAM (TEE);  Surgeon: Larey Dresser, MD;  Location: [facility] ENDOSCOPY;  Service: Cardiovascular;  Laterality: N/A;  . TEE WITHOUT CARDIOVERSION N/A 09/08/2019   Procedure: TRANSESOPHAGEAL ECHOCARDIOGRAM (TEE);  Surgeon: Larey Dresser, MD;  Location: [facility] ENDOSCOPY;  Service: Cardiovascular;  Laterality: N/A;  . TRANSURETHRAL RESECTION OF PROSTATE N/A 11/27/2019   Procedure: TRANSURETHRAL RESECTION OF THE PROSTATE (TURP);  Surgeon:  Kathie Rhodes, MD;  Location: [facility] ORS;  Service: Urology;  Laterality: N/A;     Current Outpatient Medications  Medication Sig Dispense Refill  . amiodarone (PACERONE) 200 MG tablet Take 200 mg by mouth at bedtime.    [name] apixaban (ELIQUIS) 5 MG TABS tablet Take 1 tablet (5 mg total) by mouth 2 (two) times daily. 180 tablet 3  . atorvastatin (LIPITOR) 10 MG tablet Take 10 mg by mouth at bedtime.      . carvedilol (COREG) 3.125 MG tablet Take 1 tablet (3.125 mg total) by mouth 2 (two) times daily. 60 tablet 3  . digoxin (LANOXIN) 0.125 MG tablet Take 0.5 tablets (0.0625 mg total) by mouth daily. 45 tablet 3  . Multiple Vitamin (MULTIVITAMIN) tablet Take 1 tablet by mouth daily.    . phenazopyridine (PYRIDIUM) 200 MG tablet Take 1 tablet (200 mg total) by mouth 3 (three) times daily as needed for pain. 20 tablet 0  . sacubitril-valsartan (ENTRESTO) 24-26 MG Take 1 tablet by mouth 2 (two) times daily. 180 tablet 3  . spironolactone (ALDACTONE) 25 MG tablet Take 25 mg by mouth daily.    [name] torsemide (DEMADEX) 20 MG tablet Take 1 tablet (20 mg total) by mouth daily. 90 tablet 3  . traZODone (DESYREL) 50 MG tablet Take 50 mg by mouth at bedtime.     No current facility-administered medications for this visit.    Allergies:   Patient has no known allergies.   Social History:  The patient  reports that he has been smoking cigars. His smokeless tobacco use includes chew. He reports current alcohol use of about 1.0 standard drink of alcohol per week. He reports that he does not use drugs.   Family History:  The patient's family history includes Diabetes in his father and mother; Heart disease in his father and mother; Hypertension in his father and mother; Stroke in his father.    ROS:  Please see the history of present illness.   Otherwise, review of systems is positive for none.   All other systems are reviewed and negative.    PHYSICAL EXAM: VS:  BP 100/64   Pulse 66   Ht 5\' 9"  (1.753 m)   Wt 186 lb (84.4 kg)   SpO2 96%   BMI 27.47 kg/m  , BMI Body mass index is 27.47 kg/m. GEN: Well nourished, well developed, in no acute distress  HEENT: normal  Neck: no JVD, carotid bruits, or masses Cardiac: RRR; no murmurs, rubs, or gallops,no edema  Respiratory:  clear to auscultation bilaterally, normal work of breathing GI: soft, nontender, nondistended, + BS MS: no deformity or atrophy   Skin: warm and dry Neuro:  Strength and sensation are intact Psych: euthymic mood, full affect  EKG:  EKG is ordered today. Personal review of the ekg ordered shows sinus rhythm, ventricular bigeminy  Recent Labs: 08/07/2019: Magnesium 2.0 08/28/2019: B Natriuretic Peptide 362.0 11/06/2019: ALT 24; TSH 5.278 11/21/2019: BUN 43; Creatinine, Ser 1.77; Hemoglobin 13.3; Platelets 246; Potassium 4.9; Sodium 134    Lipid Panel  No results found for: CHOL, TRIG, HDL, CHOLHDL, VLDL, LDLCALC, LDLDIRECT   Wt Readings from Last 3 Encounters:  12/05/19 186 lb (84.4 kg)  11/27/19 188 lb 1 oz (85.3 kg)  11/21/19 188 lb 1 oz (85.3 kg)      Other studies Reviewed: Additional studies/ records that were reviewed today include: RHC/LHC 08/04/19  Review of the above records today demonstrates:  1. Mildly elevated filling pressures.  2. Excellent cardiac output on milrinone.  3. No significant coronary disease.    TEE 09/20/19 1. Left ventricular ejection fraction, by estimation, is 25 to 30%. The  left ventricle has severely decreased function. The left ventricle  demonstrates global hypokinesis. The left ventricular internal cavity size  was mildly dilated.  2. Right ventricular systolic function is normal. The right ventricular  size is mildly enlarged. There is normal pulmonary artery systolic  pressure.  3. There was a small PFO by color doppler. Left atrial size was  moderately dilated. No left atrial/left atrial appendage thrombus was  detected.  4. Right atrial size was moderately dilated.  5. The mitral valve is normal in structure. Trivial mitral valve  regurgitation. No evidence of mitral stenosis.  6. The aortic valve is tricuspid. Aortic valve regurgitation is not  visualized. No aortic stenosis is present.  7. Normal caliber thoracic aorta with grade 3 plaque in the descending  thoracic aorta.    ASSESSMENT AND PLAN:  1.  Persistent atrial fibrillation: Currently on  amiodarone and Eliquis.  CHA2DS2-VASc of at least 2.  Evaluating for high risk medication toxicity with amiodarone shows normal LFTs but an elevated TSH.  Ablation would likely be the best option to get him off of amiodarone.  Risks and benefits were discussed which include bleeding, tamponade, heart block, stroke, damage surrounding organs.  He understands these risks and is agreed to the procedure.  2.  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  Has cardiac MRI scheduled to reevaluate ejection fraction.  3.  CKD stage III: Plan per heart failure cardiology.  4.  PVCs: Patient is ventricular bigeminy today.  We Yessenia Maillet have him wear a 3-day monitor to further determine his overall burden.  Case discussed with referring cardiologist  Current medicines are reviewed at length with the patient today.   The patient does not have concerns regarding his medicines.  The following changes were made today:  none  Labs/ tests ordered today include:  Orders Placed This Encounter  Procedures  . EKG 12-Lead     Disposition:   FU with Dayjah Selman 3 months  Signed, Homar Weinkauf Meredith Leeds, MD  12/05/2019 10:38 AM     [facility] [address] [place] [address] [phone] (office) [phone] (fax)

## 2019-12-12 ENCOUNTER — Telehealth (HOSPITAL_COMMUNITY): Payer: Self-pay | Admitting: *Deleted

## 2019-12-12 NOTE — Telephone Encounter (Signed)
Reaching out to patient to offer assistance regarding upcoming cardiac imaging study; pt verbalizes understanding of appt date/time, parking situation and where to check in, and verified current allergies; name and call back number provided for further questions should they arise ° °Kerry Chisolm Tai RN Navigator Cardiac Imaging ° Heart and Vascular °[phone] office °[phone] cell °

## 2019-12-13 ENCOUNTER — Ambulatory Visit (HOSPITAL_COMMUNITY)
Admission: RE | Admit: 2019-12-13 | Discharge: 2019-12-13 | Disposition: A | Discharge status: Home / Self Care | Payer: Medicare Other | Source: Ambulatory Visit | Attending: Cardiology | Admitting: Cardiology

## 2019-12-13 ENCOUNTER — Other Ambulatory Visit: Payer: Self-pay

## 2019-12-13 ENCOUNTER — Ambulatory Visit (HOSPITAL_COMMUNITY)
Admission: RE | Admit: 2019-12-13 | Discharge: 2019-12-13 | Disposition: A | Discharge status: Home / Self Care | Payer: Medicare Other | Source: Ambulatory Visit | Attending: Internal Medicine | Admitting: Internal Medicine

## 2019-12-13 ENCOUNTER — Telehealth (HOSPITAL_COMMUNITY): Payer: Self-pay | Admitting: *Deleted

## 2019-12-13 ENCOUNTER — Telehealth (HOSPITAL_COMMUNITY): Payer: Self-pay

## 2019-12-13 VITALS — BP 108/66 | HR 71 | Wt 186.8 lb

## 2019-12-13 DIAGNOSIS — I5022 Chronic systolic (congestive) heart failure: Secondary | ICD-10-CM | POA: Diagnosis not present

## 2019-12-13 DIAGNOSIS — I130 Hypertensive heart and chronic kidney disease with heart failure and stage 1 through stage 4 chronic kidney disease, or unspecified chronic kidney disease: Secondary | ICD-10-CM | POA: Insufficient documentation

## 2019-12-13 DIAGNOSIS — I428 Other cardiomyopathies: Secondary | ICD-10-CM | POA: Insufficient documentation

## 2019-12-13 DIAGNOSIS — Z79899 Other long term (current) drug therapy: Secondary | ICD-10-CM | POA: Insufficient documentation

## 2019-12-13 DIAGNOSIS — N183 Chronic kidney disease, stage 3 unspecified: Secondary | ICD-10-CM | POA: Diagnosis not present

## 2019-12-13 DIAGNOSIS — N138 Other obstructive and reflux uropathy: Secondary | ICD-10-CM | POA: Insufficient documentation

## 2019-12-13 DIAGNOSIS — Z7901 Long term (current) use of anticoagulants: Secondary | ICD-10-CM | POA: Diagnosis not present

## 2019-12-13 DIAGNOSIS — Z9079 Acquired absence of other genital organ(s): Secondary | ICD-10-CM | POA: Insufficient documentation

## 2019-12-13 DIAGNOSIS — I480 Paroxysmal atrial fibrillation: Secondary | ICD-10-CM | POA: Insufficient documentation

## 2019-12-13 DIAGNOSIS — E785 Hyperlipidemia, unspecified: Secondary | ICD-10-CM | POA: Insufficient documentation

## 2019-12-13 DIAGNOSIS — N401 Enlarged prostate with lower urinary tract symptoms: Secondary | ICD-10-CM | POA: Insufficient documentation

## 2019-12-13 MED ORDER — GADOBUTROL 1 MMOL/ML IV SOLN
10.0000 mL | Freq: Once | INTRAVENOUS | Status: AC | PRN
  Administered 2019-12-13: 10 mL via INTRAVENOUS

## 2019-12-13 MED ORDER — CARVEDILOL 6.25 MG PO TABS: 6.2500 mg | ORAL_TABLET | Freq: Two times a day (BID) | ORAL | 3 refills | Status: DC

## 2019-12-13 MED ORDER — APIXABAN 5 MG PO TABS: 5.0000 mg | ORAL_TABLET | Freq: Two times a day (BID) | ORAL | 3 refills | Status: DC

## 2019-12-13 MED ORDER — SACUBITRIL-VALSARTAN 24-26 MG PO TABS: 1.0000 | ORAL_TABLET | Freq: Two times a day (BID) | ORAL | 3 refills | Status: DC

## 2019-12-13 NOTE — Telephone Encounter (Signed)
rx sent,[facility]

## 2019-12-13 NOTE — Telephone Encounter (Signed)
-----   Message from Orma Render, [place] sent at 12/13/2019 11:47 AM EDT ----- Hey Curlie Macken,  This is the patient we were discussing. Can you send over his most recent progress note from Dr. Aundra Dubin and the Eliquis/Entresto prescriptions to the [facility]? We are going to see if they will fill those so we dont have to do patient assistance.  Thank you so much!  Ander Purpura

## 2019-12-13 NOTE — Patient Instructions (Addendum)
It was a pleasure seeing you today!  MEDICATIONS: -We are changing your medications today -Increase carvedilol to 6.25 mg (1 tablet) twice daily. You may take 2 tablets of the 3.125 mg strength twice daily until you can pick up the new strength. -Call if you have questions about your medications.   NEXT APPOINTMENT: Return to clinic in 4 weeks with Dr. Aundra Dubin.  In general, to take care of your heart failure: -Limit your fluid intake to 2 Liters (half-gallon) per day.   -Limit your salt intake to ideally 2-3 grams (2000-3000 mg) per day. -Weigh yourself daily and record, and bring that "weight diary" to your next appointment.  (Weight gain of 2-3 pounds in 1 day typically means fluid weight.) -The medications for your heart are to help your heart and help you live longer.   -Please contact us before stopping any of your heart medications.  Call the clinic at [phone] with questions or to reschedule future appointments.

## 2019-12-13 NOTE — Telephone Encounter (Signed)
error 

## 2019-12-20 ENCOUNTER — Telehealth (HOSPITAL_COMMUNITY): Payer: Self-pay | Admitting: *Deleted

## 2019-12-20 DIAGNOSIS — I5022 Chronic systolic (congestive) heart failure: Secondary | ICD-10-CM

## 2019-12-20 DIAGNOSIS — I493 Ventricular premature depolarization: Secondary | ICD-10-CM

## 2019-12-20 NOTE — Telephone Encounter (Signed)
-----   Message from Larey Dresser, MD sent at 12/18/2019 11:39 AM EDT ----- EF estimated 35% but unable to quantify with likely frequent PVCs.    1. Needs Zio patch x 7 days to quantify PVCs, please order.   2. Will review need for ICD with Dr. Curt Bears.  3. Scar consistent with inferolateral infarction noted, but no CAD on recent cath.

## 2019-12-20 NOTE — Telephone Encounter (Signed)
Marsh Dolly [place], RN  12/20/2019 5:16 PM EDT Back to Top    Pt aware and agreeable, he will come to our office tomorrow for Zio placement.

## 2019-12-21 ENCOUNTER — Other Ambulatory Visit: Payer: Self-pay

## 2019-12-21 ENCOUNTER — Ambulatory Visit (HOSPITAL_COMMUNITY)
Admission: RE | Admit: 2019-12-21 | Discharge: 2019-12-21 | Disposition: A | Discharge status: Home / Self Care | Payer: Medicare Other | Source: Ambulatory Visit | Attending: Cardiology | Admitting: Cardiology

## 2019-12-21 DIAGNOSIS — I493 Ventricular premature depolarization: Secondary | ICD-10-CM

## 2020-01-08 ENCOUNTER — Ambulatory Visit (HOSPITAL_COMMUNITY)
Admission: RE | Admit: 2020-01-08 | Discharge: 2020-01-08 | Disposition: A | Discharge status: Home / Self Care | Payer: Medicare Other | Source: Ambulatory Visit | Attending: Cardiology | Admitting: Cardiology

## 2020-01-08 ENCOUNTER — Other Ambulatory Visit: Payer: Self-pay

## 2020-01-08 ENCOUNTER — Encounter (HOSPITAL_COMMUNITY): Payer: Self-pay | Admitting: Cardiology

## 2020-01-08 VITALS — BP 110/62 | HR 60 | Wt 189.6 lb

## 2020-01-08 DIAGNOSIS — R319 Hematuria, unspecified: Secondary | ICD-10-CM | POA: Insufficient documentation

## 2020-01-08 DIAGNOSIS — I480 Paroxysmal atrial fibrillation: Secondary | ICD-10-CM | POA: Insufficient documentation

## 2020-01-08 DIAGNOSIS — N183 Chronic kidney disease, stage 3 unspecified: Secondary | ICD-10-CM | POA: Diagnosis not present

## 2020-01-08 DIAGNOSIS — Z7984 Long term (current) use of oral hypoglycemic drugs: Secondary | ICD-10-CM | POA: Diagnosis not present

## 2020-01-08 DIAGNOSIS — Z833 Family history of diabetes mellitus: Secondary | ICD-10-CM | POA: Diagnosis not present

## 2020-01-08 DIAGNOSIS — R338 Other retention of urine: Secondary | ICD-10-CM | POA: Diagnosis not present

## 2020-01-08 DIAGNOSIS — Z79899 Other long term (current) drug therapy: Secondary | ICD-10-CM | POA: Insufficient documentation

## 2020-01-08 DIAGNOSIS — Z9079 Acquired absence of other genital organ(s): Secondary | ICD-10-CM | POA: Insufficient documentation

## 2020-01-08 DIAGNOSIS — Z7901 Long term (current) use of anticoagulants: Secondary | ICD-10-CM | POA: Diagnosis not present

## 2020-01-08 DIAGNOSIS — I5022 Chronic systolic (congestive) heart failure: Secondary | ICD-10-CM | POA: Diagnosis not present

## 2020-01-08 DIAGNOSIS — N401 Enlarged prostate with lower urinary tract symptoms: Secondary | ICD-10-CM | POA: Insufficient documentation

## 2020-01-08 DIAGNOSIS — I5041 Acute combined systolic (congestive) and diastolic (congestive) heart failure: Secondary | ICD-10-CM

## 2020-01-08 DIAGNOSIS — E1122 Type 2 diabetes mellitus with diabetic chronic kidney disease: Secondary | ICD-10-CM | POA: Diagnosis not present

## 2020-01-08 DIAGNOSIS — F17220 Nicotine dependence, chewing tobacco, uncomplicated: Secondary | ICD-10-CM | POA: Insufficient documentation

## 2020-01-08 DIAGNOSIS — Z87442 Personal history of urinary calculi: Secondary | ICD-10-CM | POA: Diagnosis not present

## 2020-01-08 DIAGNOSIS — I428 Other cardiomyopathies: Secondary | ICD-10-CM | POA: Diagnosis not present

## 2020-01-08 DIAGNOSIS — Z8249 Family history of ischemic heart disease and other diseases of the circulatory system: Secondary | ICD-10-CM | POA: Diagnosis not present

## 2020-01-08 DIAGNOSIS — E785 Hyperlipidemia, unspecified: Secondary | ICD-10-CM | POA: Insufficient documentation

## 2020-01-08 DIAGNOSIS — I130 Hypertensive heart and chronic kidney disease with heart failure and stage 1 through stage 4 chronic kidney disease, or unspecified chronic kidney disease: Secondary | ICD-10-CM | POA: Diagnosis not present

## 2020-01-08 LAB — TSH: TSH: 5.04 u[IU]/mL — ABNORMAL HIGH (ref 0.350–4.500)

## 2020-01-08 LAB — COMPREHENSIVE METABOLIC PANEL
ALT: 35 U/L (ref 0–44)
AST: 39 U/L (ref 15–41)
Albumin: 3.9 g/dL (ref 3.5–5.0)
Alkaline Phosphatase: 61 U/L (ref 38–126)
Anion gap: 12 (ref 5–15)
BUN: 48 mg/dL — ABNORMAL HIGH (ref 8–23)
CO2: 24 mmol/L (ref 22–32)
Calcium: 9.4 mg/dL (ref 8.9–10.3)
Chloride: 92 mmol/L — ABNORMAL LOW (ref 98–111)
Creatinine, Ser: 2.14 mg/dL — ABNORMAL HIGH (ref 0.61–1.24)
GFR calc Af Amer: 32 mL/min — ABNORMAL LOW (ref >60)
GFR calc non Af Amer: 28 mL/min — ABNORMAL LOW (ref >60)
Glucose, Bld: 304 mg/dL — ABNORMAL HIGH (ref 70–99)
Potassium: 5.7 mmol/L — ABNORMAL HIGH (ref 3.5–5.1)
Sodium: 128 mmol/L — ABNORMAL LOW (ref 135–145)
Total Bilirubin: 1.4 mg/dL — ABNORMAL HIGH (ref 0.3–1.2)
Total Protein: 7.2 g/dL (ref 6.5–8.1)

## 2020-01-08 LAB — DIGOXIN LEVEL: Digoxin Level: 0.9 ng/mL (ref 0.8–2.0)

## 2020-01-08 MED ORDER — DAPAGLIFLOZIN PROPANEDIOL 10 MG PO TABS
10.0000 mg | ORAL_TABLET | Freq: Every day | ORAL | 3 refills | Status: DC
Start: 2020-01-08 — End: 2020-01-24

## 2020-01-08 NOTE — Patient Instructions (Signed)
Decrease Orange Juice    START Farxiga 10mg  Daily   Labs done today, your results will be available in MyChart, we will contact you for abnormal readings.   Repeat labs in 1 week   Please call our office in October to schedule your follow up appointment   You have been referred to Cardiac Rehab at [facility]. They will contact you for a follow up appointment.    If you have any questions or concerns before your next appointment please send [place] a message through [place] or call our office at [phone].    TO LEAVE A MESSAGE FOR THE NURSE SELECT OPTION 2, PLEASE LEAVE A MESSAGE INCLUDING:  YOUR NAME  DATE OF BIRTH  CALL BACK NUMBER  REASON FOR CALL**this is important as we prioritize the call backs  YOU WILL RECEIVE A CALL BACK THE SAME DAY AS LONG AS YOU CALL BEFORE 4:00 PM    At the [facility], you and your health needs are our priority. As part of our continuing mission to provide you with exceptional heart care, we have created designated Provider Care Teams. These Care Teams include your primary Cardiologist (physician) and Advanced Practice Providers (APPs- Physician Assistants and Nurse Practitioners) who all work together to provide you with the care you need, when you need it.   You may see any of the following providers on your designated Care Team at your next follow up:  Dr Glori Bickers  Dr Haynes Kerns, NP  Lyda Jester, [place]  Audry Riles, PharmD   Please be sure to bring in all your medications bottles to every appointment.

## 2020-01-08 NOTE — Progress Notes (Signed)
t  Advanced Heart Failure Clinic Note    PCP: [name], MD PCP-Cardiologist: Kate Sable, MD (Inactive)  AHF: Dr. Aundra Dubin  Urology: Dr Karsten Ro   HPI: 79 y.o. male w/prior history of HTN and hyperlipidemia, who initially reported to [facility] w/ complaints of about 1 month of cough and dyspnea with exertion as well as progressive edema. No chest pain. He [facility] his [facility] and was noted to be in atrial fibrillation with RVR and he was then sent to [facility] for admission. Hs-TnI was flat at 25 =>27. BNP elevated, 1,068.COVID negative.He was initially tried on diltiazem gtt but became hypotensiveand changed toamiodarone gtt. Echo was done, showing EF <20% with diffuse hypokinesis, severely decreased RV function.He was transferred to [facility] on 2/18for HF work up and cath.   On arrival to [facility], he was found to be volume overloaded w/ soft SBPs and rising SCr up to 1.9 with attempts at diuresis, concerning for low output. PICC line was placed to measure co-ox and CVP. Initial co-ox low at 46%. He was started on milrinone and IV Lasix. IV amiodarone continued for afib + IV heparin.RHC/LHC 08/04/19 with no coronary disease, mildly elevated filling pressures.Co-ox improved w/ milrinone. He was ultimately weaned off milrinone and co-ox remained stable. He diuresed well w/ IV Lasix and later changed to torsemide. SCr improved w/ diuresis, down to 1.32.   He was set up for TEE/DCCV on 08/07/19 and found to have LAA thrombus on TEE. DCCV canceled. IV amiodarone was discontinued and he was changed to PO, 200 mg bid. Placed on Eliquis 5 bid.ASA discontinued.Plan to repeat TEE/DCCV after 1 month of chronic anticoagulation. He was advised to continue amiodarone 200 mg bid x 7 days, followed by 200 mg daily.   Also of note, he develop urinary retention due to BPH. Had over 500cc on bladder scan.Multiple attempts were made by the nursing staff to place a foley which were  unsuccessful. Urology was consulted and placed16 french council foley catheter. Subsequently, he had mild hematuria which ultimately cleared. Urology recommended foley to continue on discharge with plans to be followed closely in outpatient clinic. He was discharged from the hospital 08/09/19.   He had f/u w/ urology 08/15/19 and foley removed. After returning home, he redeveloped urinary retention and went to ED next day. Foley cath reinserted.   S/P DC-CV 09/08/19 with restoration of NSR.   He had TURP in 6/21.   Cardiac MRI in 6/21 showed EF 35%, LGE pattern consistent with lateral infarction.   He saw Dr. Curt Bears and atrial fibrillation ablation was recommended. Zio patch was placed to assess PVC burden.   Patient returns today for followup of CHF.  He is generally feeling well.  He does get "wobbly" when he stands up.  He does not repeat lightheadedness, but feels like his balance is off.  No chest pain.  No orthopnea/PND.  No significant exertional dyspnea.  He is in NSR today on amiodarone.   Weight is up 4 lbs.   Labs (3/21): K 4.7, creatinine 1.6, TSH elevated, free T3/free T4 normal, LFTs normal.  Labs (5/21): LFTs normal, digoxin 0.8, TSH mildly elevated, free T3 and free T4 normal.  Labs (6/21): K 4.9 => 5.3, creatinine 1.77 => 1.65  ECG (personally reviewed): NSR, 1st degree AVB, IVCD 140 msec  PMH: 1. Atrial fibrillation: Paroxysmal.   - DCCV to NSR 3/21.  2. BPH with urinary retention - TURP 6/21 3. Type 2 diabetes  4. HTN 5. Nephrolithiasis 6. CKD stage 3 7. Chronic systolic CHF: Nonischemic cardiomyopathy.  - Echo (2/21): EF < 20%, with severely decreased RV systolic function. - LHC (2/21): No significant CAD - Cardiac MRI (6/21): EF 35%, LGE pattern consistent with lateral infarction.    Current Outpatient Medications  Medication Sig Dispense Refill  . amiodarone (PACERONE) 200 MG tablet Take 200 mg by mouth at bedtime.    [name] apixaban (ELIQUIS) 5 MG TABS tablet Take  1 tablet (5 mg total) by mouth 2 (two) times daily. 180 tablet 3  . atorvastatin (LIPITOR) 10 MG tablet Take 10 mg by mouth at bedtime.     . carvedilol (COREG) 6.25 MG tablet Take 1 tablet (6.25 mg total) by mouth 2 (two) times daily with a meal. 180 tablet 3  . digoxin (LANOXIN) 0.125 MG tablet Take 0.5 tablets (0.0625 mg total) by mouth daily. 45 tablet 3  . Multiple Vitamin (MULTIVITAMIN) tablet Take 1 tablet by mouth daily.    . sacubitril-valsartan (ENTRESTO) 24-26 MG Take 1 tablet by mouth 2 (two) times daily. 180 tablet 3  . spironolactone (ALDACTONE) 25 MG tablet Take 25 mg by mouth daily.    [name] torsemide (DEMADEX) 20 MG tablet Take 1 tablet (20 mg total) by mouth daily. 90 tablet 3  . traZODone (DESYREL) 50 MG tablet Take 50 mg by mouth at bedtime.    . dapagliflozin propanediol (FARXIGA) 10 MG TABS tablet Take 1 tablet (10 mg total) by mouth daily before breakfast. 30 tablet 3   No current facility-administered medications for this encounter.    No Known Allergies    Social History   Socioeconomic History  . Marital status: Divorced    Spouse name: Not on file  . Number of children: Not on file  . Years of education: Not on file  . Highest education level: Not on file  Occupational History  . Not on file  Tobacco Use  . Smoking status: Light Tobacco Smoker    Types: Cigars  . Smokeless tobacco: Current User    Types: Chew  . Tobacco comment: rarely chews tobacco cigar  Vaping Use  . Vaping Use: Never used  Substance and Sexual Activity  . Alcohol use: Yes    Alcohol/week: 1.0 standard drink    Types: 1 Cans of beer per week  . Drug use: No  . Sexual activity: Not Currently  Other Topics Concern  . Not on file  Social History Narrative  . Not on file   Social Determinants of Health   Financial Resource Strain:   . Difficulty of Paying Living Expenses:   Food Insecurity:   . Worried About Charity fundraiser in the Last Year:   . Arboriculturist in the Last  Year:   Transportation Needs:   . Film/video editor (Medical):   [name] Lack of Transportation (Non-Medical):   Physical Activity:   . Days of Exercise per Week:   . Minutes of Exercise per Session:   Stress:   . Feeling of Stress :   Social Connections:   . Frequency of Communication with Friends and Family:   . Frequency of Social Gatherings with Friends and Family:   . Attends Religious Services:   . Active Member of Clubs or Organizations:   . Attends Archivist Meetings:   [name] Marital Status:   Intimate Partner Violence:   . Fear of Current or Ex-Partner:   . Emotionally Abused:   .  Physically Abused:   . Sexually Abused:       Family History  Problem Relation Age of Onset  . Diabetes Mother   . Hypertension Mother   . Heart disease Mother   . Diabetes Father   . Hypertension Father   . Heart disease Father   . Stroke Father   . Colon cancer Neg Hx     Vitals:   01/08/20 1156  BP: (!) 110/62  Pulse: 60  SpO2: 97%  Weight: 86 kg (189 lb 9.6 oz)   Wt Readings from Last 3 Encounters:  01/08/20 86 kg (189 lb 9.6 oz)  12/13/19 84.7 kg (186 lb 12.8 oz)  12/05/19 84.4 kg (186 lb)     PHYSICAL EXAM: General: NAD Neck: No JVD, no thyromegaly or thyroid nodule.  Lungs: Clear to auscultation bilaterally with normal respiratory effort. CV: Nondisplaced PMI.  Heart regular S1/S2, no S3/S4, no murmur.  No peripheral edema.  No carotid bruit.  Normal pedal pulses.  Abdomen: Soft, nontender, no hepatosplenomegaly, no distention.  Skin: Intact without lesions or rashes.  Neurologic: Alert and oriented x 3.  Psych: Normal affect. Extremities: No clubbing or cyanosis.  HEENT: Normal.   ASSESSMENT & PLAN:  1. Chronic systolic CHF: Echo 5/40 with EF <20%, severely decreased RV function. Nonischemic cardiomyopathy, ?tachycardia-mediated due to atrial fibrillation of uncertain duration. No prior echo or history of CHF.He required milrinone during 2/21 admission  but was titrated off.  Coronary angiography in 2/21 with no significant coronary disease. Cardiac MRI in 6/21 with EF up to 35%, LGE pattern consistent with lateral MI (surprising given no significant CAD on cath).  NYHA class II symptoms, not volume overloaded on exam. Given "wobbly" feeling (?orthostasis), will avoid increasing BP-active meds today.  - Continue Coreg 6.25 mg bid.  - Continue torsemide 20 mg daily.  If creatinine higher BMET, may need to cut back.  - Continue digoxin 0.0625 daily, check level today.  - Continue spironolactone 25 mg daily.  - Continue Entresto 24-26 mg twice a day.  - Start dapagliflozin 10 mg daily with CKD and CHF, will get BMET today and in 10 days.  - Cut back on orange juice with borderline K elevation.  - Refer for cardiac rehab at [facility].  2. Atrial fibrillation: Paroxysmal.  Admitted with afib/RVR of uncertain duration 0/86. Unsure if atrial fibrillation/RVR was cause of CMP (tachy-mediated) or an effect of the cardiomyopathy.  TEE on 2/22 showed LA appendage thrombus. He was anticoagulated, then underwent DC-CV on 09/08/19 with conversion to NSR. He is in NSR today.  - Continue amiodarone, check LFTs/TSH today.  He will need regular eye exam while on amiodarone.   - Continue apixaban  - He has seen Dr. Curt Bears with plan for ablation, he is not sure about the date.  Hopefully, this will allow [place] to stop amiodarone.  3. CKD stage 3:  - Check BMET today.  4. BPH/urinary obstruction: Has had TURP.  5. PVCs: Zio patch x 1 week to quantify, results pending.   Followup in 3 months.   Loralie Champagne, MD 01/08/20

## 2020-01-09 ENCOUNTER — Telehealth (HOSPITAL_COMMUNITY): Payer: Self-pay

## 2020-01-09 MED ORDER — SPIRONOLACTONE 25 MG PO TABS: 12.5000 mg | ORAL_TABLET | Freq: Every day | ORAL | 5 refills | Status: DC

## 2020-01-09 NOTE — Telephone Encounter (Signed)
Pt aware of results and recommendations. Verbalized understanding. Pt will RTC in 1 week for lab work

## 2020-01-09 NOTE — Telephone Encounter (Signed)
-----   Message from Larey Dresser, MD sent at 01/08/2020  9:28 PM EDT ----- Increase po fluid intake, hold spironolactone for 2 days then decrease to 12.5 mg daily after that.  BMET in 1 week.

## 2020-01-10 ENCOUNTER — Encounter: Payer: Self-pay | Admitting: *Deleted

## 2020-01-10 ENCOUNTER — Telehealth: Payer: Self-pay | Admitting: *Deleted

## 2020-01-10 DIAGNOSIS — I4891 Unspecified atrial fibrillation: Secondary | ICD-10-CM

## 2020-01-10 NOTE — Telephone Encounter (Addendum)
Called pt to schedule ablation. Pt agreeable to 8/25 for AFib ablation. Covid screening scheduled for 8/23 in [place]. Pre procedure labs on 8/4 at PCP. CT & ablation instructions reviewed w/ pt, again, and printed for pt to pick up in [place] office. Aware office will call to arrange CT & post procedure follow up appts. Patient verbalized understanding and agreeable to plan.

## 2020-01-11 ENCOUNTER — Other Ambulatory Visit (HOSPITAL_COMMUNITY): Payer: Self-pay

## 2020-01-11 DIAGNOSIS — I5022 Chronic systolic (congestive) heart failure: Secondary | ICD-10-CM

## 2020-01-11 NOTE — Progress Notes (Signed)
Added cbc to patients labs so he wouldn't have to be stuck again

## 2020-01-13 DIAGNOSIS — I493 Ventricular premature depolarization: Secondary | ICD-10-CM | POA: Diagnosis not present

## 2020-01-15 ENCOUNTER — Ambulatory Visit (HOSPITAL_COMMUNITY)
Admission: RE | Admit: 2020-01-15 | Discharge: 2020-01-15 | Disposition: A | Discharge status: Home / Self Care | Payer: Medicare Other | Source: Ambulatory Visit | Attending: Cardiology | Admitting: Cardiology

## 2020-01-15 ENCOUNTER — Other Ambulatory Visit: Payer: Self-pay

## 2020-01-15 DIAGNOSIS — I480 Paroxysmal atrial fibrillation: Secondary | ICD-10-CM | POA: Diagnosis not present

## 2020-01-15 DIAGNOSIS — I5041 Acute combined systolic (congestive) and diastolic (congestive) heart failure: Secondary | ICD-10-CM | POA: Diagnosis not present

## 2020-01-15 DIAGNOSIS — I5022 Chronic systolic (congestive) heart failure: Secondary | ICD-10-CM

## 2020-01-15 LAB — BASIC METABOLIC PANEL
Anion gap: 12 (ref 5–15)
BUN: 38 mg/dL — ABNORMAL HIGH (ref 8–23)
CO2: 26 mmol/L (ref 22–32)
Calcium: 9.5 mg/dL (ref 8.9–10.3)
Chloride: 94 mmol/L — ABNORMAL LOW (ref 98–111)
Creatinine, Ser: 2.35 mg/dL — ABNORMAL HIGH (ref 0.61–1.24)
GFR calc Af Amer: 29 mL/min — ABNORMAL LOW (ref >60)
GFR calc non Af Amer: 25 mL/min — ABNORMAL LOW (ref >60)
Glucose, Bld: 274 mg/dL — ABNORMAL HIGH (ref 70–99)
Potassium: 5.1 mmol/L (ref 3.5–5.1)
Sodium: 132 mmol/L — ABNORMAL LOW (ref 135–145)

## 2020-01-15 LAB — CBC
HCT: 38.9 % — ABNORMAL LOW (ref 39.0–52.0)
Hemoglobin: 13.2 g/dL (ref 13.0–17.0)
MCH: 32.9 pg (ref 26.0–34.0)
MCHC: 33.9 g/dL (ref 30.0–36.0)
MCV: 97 fL (ref 80.0–100.0)
Platelets: 278 10*3/uL (ref 150–400)
RBC: 4.01 MIL/uL — ABNORMAL LOW (ref 4.22–5.81)
RDW: 11.9 % (ref 11.5–15.5)
WBC: 9.8 10*3/uL (ref 4.0–10.5)
nRBC: 0 % (ref 0.0–0.2)

## 2020-01-16 NOTE — Telephone Encounter (Signed)
Pt aware can go by the [place] office and pick up instructions Instructions are in pt's chart ./cy

## 2020-01-16 NOTE — Telephone Encounter (Signed)
Patient calling back stating he went to the [place] office and they did not have the paperwork for his procedure.

## 2020-01-17 ENCOUNTER — Telehealth (HOSPITAL_COMMUNITY): Payer: Self-pay | Admitting: Cardiology

## 2020-01-17 DIAGNOSIS — Z79899 Other long term (current) drug therapy: Secondary | ICD-10-CM | POA: Diagnosis not present

## 2020-01-17 DIAGNOSIS — E1129 Type 2 diabetes mellitus with other diabetic kidney complication: Secondary | ICD-10-CM | POA: Diagnosis not present

## 2020-01-17 DIAGNOSIS — I5022 Chronic systolic (congestive) heart failure: Secondary | ICD-10-CM | POA: Diagnosis not present

## 2020-01-17 DIAGNOSIS — I5041 Acute combined systolic (congestive) and diastolic (congestive) heart failure: Secondary | ICD-10-CM

## 2020-01-17 DIAGNOSIS — I4891 Unspecified atrial fibrillation: Secondary | ICD-10-CM | POA: Diagnosis not present

## 2020-01-17 NOTE — Telephone Encounter (Signed)
Pt aware and voiced understanding Repeat labs 8/9

## 2020-01-17 NOTE — Telephone Encounter (Signed)
-----   Message from Larey Dresser, MD sent at 01/16/2020  5:08 PM EDT ----- Sonia Baller for 1 day then restart.  Stop torsemide.  BMET 1 week.

## 2020-01-19 NOTE — Addendum Note (Signed)
Encounter addended by: Micki Riley, RN on: 01/19/2020 11:36 AM  Actions taken: Imaging Exam ended

## 2020-01-22 ENCOUNTER — Ambulatory Visit (HOSPITAL_COMMUNITY)
Admission: RE | Admit: 2020-01-22 | Discharge: 2020-01-22 | Disposition: A | Discharge status: Home / Self Care | Payer: Medicare Other | Source: Ambulatory Visit | Attending: Cardiology | Admitting: Cardiology

## 2020-01-22 ENCOUNTER — Other Ambulatory Visit: Payer: Self-pay

## 2020-01-22 DIAGNOSIS — I5041 Acute combined systolic (congestive) and diastolic (congestive) heart failure: Secondary | ICD-10-CM | POA: Diagnosis not present

## 2020-01-22 LAB — BASIC METABOLIC PANEL
Anion gap: 12 (ref 5–15)
BUN: 22 mg/dL (ref 8–23)
CO2: 20 mmol/L — ABNORMAL LOW (ref 22–32)
Calcium: 9.2 mg/dL (ref 8.9–10.3)
Chloride: 101 mmol/L (ref 98–111)
Creatinine, Ser: 1.42 mg/dL — ABNORMAL HIGH (ref 0.61–1.24)
GFR calc Af Amer: 53 mL/min — ABNORMAL LOW (ref >60)
GFR calc non Af Amer: 46 mL/min — ABNORMAL LOW (ref >60)
Glucose, Bld: 215 mg/dL — ABNORMAL HIGH (ref 70–99)
Potassium: 5.4 mmol/L — ABNORMAL HIGH (ref 3.5–5.1)
Sodium: 133 mmol/L — ABNORMAL LOW (ref 135–145)

## 2020-01-24 ENCOUNTER — Telehealth (HOSPITAL_COMMUNITY): Payer: Self-pay

## 2020-01-24 DIAGNOSIS — I5022 Chronic systolic (congestive) heart failure: Secondary | ICD-10-CM

## 2020-01-24 DIAGNOSIS — E1122 Type 2 diabetes mellitus with diabetic chronic kidney disease: Secondary | ICD-10-CM | POA: Diagnosis not present

## 2020-01-24 DIAGNOSIS — N184 Chronic kidney disease, stage 4 (severe): Secondary | ICD-10-CM | POA: Diagnosis not present

## 2020-01-24 MED ORDER — DAPAGLIFLOZIN PROPANEDIOL 10 MG PO TABS: 10.0000 mg | ORAL_TABLET | Freq: Every day | ORAL | 3 refills | Status: DC

## 2020-01-24 NOTE — Telephone Encounter (Signed)
Patient advised and verbalized understanding. Lab order entered, lab appt scheduled. Patient also requested that we send in a paper Rx for Wilder Glade to the [facility]. Rx sent to [phone]  Meds ordered this encounter  Medications  . dapagliflozin propanediol (FARXIGA) 10 MG TABS tablet    Sig: Take 1 tablet (10 mg total) by mouth daily before breakfast.    Dispense:  90 tablet    Refill:  3    Orders Placed This Encounter  Procedures  . Basic Metabolic Panel (BMET)    Standing Status:   Future    Standing Expiration Date:   01/23/2021    Order Specific Question:   Release to patient    Answer:   Immediate

## 2020-01-24 NOTE — Telephone Encounter (Signed)
-----   Message from Larey Dresser, MD sent at 01/23/2020  6:05 PM EDT ----- Creatinine much better.  K is high but may have been hemolyzed.  Would like him to get repeat BMET to recheck K.

## 2020-01-25 ENCOUNTER — Telehealth: Payer: Self-pay

## 2020-01-25 NOTE — Telephone Encounter (Signed)
Per review of most recent lab work-Pt is able to have cardiac CT prior to ablation.  Confirmed with nurse navigator.  No action required at this time.

## 2020-01-29 ENCOUNTER — Other Ambulatory Visit (HOSPITAL_COMMUNITY): Payer: Self-pay | Admitting: *Deleted

## 2020-01-29 ENCOUNTER — Other Ambulatory Visit: Payer: Self-pay

## 2020-01-29 ENCOUNTER — Ambulatory Visit (HOSPITAL_COMMUNITY)
Admission: RE | Admit: 2020-01-29 | Discharge: 2020-01-29 | Disposition: A | Discharge status: Home / Self Care | Payer: Medicare Other | Source: Ambulatory Visit | Attending: Internal Medicine | Admitting: Internal Medicine

## 2020-01-29 DIAGNOSIS — I5022 Chronic systolic (congestive) heart failure: Secondary | ICD-10-CM | POA: Diagnosis not present

## 2020-01-29 LAB — BASIC METABOLIC PANEL
Anion gap: 9 (ref 5–15)
BUN: 21 mg/dL (ref 8–23)
CO2: 21 mmol/L — ABNORMAL LOW (ref 22–32)
Calcium: 9.7 mg/dL (ref 8.9–10.3)
Chloride: 105 mmol/L (ref 98–111)
Creatinine, Ser: 1.46 mg/dL — ABNORMAL HIGH (ref 0.61–1.24)
GFR calc Af Amer: 52 mL/min — ABNORMAL LOW (ref >60)
GFR calc non Af Amer: 44 mL/min — ABNORMAL LOW (ref >60)
Glucose, Bld: 233 mg/dL — ABNORMAL HIGH (ref 70–99)
Potassium: 4.9 mmol/L (ref 3.5–5.1)
Sodium: 135 mmol/L (ref 135–145)

## 2020-01-30 ENCOUNTER — Telehealth (HOSPITAL_COMMUNITY): Payer: Self-pay | Admitting: Emergency Medicine

## 2020-01-30 NOTE — Telephone Encounter (Signed)
Received a phone call from the [facility] regarding a rx request for farxiga. Jannett Celestine stated she needs additional information from the ordering MD to process the request and left me her telephone number and fax number.   Jannett Celestine [phone] (tele) [phone] (fax)

## 2020-01-30 NOTE — Telephone Encounter (Signed)
Reaching out to patient to offer assistance regarding upcoming cardiac imaging study; pt verbalizes understanding of appt date/time, parking situation and where to check in, pre-test NPO status and medications ordered, and verified current allergies; name and call back number provided for further questions should they arise Marchia Bond RN Navigator Cardiac Imaging [facility] [phone] office [phone] cell  Infusion clinic appt @ 12

## 2020-01-30 NOTE — Telephone Encounter (Signed)
Please address.  Will need samples while waiting for [facility] probably.

## 2020-01-31 NOTE — Telephone Encounter (Signed)
Fax to [phone] failed called [phone] and they told me to call [phone]. Spoke with [place] and she gave me the fax number [phone] received a success from this fax number

## 2020-01-31 NOTE — Telephone Encounter (Signed)
Patient provided wrong fax number. Faxing it now to [phone]

## 2020-02-01 ENCOUNTER — Ambulatory Visit (HOSPITAL_COMMUNITY): Payer: Medicare Other

## 2020-02-01 ENCOUNTER — Ambulatory Visit (HOSPITAL_COMMUNITY)
Admission: RE | Admit: 2020-02-01 | Discharge: 2020-02-01 | Disposition: A | Discharge status: Home / Self Care | Payer: Medicare Other | Source: Ambulatory Visit | Attending: Cardiology | Admitting: Cardiology

## 2020-02-01 ENCOUNTER — Encounter (HOSPITAL_COMMUNITY)
Admission: RE | Admit: 2020-02-01 | Discharge: 2020-02-01 | Disposition: A | Discharge status: Home / Self Care | Payer: Medicare Other | Source: Ambulatory Visit | Attending: Cardiology | Admitting: Cardiology

## 2020-02-01 ENCOUNTER — Other Ambulatory Visit: Payer: Self-pay

## 2020-02-01 DIAGNOSIS — I5041 Acute combined systolic (congestive) and diastolic (congestive) heart failure: Secondary | ICD-10-CM | POA: Insufficient documentation

## 2020-02-01 DIAGNOSIS — I4891 Unspecified atrial fibrillation: Secondary | ICD-10-CM | POA: Diagnosis not present

## 2020-02-01 LAB — BASIC METABOLIC PANEL
Anion gap: 9 (ref 5–15)
BUN: 24 mg/dL — ABNORMAL HIGH (ref 8–23)
CO2: 23 mmol/L (ref 22–32)
Calcium: 9.1 mg/dL (ref 8.9–10.3)
Chloride: 102 mmol/L (ref 98–111)
Creatinine, Ser: 1.53 mg/dL — ABNORMAL HIGH (ref 0.61–1.24)
GFR calc Af Amer: 49 mL/min — ABNORMAL LOW (ref >60)
GFR calc non Af Amer: 42 mL/min — ABNORMAL LOW (ref >60)
Glucose, Bld: 168 mg/dL — ABNORMAL HIGH (ref 70–99)
Potassium: 5.1 mmol/L (ref 3.5–5.1)
Sodium: 134 mmol/L — ABNORMAL LOW (ref 135–145)

## 2020-02-01 MED ORDER — IOHEXOL 350 MG/ML SOLN
80.0000 mL | Freq: Once | INTRAVENOUS | Status: AC | PRN
  Administered 2020-02-01: 80 mL via INTRAVENOUS

## 2020-02-01 MED ORDER — SODIUM CHLORIDE 0.9 % WEIGHT BASED INFUSION
3.0000 mL/kg/h | INTRAVENOUS | Status: AC
  Administered 2020-02-01: 3 mL/kg/h via INTRAVENOUS

## 2020-02-01 MED ORDER — SODIUM CHLORIDE 0.9 % WEIGHT BASED INFUSION: 1.0000 mL/kg/h | INTRAVENOUS | Status: DC

## 2020-02-01 NOTE — Progress Notes (Signed)
Pt arrived to procedural short stay at 1518 from [facility] (infusion clinic). IV already infusing and programmed to beep when complete. Will discharge once fluids are finished.

## 2020-02-05 ENCOUNTER — Other Ambulatory Visit: Payer: Self-pay

## 2020-02-05 ENCOUNTER — Other Ambulatory Visit (HOSPITAL_COMMUNITY)
Admission: RE | Admit: 2020-02-05 | Discharge: 2020-02-05 | Disposition: A | Discharge status: Home / Self Care | Payer: Medicare Other | Source: Ambulatory Visit | Attending: Cardiology | Admitting: Cardiology

## 2020-02-05 DIAGNOSIS — Z01812 Encounter for preprocedural laboratory examination: Secondary | ICD-10-CM | POA: Diagnosis not present

## 2020-02-05 DIAGNOSIS — Z20822 Contact with and (suspected) exposure to covid-19: Secondary | ICD-10-CM | POA: Insufficient documentation

## 2020-02-05 LAB — SARS CORONAVIRUS 2 (TAT 6-24 HRS): SARS Coronavirus 2: NEGATIVE

## 2020-02-05 NOTE — Progress Notes (Signed)
Instructed patient on the following items: Arrival time 0930 Nothing to eat or drink after midnight No meds AM of procedure Responsible person to drive you home and stay with you for 24 hrs- yes  Have you missed any doses of anti-coagulant on eliquis, hasn't missed any doses

## 2020-02-07 ENCOUNTER — Encounter (HOSPITAL_COMMUNITY)
Admission: RE | Disposition: A | Discharge status: Home / Self Care | Payer: Medicare Other | Source: Home / Self Care | Attending: Cardiology

## 2020-02-07 ENCOUNTER — Ambulatory Visit (HOSPITAL_COMMUNITY): Payer: Medicare Other | Admitting: Anesthesiology

## 2020-02-07 ENCOUNTER — Other Ambulatory Visit: Payer: Self-pay

## 2020-02-07 ENCOUNTER — Ambulatory Visit (HOSPITAL_COMMUNITY)
Admission: RE | Admit: 2020-02-07 | Discharge: 2020-02-07 | Disposition: A | Discharge status: Home / Self Care | Payer: Medicare Other | Attending: Cardiology | Admitting: Cardiology

## 2020-02-07 DIAGNOSIS — I428 Other cardiomyopathies: Secondary | ICD-10-CM | POA: Insufficient documentation

## 2020-02-07 DIAGNOSIS — E785 Hyperlipidemia, unspecified: Secondary | ICD-10-CM | POA: Insufficient documentation

## 2020-02-07 DIAGNOSIS — I130 Hypertensive heart and chronic kidney disease with heart failure and stage 1 through stage 4 chronic kidney disease, or unspecified chronic kidney disease: Secondary | ICD-10-CM | POA: Insufficient documentation

## 2020-02-07 DIAGNOSIS — I4891 Unspecified atrial fibrillation: Secondary | ICD-10-CM | POA: Diagnosis not present

## 2020-02-07 DIAGNOSIS — E1122 Type 2 diabetes mellitus with diabetic chronic kidney disease: Secondary | ICD-10-CM | POA: Insufficient documentation

## 2020-02-07 DIAGNOSIS — E119 Type 2 diabetes mellitus without complications: Secondary | ICD-10-CM | POA: Diagnosis not present

## 2020-02-07 DIAGNOSIS — I4819 Other persistent atrial fibrillation: Secondary | ICD-10-CM | POA: Diagnosis not present

## 2020-02-07 DIAGNOSIS — I110 Hypertensive heart disease with heart failure: Secondary | ICD-10-CM | POA: Diagnosis not present

## 2020-02-07 DIAGNOSIS — N183 Chronic kidney disease, stage 3 unspecified: Secondary | ICD-10-CM | POA: Diagnosis not present

## 2020-02-07 DIAGNOSIS — F17290 Nicotine dependence, other tobacco product, uncomplicated: Secondary | ICD-10-CM | POA: Insufficient documentation

## 2020-02-07 DIAGNOSIS — I509 Heart failure, unspecified: Secondary | ICD-10-CM | POA: Insufficient documentation

## 2020-02-07 HISTORY — PX: ATRIAL FIBRILLATION ABLATION: EP1191

## 2020-02-07 LAB — GLUCOSE, CAPILLARY
Glucose-Capillary: 216 mg/dL — ABNORMAL HIGH (ref 70–99)
Glucose-Capillary: 137 mg/dL — ABNORMAL HIGH (ref 70–99)

## 2020-02-07 LAB — POCT ACTIVATED CLOTTING TIME
Activated Clotting Time: 318 seconds
Activated Clotting Time: 290 seconds

## 2020-02-07 SURGERY — ATRIAL FIBRILLATION ABLATION
Anesthesia: General

## 2020-02-07 MED ORDER — SUGAMMADEX SODIUM 200 MG/2ML IV SOLN
INTRAVENOUS | Status: DC | PRN
  Administered 2020-02-07: 200 mg via INTRAVENOUS

## 2020-02-07 MED ORDER — HEPARIN SODIUM (PORCINE) 1000 UNIT/ML IJ SOLN
INTRAMUSCULAR | Status: DC | PRN
  Administered 2020-02-07: 1000 [IU] via INTRAVENOUS

## 2020-02-07 MED ORDER — PROTAMINE SULFATE 10 MG/ML IV SOLN
INTRAVENOUS | Status: DC | PRN
  Administered 2020-02-07: 40 mg via INTRAVENOUS

## 2020-02-07 MED ORDER — ONDANSETRON HCL 4 MG/2ML IJ SOLN: 4.0000 mg | Freq: Four times a day (QID) | INTRAMUSCULAR | Status: DC | PRN

## 2020-02-07 MED ORDER — HEPARIN SODIUM (PORCINE) 1000 UNIT/ML IJ SOLN
INTRAMUSCULAR | Status: DC | PRN
  Administered 2020-02-07: 2000 [IU] via INTRAVENOUS
  Administered 2020-02-07: 4000 [IU] via INTRAVENOUS
  Administered 2020-02-07: 15000 [IU] via INTRAVENOUS

## 2020-02-07 MED ORDER — HEPARIN (PORCINE) IN NACL 1000-0.9 UT/500ML-% IV SOLN
INTRAVENOUS | Status: AC
  Filled 2020-02-07: qty 500

## 2020-02-07 MED ORDER — PROPOFOL 10 MG/ML IV BOLUS
INTRAVENOUS | Status: DC | PRN
  Administered 2020-02-07: 100 mg via INTRAVENOUS

## 2020-02-07 MED ORDER — PHENYLEPHRINE HCL-NACL 10-0.9 MG/250ML-% IV SOLN
INTRAVENOUS | Status: DC | PRN
  Administered 2020-02-07: 20 ug/min via INTRAVENOUS

## 2020-02-07 MED ORDER — PHENYLEPHRINE 40 MCG/ML (10ML) SYRINGE FOR IV PUSH (FOR BLOOD PRESSURE SUPPORT)
PREFILLED_SYRINGE | INTRAVENOUS | Status: DC | PRN
  Administered 2020-02-07 (×5): 80 ug via INTRAVENOUS

## 2020-02-07 MED ORDER — HEPARIN (PORCINE) IN NACL 1000-0.9 UT/500ML-% IV SOLN
INTRAVENOUS | Status: DC | PRN
  Administered 2020-02-07 (×5): 500 mL

## 2020-02-07 MED ORDER — ROCURONIUM BROMIDE 10 MG/ML (PF) SYRINGE
PREFILLED_SYRINGE | INTRAVENOUS | Status: DC | PRN
  Administered 2020-02-07: 40 mg via INTRAVENOUS

## 2020-02-07 MED ORDER — SODIUM CHLORIDE 0.9 % IV SOLN: 250.0000 mL | INTRAVENOUS | Status: DC | PRN

## 2020-02-07 MED ORDER — SODIUM CHLORIDE 0.9% FLUSH: 3.0000 mL | Freq: Two times a day (BID) | INTRAVENOUS | Status: DC

## 2020-02-07 MED ORDER — HEPARIN SODIUM (PORCINE) 1000 UNIT/ML IJ SOLN
INTRAMUSCULAR | Status: AC
  Filled 2020-02-07: qty 1

## 2020-02-07 MED ORDER — SODIUM CHLORIDE 0.9 % IV SOLN: INTRAVENOUS | Status: DC

## 2020-02-07 MED ORDER — DOBUTAMINE IN D5W 4-5 MG/ML-% IV SOLN
INTRAVENOUS | Status: AC
  Filled 2020-02-07: qty 250

## 2020-02-07 MED ORDER — DOBUTAMINE IN D5W 4-5 MG/ML-% IV SOLN
INTRAVENOUS | Status: DC | PRN
  Administered 2020-02-07: 20 ug/kg/min via INTRAVENOUS

## 2020-02-07 MED ORDER — SODIUM CHLORIDE 0.9% FLUSH: 3.0000 mL | INTRAVENOUS | Status: DC | PRN

## 2020-02-07 MED ORDER — ONDANSETRON HCL 4 MG/2ML IJ SOLN
INTRAMUSCULAR | Status: DC | PRN
  Administered 2020-02-07: 4 mg via INTRAVENOUS

## 2020-02-07 MED ORDER — LIDOCAINE 2% (20 MG/ML) 5 ML SYRINGE
INTRAMUSCULAR | Status: DC | PRN
  Administered 2020-02-07: 60 mg via INTRAVENOUS

## 2020-02-07 MED ORDER — ACETAMINOPHEN 325 MG PO TABS: 650.0000 mg | ORAL_TABLET | ORAL | Status: DC | PRN

## 2020-02-07 MED ORDER — FENTANYL CITRATE (PF) 250 MCG/5ML IJ SOLN
INTRAMUSCULAR | Status: DC | PRN
Start: 2020-02-07 — End: 2020-02-07
  Administered 2020-02-07 (×3): 50 ug via INTRAVENOUS

## 2020-02-07 SURGICAL SUPPLY — 24 items
BAG SNAP BAND KOVER 36X36 (MISCELLANEOUS) ×3 IMPLANT
BLANKET WARM UNDERBOD FULL ACC (MISCELLANEOUS) ×3 IMPLANT
CATH 8FR REPROCESSED SOUNDSTAR (CATHETERS) ×3 IMPLANT
CATH MAPPNG PENTARAY F 2-6-2MM (CATHETERS) ×1 IMPLANT
CATH S CIRCA THERM PROBE 10F (CATHETERS) ×3 IMPLANT
CATH SMTCH THERMOCOOL SF DF (CATHETERS) ×3 IMPLANT
CATH WEBSTER BI DIR CS D-F CRV (CATHETERS) ×3 IMPLANT
COVER SWIFTLINK CONNECTOR (BAG) ×3 IMPLANT
DEVICE CLOSURE PERCLS PRGLD 6F (VASCULAR PRODUCTS) ×4 IMPLANT
PACK EP LATEX FREE (CUSTOM PROCEDURE TRAY) ×3
PACK EP LF (CUSTOM PROCEDURE TRAY) ×1 IMPLANT
PAD PRO RADIOLUCENT 2001M-C (PAD) ×3 IMPLANT
PATCH CARTO3 (PAD) ×3 IMPLANT
PENTARAY F 2-6-2MM (CATHETERS) ×3
PERCLOSE PROGLIDE 6F (VASCULAR PRODUCTS) ×12
SHEATH BAYLIS SUREFLEX  M 8.5 (SHEATH) ×3
SHEATH BAYLIS SUREFLEX M 8.5 (SHEATH) ×1 IMPLANT
SHEATH BAYLIS TRANSSEPTAL 98CM (NEEDLE) ×3 IMPLANT
SHEATH CARTO VIZIGO SM CVD (SHEATH) ×3 IMPLANT
SHEATH PINNACLE 7F 10CM (SHEATH) ×3 IMPLANT
SHEATH PINNACLE 8F 10CM (SHEATH) ×6 IMPLANT
SHEATH PINNACLE 9F 10CM (SHEATH) ×3 IMPLANT
SHEATH PROBE COVER 6X72 (BAG) ×3 IMPLANT
TUBING SMART ABLATE COOLFLOW (TUBING) ×3 IMPLANT

## 2020-02-07 NOTE — H&P (Signed)
Electrophysiology Office Note   Date:  02/07/2020   ID:  Chris Guerrero, DOB January 01, 1941, MRN [id number]  PCP:  [name], MD  Cardiologist:  Aundra Dubin Primary Electrophysiologist:  Brandii Lakey Meredith Leeds, MD    Chief Complaint: AF   History of Present Illness: Chris Guerrero is a 79 y.o. male who is being seen today for the evaluation of AF at the request of No ref. provider found. Presenting today for electrophysiology evaluation.  He has a history of hypertension and hyperlipidemia.  He reported to [facility] with complaints of cough and dyspnea on exertion as well as progressive edema.  He has saw his PCP prior to coming to the hospital and was noted to be in rapid atrial fibrillation.  His BNP was 1068 at the time.  He was Covid negative.  He was started on diltiazem though he became hypotensive and was started on amiodarone.  An echo was done that showed an ejection fraction of less than 20% with diffuse hypokinesis and a severely decreased RV systolic function.  He required milrinone and IV amiodarone due to low output heart failure.  Right and left heart cath showed no coronary artery disease with mildly elevated filling pressures.  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  He had a repeat cardioversion 09/08/2019 with restoration of sinus rhythm.  Today, denies symptoms of palpitations, chest pain, shortness of breath, orthopnea, PND, lower extremity edema, claudication, dizziness, presyncope, syncope, bleeding, or neurologic sequela. The patient is tolerating medications without difficulties.     Past Medical History:  Diagnosis Date  . Atrial fibrillation ([place])   . BPH with obstruction/lower urinary tract symptoms   . CHF (congestive heart failure) ([place])   . CKD (chronic kidney disease), stage III   . Diabetes mellitus without complication ([place])   . History of kidney stones   . Hyperlipemia   . Hypertension   . Nonischemic  cardiomyopathy [facility])    Past Surgical History:  Procedure Laterality Date  . CARDIOVERSION N/A 09/08/2019   Procedure: CARDIOVERSION;  Surgeon: Larey Dresser, MD;  Location: [facility] ENDOSCOPY;  Service: Cardiovascular;  Laterality: N/A;  . CATARACT EXTRACTION W/ INTRAOCULAR LENS IMPLANT    . CHOLECYSTECTOMY    . COLONOSCOPY  09/04/2010   RMR: diverticula, cecal polyp = tubular adenoma; Repeat in 5 years  . COLONOSCOPY N/A 10/10/2015   Procedure: COLONOSCOPY;  Surgeon: Daneil Dolin, MD;  Location: [facility] ENDO SUITE;  Service: Endoscopy;  Laterality: N/A;  1300  . HERNIA REPAIR    . RIGHT/LEFT HEART CATH AND CORONARY ANGIOGRAPHY N/A 08/04/2019   Procedure: RIGHT/LEFT HEART CATH AND CORONARY ANGIOGRAPHY;  Surgeon: Larey Dresser, MD;  Location: [place] CV LAB;  Service: Cardiovascular;  Laterality: N/A;  . TEE WITHOUT CARDIOVERSION N/A 08/07/2019   Procedure: TRANSESOPHAGEAL ECHOCARDIOGRAM (TEE);  Surgeon: Larey Dresser, MD;  Location: [facility] ENDOSCOPY;  Service: Cardiovascular;  Laterality: N/A;  . TEE WITHOUT CARDIOVERSION N/A 09/08/2019   Procedure: TRANSESOPHAGEAL ECHOCARDIOGRAM (TEE);  Surgeon: Larey Dresser, MD;  Location: [facility] ENDOSCOPY;  Service: Cardiovascular;  Laterality: N/A;  . TRANSURETHRAL RESECTION OF PROSTATE N/A 11/27/2019   Procedure: TRANSURETHRAL RESECTION OF THE PROSTATE (TURP);  Surgeon: Kathie Rhodes, MD;  Location: [facility] ORS;  Service: Urology;  Laterality: N/A;     Current Facility-Administered Medications  Medication Dose Route Frequency Provider Last Rate Last Admin  . 0.9 %  sodium chloride infusion   Intravenous  Continuous Tris Howell, Ocie Doyne, MD        Allergies:   Patient has no known allergies.   Social History:  The patient  reports that he has been smoking cigars. His smokeless tobacco use includes chew. He reports current alcohol use of about 1.0 standard drink of alcohol per week. He reports that he does not use drugs.   Family History:  The patient's  family history includes Diabetes in his father and mother; Heart disease in his father and mother; Hypertension in his father and mother; Stroke in his father.    ROS:  Please see the history of present illness.   Otherwise, review of systems is positive for none.   All other systems are reviewed and negative.    PHYSICAL EXAM: VS:  BP (!) 112/57   Pulse 64   Temp (!) 97.5 F (36.4 C) (Oral)   Resp 18   Ht 5\' 9"  (1.753 m)   Wt 83.9 kg   SpO2 99%   BMI 27.32 kg/m  , BMI Body mass index is 27.32 kg/m. GEN: Well nourished, well developed, in no acute distress  HEENT: normal  Neck: no JVD, carotid bruits, or masses Cardiac: RRR; no murmurs, rubs, or gallops,no edema  Respiratory:  clear to auscultation bilaterally, normal work of breathing GI: soft, nontender, nondistended, + BS MS: no deformity or atrophy  Skin: warm and dry Neuro:  Strength and sensation are intact Psych: euthymic mood, full affect   Recent Labs: 08/07/2019: Magnesium 2.0 08/28/2019: B Natriuretic Peptide 362.0 01/08/2020: ALT 35; TSH 5.040 01/15/2020: Hemoglobin 13.2; Platelets 278 02/01/2020: BUN 24; Creatinine, Ser 1.53; Potassium 5.1; Sodium 134    Lipid Panel  No results found for: CHOL, TRIG, HDL, CHOLHDL, VLDL, LDLCALC, LDLDIRECT   Wt Readings from Last 3 Encounters:  02/07/20 83.9 kg  02/01/20 83.9 kg  01/08/20 86 kg      Other studies Reviewed: Additional studies/ records that were reviewed today include: RHC/LHC 08/04/19  Review of the above records today demonstrates:  1. Mildly elevated filling pressures.   2. Excellent cardiac output on milrinone.  3. No significant coronary disease.    TEE 09/20/19 1. Left ventricular ejection fraction, by estimation, is 25 to 30%. The  left ventricle has severely decreased function. The left ventricle  demonstrates global hypokinesis. The left ventricular internal cavity size  was mildly dilated.  2. Right ventricular systolic function is normal.  The right ventricular  size is mildly enlarged. There is normal pulmonary artery systolic  pressure.  3. There was a small PFO by color doppler. Left atrial size was  moderately dilated. No left atrial/left atrial appendage thrombus was  detected.  4. Right atrial size was moderately dilated.  5. The mitral valve is normal in structure. Trivial mitral valve  regurgitation. No evidence of mitral stenosis.  6. The aortic valve is tricuspid. Aortic valve regurgitation is not  visualized. No aortic stenosis is present.  7. Normal caliber thoracic aorta with grade 3 plaque in the descending  thoracic aorta.    ASSESSMENT AND PLAN:  1.  Persistent atrial fibrillation:  Chris Guerrero has presented today for surgery, with the diagnosis of persistent atrial fibrillation.  The various methods of treatment have been discussed with the patient and family. After consideration of risks, benefits and other options for treatment, the patient has consented to  Procedure(s): Catheter ablation as a surgical intervention .  Risks include but not limited to bleeding, vascular damage, tamponade, heart block,  stroke, damage to surrounding organs, among others. The patient's history has been reviewed, patient examined, no change in status, stable for surgery.  I have reviewed the patient's chart and labs.  Questions were answered to the patient's satisfaction.    Edson Deridder Curt Bears, MD 02/07/2020 11:18 AM

## 2020-02-07 NOTE — Progress Notes (Signed)
BP 89/40, HR 59 SB. Aldrete 10. No c/o discomfort. Bilateral groins level 0. Dr. Curt Bears paged and made aware. Good to go to SS.

## 2020-02-07 NOTE — Anesthesia Procedure Notes (Signed)
Procedure Name: Intubation Date/Time: 02/07/2020 11:57 AM Performed by: Amadeo Garnet, CRNA Pre-anesthesia Checklist: Patient identified, Emergency Drugs available, Suction available and Patient being monitored Patient Re-evaluated:Patient Re-evaluated prior to induction Oxygen Delivery Method: Circle system utilized Preoxygenation: Pre-oxygenation with 100% oxygen Induction Type: IV induction Ventilation: Mask ventilation without difficulty Laryngoscope Size: 3 Grade View: Grade I Tube type: Oral Tube size: 7.5 mm Number of attempts: 1 Airway Equipment and Method: Stylet Placement Confirmation: ETT inserted through vocal cords under direct vision,  positive ETCO2 and breath sounds checked- equal and bilateral Secured at: 22 cm Tube secured with: Tape Dental Injury: Teeth and Oropharynx as per pre-operative assessment

## 2020-02-07 NOTE — Anesthesia Postprocedure Evaluation (Signed)
Anesthesia Post Note  Patient: Chris Guerrero  Procedure(s) Performed: ATRIAL FIBRILLATION ABLATION (N/A )     Patient location during evaluation: PACU Anesthesia Type: General Level of consciousness: sedated Pain management: pain level controlled Vital Signs Assessment: post-procedure vital signs reviewed and stable Respiratory status: spontaneous breathing and respiratory function stable Cardiovascular status: stable Postop Assessment: no apparent nausea or vomiting Anesthetic complications: no   No complications documented.  Last Vitals:  Vitals:   02/07/20 1450 02/07/20 1500  BP: (!) 85/38 (!) 95/41  Pulse: (!) 52 (!) 56  Resp: 14 14  Temp:  36.8 C  SpO2: 95% 95%    Last Pain:  Vitals:   02/07/20 1500  TempSrc: Temporal  PainSc: 0-No pain                 Kimela Malstrom DANIEL

## 2020-02-07 NOTE — Progress Notes (Signed)
Up and walked and tolerated well; right groin with sm amt blood noted on dressing and dressing changed and client walked again; no further blood noted; no hematoma or bleeding either groin

## 2020-02-07 NOTE — Anesthesia Preprocedure Evaluation (Addendum)
Anesthesia Evaluation  Patient identified by MRN, date of birth, ID band Patient awake    Reviewed: Allergy & Precautions, NPO status , Patient's Chart, lab work & pertinent test results  History of Anesthesia Complications Negative for: history of anesthetic complications  Airway Mallampati: II  TM Distance: >3 FB Neck ROM: Full    Dental no notable dental hx. (+) Dental Advisory Given   Pulmonary former smoker,    Pulmonary exam normal        Cardiovascular hypertension, + dysrhythmias Atrial Fibrillation  Rhythm:Regular Rate:Normal  IMPRESSION: 1. There is normal pulmonary vein drainage into the left atrium with ostial measurements above.  2. There is no thrombus in the left atrial appendage.  3. The esophagus runs in close proximity to the left inferior pulmonary vein ostium.  4. A small PFO is present.  5. Normal coronary origin. Right dominance.  6. CAC score of 43 which is 12th percentile for age- and sex-matched controls. No CAD in the proximal segments.  [place] T. O'Neal, MD  IMPRESSIONS    1. Left ventricular ejection fraction, by estimation, is 25 to 30%. The  left ventricle has severely decreased function. The left ventricle  demonstrates global hypokinesis. The left ventricular internal cavity size  was mildly dilated.  2. Right ventricular systolic function is normal. The right ventricular  size is mildly enlarged. There is normal pulmonary artery systolic  pressure.  3. There was a small PFO by color doppler. Left atrial size was  moderately dilated. No left atrial/left atrial appendage thrombus was  detected.  4. Right atrial size was moderately dilated.  5. The mitral valve is normal in structure. Trivial mitral valve  regurgitation. No evidence of mitral stenosis.  6. The aortic valve is tricuspid. Aortic valve regurgitation is not  visualized. No aortic stenosis is present.  7. Normal  caliber thoracic aorta with grade 3 plaque in the descending  thoracic aorta.    Neuro/Psych negative neurological ROS  negative psych ROS   GI/Hepatic negative GI ROS, Neg liver ROS,   Endo/Other  negative endocrine ROSdiabetes  Renal/GU Renal InsufficiencyRenal disease  negative genitourinary   Musculoskeletal negative musculoskeletal ROS (+)   Abdominal   Peds negative pediatric ROS (+)  Hematology negative hematology ROS (+)   Anesthesia Other Findings   Reproductive/Obstetrics negative OB ROS                            Anesthesia Physical Anesthesia Plan  ASA: III  Anesthesia Plan: General   Post-op Pain Management:    Induction: Intravenous  PONV Risk Score and Plan: 1 and Ondansetron and Dexamethasone  Airway Management Planned: LMA and Oral ETT  Additional Equipment:   Intra-op Plan:   Post-operative Plan: Extubation in OR  Informed Consent: I have reviewed the patients History and Physical, chart, labs and discussed the procedure including the risks, benefits and alternatives for the proposed anesthesia with the patient or authorized representative who has indicated his/her understanding and acceptance.     Dental advisory given  Plan Discussed with: CRNA and Anesthesiologist  Anesthesia Plan Comments:         Anesthesia Quick Evaluation

## 2020-02-07 NOTE — Discharge Instructions (Signed)
Cardiac Ablation, Care After This sheet gives you information about how to care for yourself after your procedure. Your health care provider may also give you more specific instructions. If you have problems or questions, contact your health care provider. What can I expect after the procedure? After the procedure, it is common to have:  Bruising around your puncture site.  Tenderness around your puncture site.  Skipped heartbeats.  Tiredness (fatigue). Follow these instructions at home: Puncture site care   Follow instructions from your health care provider about how to take care of your puncture site. Make sure you: ? Wash your hands with soap and water before you change your bandage (dressing). If soap and water are not available, use hand sanitizer. ? Change your dressing as told by your health care provider. ? Leave stitches (sutures), skin glue, or adhesive strips in place. These skin closures may need to stay in place for up to 2 weeks. If adhesive strip edges start to loosen and curl up, you may trim the loose edges. Do not remove adhesive strips completely unless your health care provider tells you to do that.  Check your puncture site every day for signs of infection. Check for: ? Redness, swelling, or pain. ? Fluid or blood. If your puncture site starts to bleed, lie down on your back, apply firm pressure to the area, and contact your health care provider. ? Warmth. ? Pus or a bad smell. Driving  Ask your health care provider when it is safe for you to drive again after the procedure.  Do not drive or use heavy machinery while taking prescription pain medicine.  Do not drive for 24 hours if you were given a medicine to help you relax (sedative) during your procedure. Activity  Avoid activities that take a lot of effort for at least 3 days after your procedure.  Do not lift anything that is heavier than 10 lb (4.5 kg), or the limit that you are told, until your health  care provider says that it is safe.  Return to your normal activities as told by your health care provider. Ask your health care provider what activities are safe for you. General instructions  Take over-the-counter and prescription medicines only as told by your health care provider.  Do not use any products that contain nicotine or tobacco, such as cigarettes and e-cigarettes. If you need help quitting, ask your health care provider.  Do not take baths, swim, or use a hot tub until your health care provider approves.  Do not drink alcohol for 24 hours after your procedure.  Keep all follow-up visits as told by your health care provider. This is important. Contact a health care provider if:  You have redness, mild swelling, or pain around your puncture site.  You have fluid or blood coming from your puncture site that stops after applying firm pressure to the area.  Your puncture site feels warm to the touch.  You have pus or a bad smell coming from your puncture site.  You have a fever.  You have chest pain or discomfort that spreads to your neck, jaw, or arm.  You are sweating a lot.  You feel nauseous.  You have a fast or irregular heartbeat.  You have shortness of breath.  You are dizzy or light-headed and feel the need to lie down.  You have pain or numbness in the arm or leg closest to your puncture site. Get help right away if:  Your puncture   site suddenly swells.  Your puncture site is bleeding and the bleeding does not stop after applying firm pressure to the area. These symptoms may represent a serious problem that is an emergency. Do not wait to see if the symptoms will go away. Get medical help right away. Call your local emergency services (911 in the [place] Do not drive yourself to the hospital. Summary  After the procedure, it is normal to have bruising and tenderness at the puncture site in your groin, neck, or forearm.  Check your puncture site every  day for signs of infection.  Get help right away if your puncture site is bleeding and the bleeding does not stop after applying firm pressure to the area. This is a medical emergency. This information is not intended to replace advice given to you by your health care provider. Make sure you discuss any questions you have with your health care provider. Document Revised: 05/14/2017 Document Reviewed: 09/10/2016 Elsevier Patient Education  [place] procedure care instructions No driving for 4 days. No lifting over 5 lbs for 1 week. No vigorous or sexual activity for 1 week. You may return to work/your usual activities on 02/14/2020. Keep procedure site clean & dry. If you notice increased pain, swelling, bleeding or pus, call/return!  You may shower, but no soaking baths/hot tubs/pools for 1 week.     You have an appointment set up with the [facility].  Multiple studies have shown that being followed by a dedicated atrial fibrillation clinic in addition to the standard care you receive from your other physicians improves health. We believe that enrollment in the atrial fibrillation clinic will allow [place] to better care for you.   The phone number to the [facility] is [phone]. The clinic is staffed Monday through Friday from 8:30am to 5pm.  Parking Directions: The clinic is located in the Heart and Vascular Building connected to [facility]. 1)From [address] turn on to [address] and go to the 3rd entrance  (Heart and Vascular entrance) on the right. 2)Look to the right for Heart &Vascular Parking Garage. 3)A code for the entrance is required, for Sept is 4007.   4)Take the elevators to the 1st floor. Registration is in the room with the glass walls at the end of the hallway.  If you have any trouble parking or locating the clinic, please don't hesitate to call [phone].

## 2020-02-07 NOTE — Transfer of Care (Signed)
Immediate Anesthesia Transfer of Care Note  Patient: JAHMIL MACLEOD  Procedure(s) Performed: ATRIAL FIBRILLATION ABLATION (N/A )  Patient Location: Cath Lab  Anesthesia Type:General  Level of Consciousness: awake, alert  and oriented  Airway & Oxygen Therapy: Patient Spontanous Breathing  Post-op Assessment: Report given to RN, Post -op Vital signs reviewed and stable and Patient moving all extremities  Post vital signs: Reviewed and stable  Last Vitals:  Vitals Value Taken Time  BP 95/46 02/07/20 1429  Temp    Pulse 56 02/07/20 1431  Resp 14 02/07/20 1431  SpO2 94 % 02/07/20 1431  Vitals shown include unvalidated device data.  Last Pain:  Vitals:   02/07/20 0944  TempSrc:   PainSc: 0-No pain         Complications: No complications documented.

## 2020-02-08 ENCOUNTER — Telehealth: Payer: Self-pay | Admitting: Cardiology

## 2020-02-08 ENCOUNTER — Encounter (HOSPITAL_COMMUNITY): Payer: Self-pay | Admitting: Cardiology

## 2020-02-08 NOTE — Telephone Encounter (Signed)
Pt advised ok to remove dsg after 24 hours post procedure. Pt appreciates the return call

## 2020-02-08 NOTE — Telephone Encounter (Signed)
Patient called stating he had an ablation done yesterday, he wants to know if he can removed the dressing today.

## 2020-03-04 DIAGNOSIS — Z23 Encounter for immunization: Secondary | ICD-10-CM | POA: Diagnosis not present

## 2020-03-04 DIAGNOSIS — R3915 Urgency of urination: Secondary | ICD-10-CM | POA: Diagnosis not present

## 2020-03-06 ENCOUNTER — Encounter (HOSPITAL_COMMUNITY): Payer: Self-pay | Admitting: Nurse Practitioner

## 2020-03-06 ENCOUNTER — Other Ambulatory Visit: Payer: Self-pay

## 2020-03-06 ENCOUNTER — Ambulatory Visit (HOSPITAL_COMMUNITY)
Admission: RE | Admit: 2020-03-06 | Discharge: 2020-03-06 | Disposition: A | Discharge status: Home / Self Care | Payer: Medicare Other | Source: Ambulatory Visit | Attending: Nurse Practitioner | Admitting: Nurse Practitioner

## 2020-03-06 VITALS — BP 124/58 | HR 56 | Ht 69.0 in | Wt 186.4 lb

## 2020-03-06 DIAGNOSIS — Z79899 Other long term (current) drug therapy: Secondary | ICD-10-CM | POA: Insufficient documentation

## 2020-03-06 DIAGNOSIS — Z7901 Long term (current) use of anticoagulants: Secondary | ICD-10-CM | POA: Diagnosis not present

## 2020-03-06 DIAGNOSIS — E785 Hyperlipidemia, unspecified: Secondary | ICD-10-CM | POA: Insufficient documentation

## 2020-03-06 DIAGNOSIS — I4891 Unspecified atrial fibrillation: Secondary | ICD-10-CM | POA: Insufficient documentation

## 2020-03-06 DIAGNOSIS — I509 Heart failure, unspecified: Secondary | ICD-10-CM | POA: Insufficient documentation

## 2020-03-06 DIAGNOSIS — N183 Chronic kidney disease, stage 3 unspecified: Secondary | ICD-10-CM | POA: Diagnosis not present

## 2020-03-06 DIAGNOSIS — F17210 Nicotine dependence, cigarettes, uncomplicated: Secondary | ICD-10-CM | POA: Insufficient documentation

## 2020-03-06 DIAGNOSIS — Z8249 Family history of ischemic heart disease and other diseases of the circulatory system: Secondary | ICD-10-CM | POA: Insufficient documentation

## 2020-03-06 DIAGNOSIS — I428 Other cardiomyopathies: Secondary | ICD-10-CM | POA: Insufficient documentation

## 2020-03-06 DIAGNOSIS — E1122 Type 2 diabetes mellitus with diabetic chronic kidney disease: Secondary | ICD-10-CM | POA: Diagnosis not present

## 2020-03-06 DIAGNOSIS — D6869 Other thrombophilia: Secondary | ICD-10-CM | POA: Diagnosis not present

## 2020-03-06 DIAGNOSIS — I130 Hypertensive heart and chronic kidney disease with heart failure and stage 1 through stage 4 chronic kidney disease, or unspecified chronic kidney disease: Secondary | ICD-10-CM | POA: Insufficient documentation

## 2020-03-06 MED ORDER — SACUBITRIL-VALSARTAN 24-26 MG PO TABS: 0.5000 | ORAL_TABLET | Freq: Two times a day (BID) | ORAL | Status: DC

## 2020-03-06 NOTE — Progress Notes (Signed)
Primary Care Physician: [name], MD Referring Physician: Dr. Curt Bears  HF: Dr. Oneita Kras is a 79 y.o. male with a h/o afib, CHF, HTN, that is in the afib clinic for one month f/u afib ablation. He feels well. No afib to mention. He continues on amiodarone 200 mg daily and  eliquis 5 mg  for a CHA2DS2VASc score of  4. No swallowing or groin issues s/p ablation.   Today, he denies symptoms of palpitations, chest pain, shortness of breath, orthopnea, PND, lower extremity edema, dizziness, presyncope, syncope, or neurologic sequela. The patient is tolerating medications without difficulties and is otherwise without complaint today.   Past Medical History:  Diagnosis Date  . Atrial fibrillation ([place])   . BPH with obstruction/lower urinary tract symptoms   . CHF (congestive heart failure) ([place])   . CKD (chronic kidney disease), stage III   . Diabetes mellitus without complication ([place])   . History of kidney stones   . Hyperlipemia   . Hypertension   . Nonischemic cardiomyopathy [facility])    Past Surgical History:  Procedure Laterality Date  . ATRIAL FIBRILLATION ABLATION N/A 02/07/2020   Procedure: ATRIAL FIBRILLATION ABLATION;  Surgeon: Constance Haw, MD;  Location: [place] CV LAB;  Service: Cardiovascular;  Laterality: N/A;  . CARDIOVERSION N/A 09/08/2019   Procedure: CARDIOVERSION;  Surgeon: Larey Dresser, MD;  Location: [facility] ENDOSCOPY;  Service: Cardiovascular;  Laterality: N/A;  . CATARACT EXTRACTION W/ INTRAOCULAR LENS IMPLANT    . CHOLECYSTECTOMY    . COLONOSCOPY  09/04/2010   RMR: diverticula, cecal polyp = tubular adenoma; Repeat in 5 years  . COLONOSCOPY N/A 10/10/2015   Procedure: COLONOSCOPY;  Surgeon: Daneil Dolin, MD;  Location: [facility] ENDO SUITE;  Service: Endoscopy;  Laterality: N/A;  1300  . HERNIA REPAIR    . RIGHT/LEFT HEART CATH AND CORONARY ANGIOGRAPHY N/A 08/04/2019   Procedure: RIGHT/LEFT HEART CATH AND CORONARY ANGIOGRAPHY;  Surgeon: Larey Dresser, MD;  Location: [place] CV LAB;  Service: Cardiovascular;  Laterality: N/A;  . TEE WITHOUT CARDIOVERSION N/A 08/07/2019   Procedure: TRANSESOPHAGEAL ECHOCARDIOGRAM (TEE);  Surgeon: Larey Dresser, MD;  Location: [facility] ENDOSCOPY;  Service: Cardiovascular;  Laterality: N/A;  . TEE WITHOUT CARDIOVERSION N/A 09/08/2019   Procedure: TRANSESOPHAGEAL ECHOCARDIOGRAM (TEE);  Surgeon: Larey Dresser, MD;  Location: [facility] ENDOSCOPY;  Service: Cardiovascular;  Laterality: N/A;  . TRANSURETHRAL RESECTION OF PROSTATE N/A 11/27/2019   Procedure: TRANSURETHRAL RESECTION OF THE PROSTATE (TURP);  Surgeon: Kathie Rhodes, MD;  Location: [facility] ORS;  Service: Urology;  Laterality: N/A;    Current Outpatient Medications  Medication Sig Dispense Refill  . amiodarone (PACERONE) 200 MG tablet Take 200 mg by mouth at bedtime.    [name] apixaban (ELIQUIS) 5 MG TABS tablet Take 1 tablet (5 mg total) by mouth 2 (two) times daily. 180 tablet 3  . atorvastatin (LIPITOR) 10 MG tablet Take 10 mg by mouth at bedtime.     . carvedilol (COREG) 6.25 MG tablet Take 1 tablet (6.25 mg total) by mouth 2 (two) times daily with a meal. 180 tablet 3  . digoxin (LANOXIN) 0.125 MG tablet Take 0.5 tablets (0.0625 mg total) by mouth daily. (Patient taking differently: Take 0.0625 mg by mouth every evening. ) 45 tablet 3  . empagliflozin (JARDIANCE) 25 MG TABS tablet Take 25 mg by mouth daily.    . Multiple Vitamin (MULTIVITAMIN WITH MINERALS) TABS tablet Take 1 tablet by mouth daily.    . sacubitril-valsartan (  ENTRESTO) 24-26 MG Take 0.5 tablets by mouth 2 (two) times daily.    [name] spironolactone (ALDACTONE) 25 MG tablet Take 0.5 tablets (12.5 mg total) by mouth daily. 15 tablet 5  . traZODone (DESYREL) 50 MG tablet Take 50 mg by mouth at bedtime.     No current facility-administered medications for this encounter.    No Known Allergies  Social History   Socioeconomic History  . Marital status: Divorced    Spouse name: Not on file  .  Number of children: Not on file  . Years of education: Not on file  . Highest education level: Not on file  Occupational History  . Not on file  Tobacco Use  . Smoking status: Light Tobacco Smoker    Types: Cigars  . Smokeless tobacco: Current User    Types: Chew  . Tobacco comment: rarely chews tobacco cigar  Vaping Use  . Vaping Use: Never used  Substance and Sexual Activity  . Alcohol use: Yes    Alcohol/week: 1.0 standard drink    Types: 1 Cans of beer per week  . Drug use: No  . Sexual activity: Not Currently  Other Topics Concern  . Not on file  Social History Narrative  . Not on file   Social Determinants of Health   Financial Resource Strain:   . Difficulty of Paying Living Expenses: Not on file  Food Insecurity:   . Worried About Charity fundraiser in the Last Year: Not on file  . Ran Out of Food in the Last Year: Not on file  Transportation Needs:   . Lack of Transportation (Medical): Not on file  . Lack of Transportation (Non-Medical): Not on file  Physical Activity:   . Days of Exercise per Week: Not on file  . Minutes of Exercise per Session: Not on file  Stress:   . Feeling of Stress : Not on file  Social Connections:   . Frequency of Communication with Friends and Family: Not on file  . Frequency of Social Gatherings with Friends and Family: Not on file  . Attends Religious Services: Not on file  . Active Member of Clubs or Organizations: Not on file  . Attends Archivist Meetings: Not on file  . Marital Status: Not on file  Intimate Partner Violence:   . Fear of Current or Ex-Partner: Not on file  . Emotionally Abused: Not on file  . Physically Abused: Not on file  . Sexually Abused: Not on file    Family History  Problem Relation Age of Onset  . Diabetes Mother   . Hypertension Mother   . Heart disease Mother   . Diabetes Father   . Hypertension Father   . Heart disease Father   . Stroke Father   . Colon cancer Neg Hx      ROS- All systems are reviewed and negative except as per the HPI above  Physical Exam: Vitals:   03/06/20 1035  BP: (!) 124/58  Pulse: (!) 56  Weight: 84.6 kg  Height: 5\' 9"  (1.753 m)   Wt Readings from Last 3 Encounters:  03/06/20 84.6 kg  02/07/20 83.9 kg  02/01/20 83.9 kg    Labs: Lab Results  Component Value Date   NA 134 (L) 02/01/2020   K 5.1 02/01/2020   CL 102 02/01/2020   CO2 23 02/01/2020   GLUCOSE 168 (H) 02/01/2020   BUN 24 (H) 02/01/2020   CREATININE 1.53 (H) 02/01/2020   CALCIUM 9.1  02/01/2020   PHOS 4.0 08/02/2019   MG 2.0 08/07/2019   Lab Results  Component Value Date   INR 1.4 (H) 08/13/2019   No results found for: CHOL, HDL, LDLCALC, TRIG   GEN- The patient is well appearing, alert and oriented x 3 today.   Head- normocephalic, atraumatic Eyes-  Sclera clear, conjunctiva pink Ears- hearing intact Oropharynx- clear Neck- supple, no JVP Lymph- no cervical lymphadenopathy Lungs- Clear to ausculation bilaterally, normal work of breathing Heart- Regular rate and rhythm, no murmurs, rubs or gallops, PMI not laterally displaced GI- soft, NT, ND, + BS Extremities- no clubbing, cyanosis, or edema MS- no significant deformity or atrophy Skin- no rash or lesion Psych- euthymic mood, full affect Neuro- strength and sensation are intact  EKG-Sinus brady at 56 bpm, pr int 200 ms, qrs int 100 ms, qtc 420 ms     Assessment and Plan: 1. Afib Is doing well s/p ablation maintaining  SR  Continue amiodarone at 200 mg daily Told pt that I anticipated Dr. Curt Bears would stop this at his f/u appointment in November Continue carvedilol 6.25 mg bid and digoxin 0.125 mg bid    2. CHA2DS2VASc score of 4 Continue  eliquis 5 mg bid   3. CHF Stable  Per Dr. Aundra Dubin   F/u with Dr. Curt Bears 05/13/20  Geroge Baseman. Ciclaly [place], [facility] [address] [place],  [id number] [phone]

## 2020-03-14 NOTE — Addendum Note (Signed)
Encounter addended by: Sherran Needs, NP on: 03/14/2020 9:15 AM  Actions taken: Level of Service modified

## 2020-04-09 ENCOUNTER — Ambulatory Visit (HOSPITAL_COMMUNITY)
Admission: RE | Admit: 2020-04-09 | Discharge: 2020-04-09 | Disposition: A | Discharge status: Home / Self Care | Payer: Medicare Other | Source: Ambulatory Visit | Attending: Cardiology | Admitting: Cardiology

## 2020-04-09 ENCOUNTER — Encounter (HOSPITAL_COMMUNITY): Payer: Self-pay | Admitting: Cardiology

## 2020-04-09 ENCOUNTER — Other Ambulatory Visit: Payer: Self-pay

## 2020-04-09 VITALS — BP 138/70 | HR 64 | Wt 191.4 lb

## 2020-04-09 DIAGNOSIS — I480 Paroxysmal atrial fibrillation: Secondary | ICD-10-CM | POA: Diagnosis not present

## 2020-04-09 DIAGNOSIS — I5042 Chronic combined systolic (congestive) and diastolic (congestive) heart failure: Secondary | ICD-10-CM | POA: Diagnosis not present

## 2020-04-09 DIAGNOSIS — I493 Ventricular premature depolarization: Secondary | ICD-10-CM | POA: Diagnosis not present

## 2020-04-09 DIAGNOSIS — I428 Other cardiomyopathies: Secondary | ICD-10-CM

## 2020-04-09 DIAGNOSIS — I5022 Chronic systolic (congestive) heart failure: Secondary | ICD-10-CM | POA: Diagnosis not present

## 2020-04-09 DIAGNOSIS — Z79899 Other long term (current) drug therapy: Secondary | ICD-10-CM | POA: Diagnosis not present

## 2020-04-09 DIAGNOSIS — N183 Chronic kidney disease, stage 3 unspecified: Secondary | ICD-10-CM | POA: Insufficient documentation

## 2020-04-09 DIAGNOSIS — Z7901 Long term (current) use of anticoagulants: Secondary | ICD-10-CM | POA: Insufficient documentation

## 2020-04-09 DIAGNOSIS — N1831 Chronic kidney disease, stage 3a: Secondary | ICD-10-CM | POA: Diagnosis not present

## 2020-04-09 LAB — BASIC METABOLIC PANEL
Anion gap: 11 (ref 5–15)
BUN: 31 mg/dL — ABNORMAL HIGH (ref 8–23)
CO2: 23 mmol/L (ref 22–32)
Calcium: 9.7 mg/dL (ref 8.9–10.3)
Chloride: 103 mmol/L (ref 98–111)
Creatinine, Ser: 1.34 mg/dL — ABNORMAL HIGH (ref 0.61–1.24)
GFR, Estimated: 53 mL/min — ABNORMAL LOW (ref >60)
Glucose, Bld: 237 mg/dL — ABNORMAL HIGH (ref 70–99)
Potassium: 4.6 mmol/L (ref 3.5–5.1)
Sodium: 137 mmol/L (ref 135–145)

## 2020-04-09 MED ORDER — APIXABAN 5 MG PO TABS
5.0000 mg | ORAL_TABLET | Freq: Two times a day (BID) | ORAL | 3 refills | Status: DC
Start: 2020-04-09 — End: 2020-07-08

## 2020-04-09 NOTE — Progress Notes (Signed)
Medication Samples have been provided to the patient.  Drug name: Eliquis       Strength: 5mg         Qty: 3  LOT: [id number]  Exp.Date: 8/23  Dosing instructions: Take 1 tab Twice daily   The patient has been instructed regarding the correct time, dose, and frequency of taking this medication, including desired effects and most common side effects.   Chris Guerrero 10:27 AM 04/09/2020

## 2020-04-09 NOTE — Progress Notes (Signed)
t  Advanced Heart Failure Clinic Note    PCP: [name], MD PCP-Cardiologist: Kate Sable, MD (Inactive)  AHF: Dr. Aundra Dubin  Urology: Dr Karsten Ro   HPI: 79 y.o. male w/prior history of HTN and hyperlipidemia, who initially reported to [facility] w/ complaints of about 1 month of cough and dyspnea with exertion as well as progressive edema. No chest pain. He saw his PCPprior to coming [facility] and was noted to be in atrial fibrillation with RVR and he was then sent to [facility] for admission. Hs-TnI was flat at 25 =>27. BNP elevated, 1,068.COVID negative.He was initially tried on diltiazem gtt but became hypotensiveand changed toamiodarone gtt. Echo was done, showing EF <20% with diffuse hypokinesis, severely decreased RV function.He was transferred to [facility] on 2/18for HF work up and cath.   On arrival to [facility], he was found to be volume overloaded w/ soft SBPs and rising SCr up to 1.9 with attempts at diuresis, concerning for low output. PICC line was placed to measure co-ox and CVP. Initial co-ox low at 46%. He was started on milrinone and IV Lasix. IV amiodarone continued for afib + IV heparin.RHC/LHC 08/04/19 with no coronary disease, mildly elevated filling pressures.Co-ox improved w/ milrinone. He was ultimately weaned off milrinone and co-ox remained stable. He diuresed well w/ IV Lasix and later changed to torsemide. SCr improved w/ diuresis, down to 1.32.   He was set up for TEE/DCCV on 08/07/19 and found to have LAA thrombus on TEE. DCCV canceled. IV amiodarone was discontinued and he was changed to PO, 200 mg bid. Placed on Eliquis 5 bid.ASA discontinued.Plan to repeat TEE/DCCV after 1 month of chronic anticoagulation. He was advised to continue amiodarone 200 mg bid x 7 days, followed by 200 mg daily.   Also of note, he develop urinary retention due to BPH. Had over 500cc on bladder scan.Multiple attempts were made by the nursing staff to place a foley which were  unsuccessful. Urology was consulted and placed16 french council foley catheter. Subsequently, he had mild hematuria which ultimately cleared. Urology recommended foley to continue on discharge with plans to be followed closely in outpatient clinic. He was discharged from the hospital 08/09/19.   He had f/u w/ urology 08/15/19 and foley removed. After returning home, he redeveloped urinary retention and went to ED next day. Foley cath reinserted.   S/P DC-CV 09/08/19 with restoration of NSR.   He had TURP in 6/21.   Cardiac MRI in 6/21 showed EF 35%, LGE pattern consistent with lateral infarction.   He saw Dr. Curt Bears and atrial fibrillation ablation was recommended. S/P A fib ablation 02/2020. Zio patch was placed to assess PVC burden. Had 16% PVC burden.   Today he returns for HF follow up.Overall feeling fine. Not walking around much. Mild SOB with exertion. Denies PND/Orthopnea. Appetite ok. No fever or chills. Weight at home 185-188  pounds. NO bleeding issues. Taking all medications.  Labs (3/21): K 4.7, creatinine 1.6, TSH elevated, free T3/free T4 normal, LFTs normal.  Labs (5/21): LFTs normal, digoxin 0.8, TSH mildly elevated, free T3 and free T4 normal.  Labs (6/21): K 4.9 => 5.3, creatinine 1.77 => 1.65 Labs (02/01/20): K 5.1 Creatinine 1.5   ECG - NSR 60 with occasional PVC  PMH: 1. Atrial fibrillation: Paroxysmal.   - DCCV to NSR 3/21.  2. BPH with urinary retention - TURP 6/21 3. Type 2 diabetes 4. HTN 5. Nephrolithiasis 6. CKD stage 3 7. Chronic systolic CHF: Nonischemic  cardiomyopathy.  - Echo (2/21): EF < 20%, with severely decreased RV systolic function. - LHC (2/21): No significant CAD - Cardiac MRI (6/21): EF 35%, LGE pattern consistent with lateral infarction.    Current Outpatient Medications  Medication Sig Dispense Refill  . amiodarone (PACERONE) 200 MG tablet Take 200 mg by mouth at bedtime.    [name] apixaban (ELIQUIS) 5 MG TABS tablet Take 1 tablet (5 mg total)  by mouth 2 (two) times daily. 180 tablet 3  . atorvastatin (LIPITOR) 10 MG tablet Take 10 mg by mouth at bedtime.     . carvedilol (COREG) 6.25 MG tablet Take 1 tablet (6.25 mg total) by mouth 2 (two) times daily with a meal. 180 tablet 3  . digoxin (LANOXIN) 0.125 MG tablet Take 0.5 tablets (0.0625 mg total) by mouth daily. 45 tablet 3  . empagliflozin (JARDIANCE) 25 MG TABS tablet Take 25 mg by mouth daily.    . Multiple Vitamin (MULTIVITAMIN WITH MINERALS) TABS tablet Take 1 tablet by mouth daily.    . sacubitril-valsartan (ENTRESTO) 49-51 MG Take 0.5 tablets by mouth 2 (two) times daily.    [name] spironolactone (ALDACTONE) 25 MG tablet Take 0.5 tablets (12.5 mg total) by mouth daily. 15 tablet 5  . traZODone (DESYREL) 50 MG tablet Take 50 mg by mouth at bedtime.     No current facility-administered medications for this encounter.    No Known Allergies    Social History   Socioeconomic History  . Marital status: Divorced    Spouse name: Not on file  . Number of children: Not on file  . Years of education: Not on file  . Highest education level: Not on file  Occupational History  . Not on file  Tobacco Use  . Smoking status: Light Tobacco Smoker    Types: Cigars  . Smokeless tobacco: Current User    Types: Chew  . Tobacco comment: rarely chews tobacco cigar  Vaping Use  . Vaping Use: Never used  Substance and Sexual Activity  . Alcohol use: Yes    Alcohol/week: 1.0 standard drink    Types: 1 Cans of beer per week  . Drug use: No  . Sexual activity: Not Currently  Other Topics Concern  . Not on file  Social History Narrative  . Not on file   Social Determinants of Health   Financial Resource Strain:   . Difficulty of Paying Living Expenses: Not on file  Food Insecurity:   . Worried About Charity fundraiser in the Last Year: Not on file  . Ran Out of Food in the Last Year: Not on file  Transportation Needs:   . Lack of Transportation (Medical): Not on file  . Lack  of Transportation (Non-Medical): Not on file  Physical Activity:   . Days of Exercise per Week: Not on file  . Minutes of Exercise per Session: Not on file  Stress:   . Feeling of Stress : Not on file  Social Connections:   . Frequency of Communication with Friends and Family: Not on file  . Frequency of Social Gatherings with Friends and Family: Not on file  . Attends Religious Services: Not on file  . Active Member of Clubs or Organizations: Not on file  . Attends Archivist Meetings: Not on file  . Marital Status: Not on file  Intimate Partner Violence:   . Fear of Current or Ex-Partner: Not on file  . Emotionally Abused: Not on file  . Physically  Abused: Not on file  . Sexually Abused: Not on file      Family History  Problem Relation Age of Onset  . Diabetes Mother   . Hypertension Mother   . Heart disease Mother   . Diabetes Father   . Hypertension Father   . Heart disease Father   . Stroke Father   . Colon cancer Neg Hx     Vitals:   04/09/20 0933  BP: 138/70  Pulse: 64  SpO2: 96%  Weight: 86.8 kg (191 lb 6.4 oz)   Wt Readings from Last 3 Encounters:  04/09/20 86.8 kg (191 lb 6.4 oz)  03/06/20 84.6 kg (186 lb 6.4 oz)  02/07/20 83.9 kg (185 lb)     PHYSICAL EXAM: General:  Well appearing. No resp difficulty HEENT: normal Neck: supple. no JVD. Carotids 2+ bilat; no bruits. No lymphadenopathy or thryomegaly appreciated. Cor: PMI nondisplaced. Regular rate & rhythm. No rubs, gallops or murmurs. Lungs: clear Abdomen: soft, nontender, nondistended. No hepatosplenomegaly. No bruits or masses. Good bowel sounds. Extremities: no cyanosis, clubbing, rash, edema Neuro: alert & orientedx3, cranial nerves grossly intact. moves all 4 extremities w/o difficulty. Affect pleasant  EKG: NSR 60 bpm occasional PVC  ASSESSMENT & PLAN: 1. Chronic systolic CHF: Echo 7/67 with EF <20%, severely decreased RV function. Nonischemic cardiomyopathy,  ?tachycardia-mediated due to atrial fibrillation of uncertain duration. No prior echo or history of CHF.He required milrinone during 2/21 admission but was titrated off.  Coronary angiography in 2/21 with no significant coronary disease. Cardiac MRI in 6/21 with EF up to 35%, LGE pattern consistent with lateral MI (surprising given no significant CAD on cath).  -NYHA III. Volume status stable.  - Continue Coreg 6.25 mg bid. No increase with heart rate 60.  - Continue torsemide 20 mg daily.   - Continue digoxin 0.0625 daily, check level today.  - Continue spironolactone 25 mg daily.  - Continue Entresto 24-26 mg twice a day. No increase with recent K 5.1.  - Continuet dapagliflozin 10 mg daily with CKD and CHF - Check BMET today.  2. Atrial fibrillation: Paroxysmal.  Admitted with afib/RVR of uncertain duration 3/41. Unsure if atrial fibrillation/RVR was cause of CMP (tachy-mediated) or an effect of the cardiomyopathy.  TEE on 2/22 showed LA appendage thrombus. He was anticoagulated, then underwent DC-CV on 09/08/19 with conversion to NSR. - S/P 02/07/20  A fib ablation. In NSR today.  - Continue amiodarone, check LFTs/TSH today.  He will need regular eye exam while on amiodarone.   - Continue apixaban  -  He has follow up with Dr Curt Bears next month and will see if he can come off amio at that time.  3. CKD stage 3:  - Check BMET today.  4. BPH/urinary obstruction: Has had TURP.  5. PVCs: Zio patch with 01/2020 16% PVCs. On carvedilol and amio.   Follow up with Dr Aundra Dubin in 2-3 months.      Darrick Grinder, NP 04/09/20

## 2020-04-09 NOTE — Patient Instructions (Signed)
Labs done today, we will call you for abnormal results  Your physician recommends that you schedule a follow-up appointment in: 2-3 months  If you have any questions or concerns before your next appointment please send [place] a message through [place] or call our office at [phone].    TO LEAVE A MESSAGE FOR THE NURSE SELECT OPTION 2, PLEASE LEAVE A MESSAGE INCLUDING: . YOUR NAME . DATE OF BIRTH . CALL BACK NUMBER . REASON FOR CALL**this is important as we prioritize the call backs  [place] AS LONG AS YOU CALL BEFORE 4:00 PM  At the [facility], you and your health needs are our priority. As part of our continuing mission to provide you with exceptional heart care, we have created designated Provider Care Teams. These Care Teams include your primary Cardiologist (physician) and Advanced Practice Providers (APPs- Physician Assistants and Nurse Practitioners) who all work together to provide you with the care you need, when you need it.   You may see any of the following providers on your designated Care Team at your next follow up: [name] Dr Glori Bickers . Dr Loralie Champagne . Darrick Grinder, NP . Lyda Jester, PA . Audry Riles, PharmD   Please be sure to bring in all your medications bottles to every appointment.

## 2020-04-17 DIAGNOSIS — N183 Chronic kidney disease, stage 3 unspecified: Secondary | ICD-10-CM | POA: Diagnosis not present

## 2020-04-17 DIAGNOSIS — Z79899 Other long term (current) drug therapy: Secondary | ICD-10-CM | POA: Diagnosis not present

## 2020-04-17 DIAGNOSIS — I5022 Chronic systolic (congestive) heart failure: Secondary | ICD-10-CM | POA: Diagnosis not present

## 2020-04-17 DIAGNOSIS — E1129 Type 2 diabetes mellitus with other diabetic kidney complication: Secondary | ICD-10-CM | POA: Diagnosis not present

## 2020-04-24 DIAGNOSIS — I5022 Chronic systolic (congestive) heart failure: Secondary | ICD-10-CM | POA: Diagnosis not present

## 2020-04-24 DIAGNOSIS — I480 Paroxysmal atrial fibrillation: Secondary | ICD-10-CM | POA: Diagnosis not present

## 2020-04-24 DIAGNOSIS — E1122 Type 2 diabetes mellitus with diabetic chronic kidney disease: Secondary | ICD-10-CM | POA: Diagnosis not present

## 2020-04-24 DIAGNOSIS — N1832 Chronic kidney disease, stage 3b: Secondary | ICD-10-CM | POA: Diagnosis not present

## 2020-04-24 DIAGNOSIS — R7309 Other abnormal glucose: Secondary | ICD-10-CM | POA: Diagnosis not present

## 2020-05-06 ENCOUNTER — Encounter (HOSPITAL_COMMUNITY): Payer: Medicare Other

## 2020-05-13 ENCOUNTER — Ambulatory Visit: Payer: Medicare Other | Admitting: Cardiology

## 2020-05-13 ENCOUNTER — Encounter: Payer: Self-pay | Admitting: Cardiology

## 2020-05-13 ENCOUNTER — Other Ambulatory Visit: Payer: Self-pay

## 2020-05-13 VITALS — BP 138/74 | HR 68 | Ht 69.0 in | Wt 193.0 lb

## 2020-05-13 DIAGNOSIS — I4819 Other persistent atrial fibrillation: Secondary | ICD-10-CM | POA: Diagnosis not present

## 2020-05-13 NOTE — Patient Instructions (Signed)
Medication Instructions:  Your physician has recommended you make the following change in your medication:  1. STOP Amiodarone  *If you need a refill on your cardiac medications before your next appointment, please call your pharmacy*   Lab Work: None ordered   Testing/Procedures: None ordered   Follow-Up: At [facility], you and your health needs are our priority.  As part of our continuing mission to provide you with exceptional heart care, we have created designated Provider Care Teams.  These Care Teams include your primary Cardiologist (physician) and Advanced Practice Providers (APPs -  Physician Assistants and Nurse Practitioners) who all work together to provide you with the care you need, when you need it.  We recommend signing up for the patient portal called "MyChart".  Sign up information is provided on this After Visit Summary.  MyChart is used to connect with patients for Virtual Visits (Telemedicine).  Patients are able to view lab/test results, encounter notes, upcoming appointments, etc.  Non-urgent messages can be sent to your provider as well.   To learn more about what you can do with MyChart, go to [email or web address].    Your next appointment:   3 month(s)  The format for your next appointment:   In Person  Provider:   Will Camnitz, MD    Thank you for choosing [facility]!!   Dewey Viens, RN [phone]   

## 2020-05-13 NOTE — Progress Notes (Signed)
Electrophysiology Office Note   Date:  05/13/2020   ID:  Chris Guerrero, DOB 02-02-1941, MRN [id number]  PCP:  [name], MD  Cardiologist:  Aundra Dubin Primary Electrophysiologist:  Natacha Jepsen Meredith Leeds, MD    Chief Complaint: AF   History of Present Illness: Chris Guerrero is a 79 y.o. male who is being seen today for the evaluation of AF at the request of [name], MD. Presenting today for electrophysiology evaluation.  He has a history of hypertension and hyperlipidemia.  He reported to [facility] with complaints of cough and dyspnea on exertion as well as progressive edema.  He was noted to be in atrial fibrillation.  He was started on amiodarone.  An echo showed ejection fraction of less than 20% with diffuse hypokinesis.  He did require milrinone due to low output heart failure.  Right and left heart catheterizations were done which showed elevated filling pressures.  He had a TEE and cardioversion 08/07/2019 but was found to have a left atrial appendage thrombus.  He had a repeat cardioversion 09/08/2019 with restoration of sinus rhythm.  He has now status post AF ablation 02/07/2020.   Today, denies symptoms of palpitations, chest pain, shortness of breath, orthopnea, PND, lower extremity edema, claudication, dizziness, presyncope, syncope, bleeding, or neurologic sequela. The patient is tolerating medications without difficulties.  Since last being seen he has done well.  He has had no further episodes of atrial fibrillation since his ablation.  He is able to do all of his daily activities and is unaware of palpitations.   Past Medical History:  Diagnosis Date  . Atrial fibrillation ([place])   . BPH with obstruction/lower urinary tract symptoms   . CHF (congestive heart failure) ([place])   . CKD (chronic kidney disease), stage III ([place])   . Diabetes mellitus without complication ([place])   . History of kidney stones   . Hyperlipemia   . Hypertension   . Nonischemic  cardiomyopathy [facility])    Past Surgical History:  Procedure Laterality Date  . ATRIAL FIBRILLATION ABLATION N/A 02/07/2020   Procedure: ATRIAL FIBRILLATION ABLATION;  Surgeon: Constance Haw, MD;  Location: [place] CV LAB;  Service: Cardiovascular;  Laterality: N/A;  . CARDIOVERSION N/A 09/08/2019   Procedure: CARDIOVERSION;  Surgeon: Larey Dresser, MD;  Location: [facility] ENDOSCOPY;  Service: Cardiovascular;  Laterality: N/A;  . CATARACT EXTRACTION W/ INTRAOCULAR LENS IMPLANT    . CHOLECYSTECTOMY    . COLONOSCOPY  09/04/2010   RMR: diverticula, cecal polyp = tubular adenoma; Repeat in 5 years  . COLONOSCOPY N/A 10/10/2015   Procedure: COLONOSCOPY;  Surgeon: Daneil Dolin, MD;  Location[facility] ENDO [facility];  Service: Endoscopy;  Laterality: N/A;  1300  . HERNIA REPAIR    . RIGHT/LEFT HEART CATH AND CORONARY ANGIOGRAPHY N/A 08/04/2019   Procedure: RIGHT/LEFT HEART CATH AND CORONARY ANGIOGRAPHY;  Surgeon: Larey Dresser, MD;  Location: [place] CV LAB;  Service: Cardiovascular;  Laterality: N/A;  . TEE WITHOUT CARDIOVERSION N/A 08/07/2019   Procedure: TRANSESOPHAGEAL ECHOCARDIOGRAM (TEE);  Surgeon: Larey Dresser, MD;  Location: [facility] ENDOSCOPY;  Service: Cardiovascular;  Laterality: N/A;  . TEE WITHOUT CARDIOVERSION N/A 09/08/2019   Procedure: TRANSESOPHAGEAL ECHOCARDIOGRAM (TEE);  Surgeon: Larey Dresser, MD;  Location: [facility] ENDOSCOPY;  Service: Cardiovascular;  Laterality: N/A;  . TRANSURETHRAL RESECTION OF PROSTATE N/A 11/27/2019   Procedure: TRANSURETHRAL RESECTION OF THE PROSTATE (TURP);  Surgeon: Kathie Rhodes, MD;  Location: [facility] ORS;  Service: Urology;  Laterality: N/A;  Current Outpatient Medications  Medication Sig Dispense Refill  . apixaban (ELIQUIS) 5 MG TABS tablet Take 1 tablet (5 mg total) by mouth 2 (two) times daily. 180 tablet 3  . atorvastatin (LIPITOR) 10 MG tablet Take 10 mg by mouth at bedtime.     . carvedilol (COREG) 6.25 MG tablet Take 1 tablet (6.25 mg total) by  mouth 2 (two) times daily with a meal. 180 tablet 3  . digoxin (LANOXIN) 0.125 MG tablet Take 0.5 tablets (0.0625 mg total) by mouth daily. 45 tablet 3  . empagliflozin (JARDIANCE) 25 MG TABS tablet Take 25 mg by mouth daily.    . metFORMIN (GLUCOPHAGE) 500 MG tablet Take 1 tablet by mouth in the morning and at bedtime.    . Multiple Vitamin (MULTIVITAMIN WITH MINERALS) TABS tablet Take 1 tablet by mouth daily.    . sacubitril-valsartan (ENTRESTO) 49-51 MG Take 0.5 tablets by mouth 2 (two) times daily.    [name] spironolactone (ALDACTONE) 25 MG tablet Take 12.5 mg by mouth daily.     . traZODone (DESYREL) 50 MG tablet Take 50 mg by mouth at bedtime.     No current facility-administered medications for this visit.    Allergies:   Patient has no known allergies.   Social History:  The patient  reports that he has been smoking cigars. His smokeless tobacco use includes chew. He reports current alcohol use of about 1.0 standard drink of alcohol per week. He reports that he does not use drugs.   Family History:  The patient's family history includes Diabetes in his father and mother; Heart disease in his father and mother; Hypertension in his father and mother; Stroke in his father.   ROS:  Please see the history of present illness.   Otherwise, review of systems is positive for none.   All other systems are reviewed and negative.   PHYSICAL EXAM: VS:  BP 138/74   Pulse 68   Ht 5\' 9"  (1.753 m)   Wt 193 lb (87.5 kg)   SpO2 96%   BMI 28.50 kg/m  , BMI Body mass index is 28.5 kg/m. GEN: Well nourished, well developed, in no acute distress  HEENT: normal  Neck: no JVD, carotid bruits, or masses Cardiac: RRR; no murmurs, rubs, or gallops,no edema  Respiratory:  clear to auscultation bilaterally, normal work of breathing GI: soft, nontender, nondistended, + BS MS: no deformity or atrophy  Skin: warm and dry Neuro:  Strength and sensation are intact Psych: euthymic mood, full affect  EKG:  EKG  is ordered today. Personal review of the ekg ordered shows sinus rhythm, rate 67  Recent Labs: 08/07/2019: Magnesium 2.0 08/28/2019: B Natriuretic Peptide 362.0 01/08/2020: ALT 35; TSH 5.040 01/15/2020: Hemoglobin 13.2; Platelets 278 04/09/2020: BUN 31; Creatinine, Ser 1.34; Potassium 4.6; Sodium 137    Lipid Panel  No results found for: CHOL, TRIG, HDL, CHOLHDL, VLDL, LDLCALC, LDLDIRECT   Wt Readings from Last 3 Encounters:  05/13/20 193 lb (87.5 kg)  04/09/20 191 lb 6.4 oz (86.8 kg)  03/06/20 186 lb 6.4 oz (84.6 kg)      Other studies Reviewed: Additional studies/ records that were reviewed today include: RHC/LHC 08/04/19  Review of the above records today demonstrates:  1. Mildly elevated filling pressures.   2. Excellent cardiac output on milrinone.  3. No significant coronary disease.    TEE 09/20/19 1. Left ventricular ejection fraction, by estimation, is 25 to 30%. The  left ventricle has severely decreased function. The  left ventricle  demonstrates global hypokinesis. The left ventricular internal cavity size  was mildly dilated.  2. Right ventricular systolic function is normal. The right ventricular  size is mildly enlarged. There is normal pulmonary artery systolic  pressure.  3. There was a small PFO by color doppler. Left atrial size was  moderately dilated. No left atrial/left atrial appendage thrombus was  detected.  4. Right atrial size was moderately dilated.  5. The mitral valve is normal in structure. Trivial mitral valve  regurgitation. No evidence of mitral stenosis.  6. The aortic valve is tricuspid. Aortic valve regurgitation is not  visualized. No aortic stenosis is present.  7. Normal caliber thoracic aorta with grade 3 plaque in the descending  thoracic aorta.   Cardiac monitor 01/21/2020 personally reviewed 1. Predominantly NSR.  2. 1 short NSVT run. 3. 16% PVCs.   ASSESSMENT AND PLAN:  1.  Persistent atrial fibrillation: Currently on  amiodarone, high risk medication monitoring, and Eliquis.  CHA2DS2-VASc of at least 2.  Status post AF ablation 02/07/2020.  He is remained in sinus rhythm.  We Lavaughn Haberle plan to stop amiodarone.  2.  Chronic systolic heart failure: Likely due to a tachycardia mediated cardiomyopathy.  Currently on carvedilol, torsemide, Entresto, Aldactone.  Ejection fraction on cardiac MRI approximately 35%.    3.  Stage III CKD: Plan per primary cardiology.  4.  PVCs: No PVCs on his ECG today.  He did wear a cardiac monitor with a 16% burden.  And plan to stop his amiodarone today.  We Kalika Smay reassess once his amiodarone is washed out as far as a PVC burden.  He would like to hold off on further therapy at this time.  Current medicines are reviewed at length with the patient today.   The patient does not have concerns regarding his medicines.  The following changes were made today: Stop amiodarone  Labs/ tests ordered today include:  Orders Placed This Encounter  Procedures  . EKG 12-Lead     Disposition:   FU with Marvelle Caudill 3 months  Signed, Steffanie Mingle Meredith Leeds, MD  05/13/2020 2:07 PM     [address] [address] [phone] (office) [phone] (fax)

## 2020-06-20 ENCOUNTER — Other Ambulatory Visit (HOSPITAL_COMMUNITY): Payer: Self-pay | Admitting: Cardiology

## 2020-06-20 ENCOUNTER — Other Ambulatory Visit: Payer: Self-pay

## 2020-06-20 ENCOUNTER — Ambulatory Visit (HOSPITAL_COMMUNITY)
Admission: RE | Admit: 2020-06-20 | Discharge: 2020-06-20 | Disposition: A | Discharge status: Home / Self Care | Payer: Medicare Other | Source: Ambulatory Visit | Attending: Cardiology | Admitting: Cardiology

## 2020-06-20 ENCOUNTER — Encounter (HOSPITAL_COMMUNITY): Payer: Self-pay | Admitting: Cardiology

## 2020-06-20 VITALS — BP 124/70 | HR 72 | Wt 197.4 lb

## 2020-06-20 DIAGNOSIS — Z79899 Other long term (current) drug therapy: Secondary | ICD-10-CM | POA: Diagnosis not present

## 2020-06-20 DIAGNOSIS — N401 Enlarged prostate with lower urinary tract symptoms: Secondary | ICD-10-CM | POA: Insufficient documentation

## 2020-06-20 DIAGNOSIS — I4819 Other persistent atrial fibrillation: Secondary | ICD-10-CM | POA: Diagnosis not present

## 2020-06-20 DIAGNOSIS — Z833 Family history of diabetes mellitus: Secondary | ICD-10-CM | POA: Diagnosis not present

## 2020-06-20 DIAGNOSIS — Z7901 Long term (current) use of anticoagulants: Secondary | ICD-10-CM | POA: Diagnosis not present

## 2020-06-20 DIAGNOSIS — E1122 Type 2 diabetes mellitus with diabetic chronic kidney disease: Secondary | ICD-10-CM | POA: Insufficient documentation

## 2020-06-20 DIAGNOSIS — Z823 Family history of stroke: Secondary | ICD-10-CM | POA: Diagnosis not present

## 2020-06-20 DIAGNOSIS — Z7984 Long term (current) use of oral hypoglycemic drugs: Secondary | ICD-10-CM | POA: Insufficient documentation

## 2020-06-20 DIAGNOSIS — Z8249 Family history of ischemic heart disease and other diseases of the circulatory system: Secondary | ICD-10-CM | POA: Diagnosis not present

## 2020-06-20 DIAGNOSIS — I5022 Chronic systolic (congestive) heart failure: Secondary | ICD-10-CM | POA: Insufficient documentation

## 2020-06-20 DIAGNOSIS — I5042 Chronic combined systolic (congestive) and diastolic (congestive) heart failure: Secondary | ICD-10-CM | POA: Diagnosis not present

## 2020-06-20 DIAGNOSIS — N183 Chronic kidney disease, stage 3 unspecified: Secondary | ICD-10-CM | POA: Insufficient documentation

## 2020-06-20 DIAGNOSIS — I130 Hypertensive heart and chronic kidney disease with heart failure and stage 1 through stage 4 chronic kidney disease, or unspecified chronic kidney disease: Secondary | ICD-10-CM | POA: Insufficient documentation

## 2020-06-20 DIAGNOSIS — I480 Paroxysmal atrial fibrillation: Secondary | ICD-10-CM | POA: Insufficient documentation

## 2020-06-20 DIAGNOSIS — I428 Other cardiomyopathies: Secondary | ICD-10-CM | POA: Diagnosis not present

## 2020-06-20 DIAGNOSIS — I493 Ventricular premature depolarization: Secondary | ICD-10-CM | POA: Diagnosis not present

## 2020-06-20 LAB — CBC
HCT: 44.6 % (ref 39.0–52.0)
Hemoglobin: 13.9 g/dL (ref 13.0–17.0)
MCH: 27.2 pg (ref 26.0–34.0)
MCHC: 31.2 g/dL (ref 30.0–36.0)
MCV: 87.3 fL (ref 80.0–100.0)
Platelets: 238 10*3/uL (ref 150–400)
RBC: 5.11 MIL/uL (ref 4.22–5.81)
RDW: 16.3 % — ABNORMAL HIGH (ref 11.5–15.5)
WBC: 6.5 10*3/uL (ref 4.0–10.5)
nRBC: 0 % (ref 0.0–0.2)

## 2020-06-20 LAB — DIGOXIN LEVEL: Digoxin Level: 0.6 ng/mL — ABNORMAL LOW (ref 0.8–2.0)

## 2020-06-20 LAB — BASIC METABOLIC PANEL
Anion gap: 12 (ref 5–15)
BUN: 21 mg/dL (ref 8–23)
CO2: 23 mmol/L (ref 22–32)
Calcium: 10.5 mg/dL — ABNORMAL HIGH (ref 8.9–10.3)
Chloride: 102 mmol/L (ref 98–111)
Creatinine, Ser: 1.37 mg/dL — ABNORMAL HIGH (ref 0.61–1.24)
GFR, Estimated: 52 mL/min — ABNORMAL LOW (ref >60)
Glucose, Bld: 204 mg/dL — ABNORMAL HIGH (ref 70–99)
Potassium: 4.7 mmol/L (ref 3.5–5.1)
Sodium: 137 mmol/L (ref 135–145)

## 2020-06-20 MED ORDER — ENTRESTO 97-103 MG PO TABS: 1.0000 | ORAL_TABLET | Freq: Two times a day (BID) | ORAL | 3 refills | Status: DC

## 2020-06-20 NOTE — Progress Notes (Signed)
t  Advanced Heart Failure Clinic Note    PCP: [name], MD PCP-Cardiologist: Kate Sable, MD (Inactive)  AHF: Dr. Aundra Dubin  Urology: Dr Karsten Ro   HPI: 80 y.o. male w/prior history of HTN and hyperlipidemia, who initially reported to [facility] w/ complaints of about 1 month of cough and dyspnea with exertion as well as progressive edema. No chest pain. He saw his PCPprior to coming to the [facility] 08/01/19 and was noted to be in atrial fibrillation with RVR and he was then sent to [facility] for admission. Hs-TnI was flat at 25 =>27. BNP elevated, 1,068.COVID negative.He was initially tried on diltiazem gtt but became hypotensiveand changed toamiodarone gtt. Echo was done, showing EF <20% with diffuse hypokinesis, severely decreased RV function.He was transferred to [facility] on 2/18for HF work up and cath.   On arrival to [facility], he was found to be volume overloaded w/ soft SBPs and rising SCr up to 1.9 with attempts at diuresis, concerning for low output. PICC line was placed to measure co-ox and CVP. Initial co-ox low at 46%. He was started on milrinone and IV Lasix. IV amiodarone continued for afib + IV heparin.RHC/LHC 08/04/19 with no coronary disease, mildly elevated filling pressures.Co-ox improved w/ milrinone. He was ultimately weaned off milrinone and co-ox remained stable. He diuresed well w/ IV Lasix and later changed to torsemide. SCr improved w/ diuresis, down to 1.32.   He was set up for TEE/DCCV on 08/07/19 and found to have LAA thrombus on TEE. DCCV canceled. IV amiodarone was discontinued and he was changed to PO, 200 mg bid. Placed on Eliquis 5 bid.ASA discontinued.Plan to repeat TEE/DCCV after 1 month of chronic anticoagulation. He was advised to continue amiodarone 200 mg bid x 7 days, followed by 200 mg daily.   Also of note, he develop urinary retention due to BPH. Had over 500cc on bladder scan.Multiple attempts were made by the nursing staff to place a foley which were  unsuccessful. Urology was consulted and placed16 french council foley catheter. Subsequently, he had mild hematuria which ultimately cleared. Urology recommended foley to continue on discharge with plans to be followed closely in outpatient clinic. He was discharged from the hospital 08/09/19.   He had f/u w/ urology 08/15/19 and foley removed. After returning home, he redeveloped urinary retention and went to ED next day. Foley cath reinserted.   S/P DC-CV 09/08/19 with restoration of NSR.   He had TURP in 6/21.   Cardiac MRI in 6/21 showed EF 35%, LGE pattern consistent with lateral infarction.   He saw Dr. Curt Bears and atrial fibrillation ablation was recommended. S/P A fib ablation 02/2020. Zio patch was placed to assess PVC burden. Had 16% PVC burden.   Patient returns for followup of CHF.  He is in NSR today.  No lightheadedness, no palpitations.  No chest pain.  No exertional dyspnea though not very active.  He has a treadmill at home but does not use it.  No BRBPR/melena.  He is no longer taking amiodarone.   Labs (3/21): K 4.7, creatinine 1.6, TSH elevated, free T3/free T4 normal, LFTs normal.  Labs (5/21): LFTs normal, digoxin 0.8, TSH mildly elevated, free T3 and free T4 normal.  Labs (6/21): K 4.9 => 5.3, creatinine 1.77 => 1.65 Labs (02/01/20): K 5.1 Creatinine 1.5  Labs (10/21): K 4.6, creatinine 1.34  ECG: NSR, normal (personally reviewed)  PMH: 1. Atrial fibrillation: Paroxysmal.   - DCCV to NSR 3/21.  - Atrial fibrillation ablation 9/21 2.  BPH with urinary retention - TURP 6/21 3. Type 2 diabetes 4. HTN 5. Nephrolithiasis 6. CKD stage 3 7. Chronic systolic CHF: Nonischemic cardiomyopathy.  - Echo (2/21): EF < 20%, with severely decreased RV systolic function. - LHC (2/21): No significant CAD - Cardiac MRI (6/21): EF 35%, LGE pattern consistent with lateral infarction.  8. PVCs: 8/21 Zio patch with 16% PVCs.    Current Outpatient Medications  Medication Sig Dispense  Refill  . apixaban (ELIQUIS) 5 MG TABS tablet Take 1 tablet (5 mg total) by mouth 2 (two) times daily. 180 tablet 3  . atorvastatin (LIPITOR) 10 MG tablet Take 10 mg by mouth at bedtime.     . carvedilol (COREG) 6.25 MG tablet Take 1 tablet (6.25 mg total) by mouth 2 (two) times daily with a meal. 180 tablet 3  . digoxin (LANOXIN) 0.125 MG tablet Take 0.5 tablets (0.0625 mg total) by mouth daily. 45 tablet 3  . empagliflozin (JARDIANCE) 25 MG TABS tablet Take 25 mg by mouth daily.    . metFORMIN (GLUCOPHAGE) 500 MG tablet Take 1 tablet by mouth in the morning and at bedtime.    . Multiple Vitamin (MULTIVITAMIN WITH MINERALS) TABS tablet Take 1 tablet by mouth daily.    . sacubitril-valsartan (ENTRESTO) 97-103 MG Take 1 tablet by mouth 2 (two) times daily. 60 tablet 3  . spironolactone (ALDACTONE) 25 MG tablet Take 12.5 mg by mouth daily.     . traZODone (DESYREL) 50 MG tablet Take 50 mg by mouth at bedtime.     No current facility-administered medications for this encounter.    No Known Allergies    Social History   Socioeconomic History  . Marital status: Divorced    Spouse name: Not on file  . Number of children: Not on file  . Years of education: Not on file  . Highest education level: Not on file  Occupational History  . Not on file  Tobacco Use  . Smoking status: Light Tobacco Smoker    Types: Cigars  . Smokeless tobacco: Current User    Types: Chew  . Tobacco comment: rarely chews tobacco cigar  Vaping Use  . Vaping Use: Never used  Substance and Sexual Activity  . Alcohol use: Yes    Alcohol/week: 1.0 standard drink    Types: 1 Cans of beer per week  . Drug use: No  . Sexual activity: Not Currently  Other Topics Concern  . Not on file  Social History Narrative  . Not on file   Social Determinants of Health   Financial Resource Strain: Not on file  Food Insecurity: Not on file  Transportation Needs: Not on file  Physical Activity: Not on file  Stress: Not on  file  Social Connections: Not on file  Intimate Partner Violence: Not on file      Family History  Problem Relation Age of Onset  . Diabetes Mother   . Hypertension Mother   . Heart disease Mother   . Diabetes Father   . Hypertension Father   . Heart disease Father   . Stroke Father   . Colon cancer Neg Hx     Vitals:   06/20/20 0859  BP: 124/70  Pulse: 72  SpO2: 97%  Weight: 89.5 kg (197 lb 6.4 oz)   Wt Readings from Last 3 Encounters:  06/20/20 89.5 kg (197 lb 6.4 oz)  05/13/20 87.5 kg (193 lb)  04/09/20 86.8 kg (191 lb 6.4 oz)   PHYSICAL EXAM: General:  NAD Neck: No JVD, no thyromegaly or thyroid nodule.  Lungs: Clear to auscultation bilaterally with normal respiratory effort. CV: Nondisplaced PMI.  Heart regular S1/S2, no S3/S4, no murmur.  No peripheral edema.  No carotid bruit.  Normal pedal pulses.  Abdomen: Soft, nontender, no hepatosplenomegaly, no distention.  Skin: Intact without lesions or rashes.  Neurologic: Alert and oriented x 3.  Psych: Normal affect. Extremities: No clubbing or cyanosis.  HEENT: Normal.   ASSESSMENT & PLAN: 1. Chronic systolic CHF: Echo 8/25 with EF <20%, severely decreased RV function. Nonischemic cardiomyopathy, ?tachycardia-mediated due to atrial fibrillation of uncertain duration. He also has history of frequent PVCs (16% on 8/21 Zio patch). He required milrinone during 2/21 admission but was titrated off.  Coronary angiography in 2/21 with no significant coronary disease. Cardiac MRI in 6/21 with EF up to 35%, LGE pattern consistent with lateral MI (surprising given no significant CAD on cath).  NYHA class II symptoms, he is not volume overloaded on exam.  - Continue Coreg 6.25 mg bid.  - He is no longer taking torsemide.  - Continue digoxin 0.0625 daily, check level today.  - Continue spironolactone 25 mg daily.  - Increase Entresto to 97/103 bid.  BMET today and in 10 days.   - Continue empagliflozin with CKD and CHF - I  will arrange for echocardiogram to reassess EF.  Given age, would probably avoid ICD.  2. Atrial fibrillation: Paroxysmal.  Admitted with afib/RVR of uncertain duration 0/53. Unsure if atrial fibrillation/RVR was cause of CMP (tachy-mediated) or an effect of the cardiomyopathy.  TEE on 2/22 showed LA appendage thrombus. He was anticoagulated, then underwent DC-CV on 09/08/19 with conversion to NSR. Atrial fibrillation ablation in 9/21.  He is in NSR today.  - He is now off amiodarone.   - Continue apixaban, CBC today.  3. CKD stage 3: BMET today. 4. BPH/urinary obstruction: Has had TURP.  5. PVCs: Zio patch 01/2020 with 16% PVCs.  He is now off amiodarone.  - I will arrange for 7 day Zio patch to reassess PVC burden.   Follow up 3 months.    Loralie Champagne, MD 06/20/20

## 2020-06-20 NOTE — Patient Instructions (Addendum)
INCREASE Entresto 97/103mg  (1 tablet) Twice daily  Labs done today, your results will be available in MyChart, we will contact you for abnormal readings.  Your provider has recommended that  you wear a Zio Patch for 7 days.  This monitor will record your heart rhythm for our review.  IF you have any symptoms while wearing the monitor please press the button.  If you have any issues with the patch or you notice a red or orange light on it please call the company at [phone].  Once you remove the patch please mail it back to the company as soon as possible so we can get the results.  Your physician has requested that you have an echocardiogram. Echocardiography is a painless test that uses sound waves to create images of your heart. It provides your doctor with information about the size and shape of your heart and how well your heart's chambers and valves are working. This procedure takes approximately one hour. There are no restrictions for this procedure.  Your physician recommends that you schedule a repeat labs in 10 days  Your physician recommends that you schedule a follow-up appointment in: 3 months with Dr. Aundra Dubin  If you have any questions or concerns before your next appointment please send [place] a message through [place] or call our office at [phone].    TO LEAVE A MESSAGE FOR THE NURSE SELECT OPTION 2, PLEASE LEAVE A MESSAGE INCLUDING: . YOUR NAME . DATE OF BIRTH . CALL BACK NUMBER . REASON FOR CALL**this is important as we prioritize the call backs  YOU WILL RECEIVE A CALL BACK THE SAME DAY AS LONG AS YOU CALL BEFORE 4:00 PM

## 2020-06-21 ENCOUNTER — Telehealth (HOSPITAL_COMMUNITY): Payer: Self-pay | Admitting: Surgery

## 2020-06-21 NOTE — Telephone Encounter (Signed)
Ok to stop the day before and the day of if the dentist thinks necessary, but I would rather him continue it if just a simple extraction.

## 2020-06-21 NOTE — Telephone Encounter (Signed)
Chris Guerrero called concerning stopping his Eliquis prior to a tooth extraction to take place on Jan 11th, 2022.

## 2020-06-24 NOTE — Telephone Encounter (Signed)
Spoke with patient he is aware and verbalized understanding.

## 2020-07-01 ENCOUNTER — Ambulatory Visit (HOSPITAL_COMMUNITY): Payer: Medicare Other

## 2020-07-01 ENCOUNTER — Other Ambulatory Visit (HOSPITAL_COMMUNITY): Payer: Medicare Other

## 2020-07-02 ENCOUNTER — Telehealth (HOSPITAL_COMMUNITY): Payer: Self-pay | Admitting: Cardiology

## 2020-07-02 ENCOUNTER — Other Ambulatory Visit (HOSPITAL_COMMUNITY): Payer: Self-pay | Admitting: *Deleted

## 2020-07-02 MED ORDER — ENTRESTO 97-103 MG PO TABS: 1.0000 | ORAL_TABLET | Freq: Two times a day (BID) | ORAL | 3 refills | Status: DC

## 2020-07-02 MED ORDER — ENTRESTO 97-103 MG PO TABS: 1.0000 | ORAL_TABLET | Freq: Two times a day (BID) | ORAL | 11 refills | Status: DC

## 2020-07-02 NOTE — Telephone Encounter (Signed)
Pt will p/u Entresto RX to take to [facility]

## 2020-07-02 NOTE — Telephone Encounter (Signed)
Pt stated the medication Entresto need to be sent to [facility], instead of [facility], pt stated he pays $11 at [facility]. Please advise

## 2020-07-04 ENCOUNTER — Ambulatory Visit (HOSPITAL_COMMUNITY)
Admission: RE | Admit: 2020-07-04 | Discharge: 2020-07-04 | Disposition: A | Discharge status: Home / Self Care | Payer: Medicare Other | Source: Ambulatory Visit | Attending: Cardiology | Admitting: Cardiology

## 2020-07-04 ENCOUNTER — Other Ambulatory Visit: Payer: Self-pay

## 2020-07-04 ENCOUNTER — Other Ambulatory Visit (HOSPITAL_COMMUNITY): Payer: Self-pay | Admitting: Cardiology

## 2020-07-04 ENCOUNTER — Telehealth (HOSPITAL_COMMUNITY): Payer: Self-pay | Admitting: *Deleted

## 2020-07-04 DIAGNOSIS — E119 Type 2 diabetes mellitus without complications: Secondary | ICD-10-CM | POA: Insufficient documentation

## 2020-07-04 DIAGNOSIS — I5042 Chronic combined systolic (congestive) and diastolic (congestive) heart failure: Secondary | ICD-10-CM | POA: Insufficient documentation

## 2020-07-04 DIAGNOSIS — I4891 Unspecified atrial fibrillation: Secondary | ICD-10-CM | POA: Insufficient documentation

## 2020-07-04 DIAGNOSIS — E785 Hyperlipidemia, unspecified: Secondary | ICD-10-CM | POA: Diagnosis not present

## 2020-07-04 DIAGNOSIS — I493 Ventricular premature depolarization: Secondary | ICD-10-CM | POA: Diagnosis not present

## 2020-07-04 DIAGNOSIS — I110 Hypertensive heart disease with heart failure: Secondary | ICD-10-CM | POA: Insufficient documentation

## 2020-07-04 LAB — BASIC METABOLIC PANEL
Anion gap: 11 (ref 5–15)
BUN: 23 mg/dL (ref 8–23)
CO2: 23 mmol/L (ref 22–32)
Calcium: 9.6 mg/dL (ref 8.9–10.3)
Chloride: 101 mmol/L (ref 98–111)
Creatinine, Ser: 1.48 mg/dL — ABNORMAL HIGH (ref 0.61–1.24)
GFR, Estimated: 47 mL/min — ABNORMAL LOW (ref >60)
Glucose, Bld: 248 mg/dL — ABNORMAL HIGH (ref 70–99)
Potassium: 5.6 mmol/L — ABNORMAL HIGH (ref 3.5–5.1)
Sodium: 135 mmol/L (ref 135–145)

## 2020-07-04 LAB — ECHOCARDIOGRAM COMPLETE
Area-P 1/2: 1.79 cm2
S' Lateral: 3.4 cm

## 2020-07-04 MED ORDER — SPIRONOLACTONE 25 MG PO TABS
12.5000 mg | ORAL_TABLET | Freq: Every day | ORAL | 3 refills | Status: DC
Start: 2020-07-04 — End: 2021-03-10

## 2020-07-04 NOTE — Telephone Encounter (Signed)
-----   Message from Larey Dresser, MD sent at 07/04/2020  4:45 PM EST ----- With improved EF, he can stop digoxin.  With high K, hold spironolactone x 2 days then decrease to 12.5 mg daily with BMET 1 week. Make sure no K supplement.

## 2020-07-04 NOTE — Progress Notes (Signed)
  Echocardiogram 2D Echocardiogram has been performed.  Fidel Levy 07/04/2020, 8:57 AM

## 2020-07-04 NOTE — Telephone Encounter (Signed)
Chris Guerrero, [place]  07/04/2020 5:13 PM EST Back to Top     Pt aware and verbalized understanding.    Larey Dresser, MD  07/04/2020 4:45 PM EST      With improved EF, he can stop digoxin. With high K, hold spironolactone x 2 days then decrease to 12.5 mg daily with BMET 1 week. Make sure no K supplement.

## 2020-07-08 ENCOUNTER — Telehealth (HOSPITAL_COMMUNITY): Payer: Self-pay

## 2020-07-08 MED ORDER — ENTRESTO 97-103 MG PO TABS: 1.0000 | ORAL_TABLET | Freq: Two times a day (BID) | ORAL | 11 refills | Status: DC

## 2020-07-08 MED ORDER — CARVEDILOL 6.25 MG PO TABS
9.3750 mg | ORAL_TABLET | Freq: Two times a day (BID) | ORAL | 11 refills | Status: DC
Start: 2020-07-08 — End: 2020-09-17

## 2020-07-08 MED ORDER — APIXABAN 5 MG PO TABS: 5.0000 mg | ORAL_TABLET | Freq: Two times a day (BID) | ORAL | 11 refills | Status: AC

## 2020-07-08 NOTE — Telephone Encounter (Signed)
-----   Message from Larey Dresser, MD sent at 07/07/2020  9:17 PM EST ----- 8% PVCs.  Increase Coreg to 9.375 mg bid.

## 2020-07-08 NOTE — Telephone Encounter (Signed)
Pt aware of results and increase in coreg dose. Pt verbalized understanding.  Refills for eliquis and entresto faxed to [place] at [phone]

## 2020-07-12 ENCOUNTER — Other Ambulatory Visit: Payer: Self-pay

## 2020-07-12 ENCOUNTER — Telehealth (HOSPITAL_COMMUNITY): Payer: Self-pay | Admitting: *Deleted

## 2020-07-12 ENCOUNTER — Ambulatory Visit (HOSPITAL_COMMUNITY)
Admission: RE | Admit: 2020-07-12 | Discharge: 2020-07-12 | Disposition: A | Discharge status: Home / Self Care | Payer: Medicare Other | Source: Ambulatory Visit | Attending: Cardiology | Admitting: Cardiology

## 2020-07-12 DIAGNOSIS — I5042 Chronic combined systolic (congestive) and diastolic (congestive) heart failure: Secondary | ICD-10-CM | POA: Insufficient documentation

## 2020-07-12 LAB — BASIC METABOLIC PANEL
Anion gap: 11 (ref 5–15)
BUN: 28 mg/dL — ABNORMAL HIGH (ref 8–23)
CO2: 21 mmol/L — ABNORMAL LOW (ref 22–32)
Calcium: 9.8 mg/dL (ref 8.9–10.3)
Chloride: 103 mmol/L (ref 98–111)
Creatinine, Ser: 1.5 mg/dL — ABNORMAL HIGH (ref 0.61–1.24)
GFR, Estimated: 46 mL/min — ABNORMAL LOW (ref >60)
Glucose, Bld: 198 mg/dL — ABNORMAL HIGH (ref 70–99)
Potassium: 4.9 mmol/L (ref 3.5–5.1)
Sodium: 135 mmol/L (ref 135–145)

## 2020-07-12 MED ORDER — ENTRESTO 49-51 MG PO TABS: 1.0000 | ORAL_TABLET | Freq: Two times a day (BID) | ORAL | 11 refills | Status: DC

## 2020-07-12 NOTE — Telephone Encounter (Signed)
Pt's labs from today were stable, per Dr Aundra Dubin continue current medications, including entresto 49/51 mg BID  Spoke w/Stephanie at [place], [place] [phone]) she is aware, she states the 97/103 mg tabs have already been shipped to patient but she will cancel order so it can not be refilled, new rx faxed to her at [phone]  Called and spoke w/pt, he is aware, agreeable, and verbalized understanding, he is aware that when he gets the 97/103 mg tablets to take cut it in half and take 1/2 tab BID.

## 2020-07-12 NOTE — Telephone Encounter (Signed)
Pt in for lab work today and ask for help with his medications. Pt states he is about to run out of Entresto as it was increased from 1/2 tab to 1 tab and he hasn't got new shipment from [place] yet. Upon review of chart pt's Delene Loll was increased to 97/103 mg BID on 06/20/20 per Dr Aundra Dubin, as Dr Aundra Dubin believed that pt was on 49/51 mg BID at time. As pt continues to explain and shows his paperwork from the [place] pt had the 49/51 mg tablets but was only taking 1/2 tab BID. Since appt 1/6 pt has been taking a full tablet BID for a dose of 49/51 mg BID. Recent lab work showed elevated K and spironolactone was decreased and that is why pt is here for repeat labs today.Updated pt's med list to what he is currently taking and advised him I will call him once we get lab results back to see if we need to make any other adjustments. Pt aware, agreeable, and verbalized understanding. Did provide pt with samples of the 49/51 mg tabs as pt is about to run out.

## 2020-07-12 NOTE — Addendum Note (Signed)
Addended by: Scarlette Calico on: 07/12/2020 05:09 PM   Modules accepted: Orders

## 2020-07-12 NOTE — Telephone Encounter (Signed)
Medication Samples have been provided to the patient.  Drug name: Delene Loll       Strength: 49/51mg         Qty: 1  LOT: [id number]  Exp.Date: 12/23  Dosing instructions: take 1 tab Twice daily   The patient has been instructed regarding the correct time, dose, and frequency of taking this medication, including desired effects and most common side effects.   Philomena Buttermore 10:20 AM 07/12/2020   Updated med list given and reviewed with pt, advised I will call him later today with lab results and discuss any changes that may be needed

## 2020-07-22 DIAGNOSIS — E1129 Type 2 diabetes mellitus with other diabetic kidney complication: Secondary | ICD-10-CM | POA: Diagnosis not present

## 2020-07-22 DIAGNOSIS — I5022 Chronic systolic (congestive) heart failure: Secondary | ICD-10-CM | POA: Diagnosis not present

## 2020-07-22 DIAGNOSIS — Z79899 Other long term (current) drug therapy: Secondary | ICD-10-CM | POA: Diagnosis not present

## 2020-07-22 DIAGNOSIS — N183 Chronic kidney disease, stage 3 unspecified: Secondary | ICD-10-CM | POA: Diagnosis not present

## 2020-07-29 DIAGNOSIS — E1122 Type 2 diabetes mellitus with diabetic chronic kidney disease: Secondary | ICD-10-CM | POA: Diagnosis not present

## 2020-07-29 DIAGNOSIS — N183 Chronic kidney disease, stage 3 unspecified: Secondary | ICD-10-CM | POA: Diagnosis not present

## 2020-07-29 DIAGNOSIS — R7309 Other abnormal glucose: Secondary | ICD-10-CM | POA: Diagnosis not present

## 2020-07-29 DIAGNOSIS — I5022 Chronic systolic (congestive) heart failure: Secondary | ICD-10-CM | POA: Diagnosis not present

## 2020-08-06 ENCOUNTER — Ambulatory Visit: Payer: Medicare Other | Admitting: Cardiology

## 2020-08-06 ENCOUNTER — Other Ambulatory Visit: Payer: Self-pay

## 2020-08-06 ENCOUNTER — Encounter: Payer: Self-pay | Admitting: Cardiology

## 2020-08-06 VITALS — BP 106/74 | HR 62 | Ht 69.0 in | Wt 201.4 lb

## 2020-08-06 DIAGNOSIS — I4819 Other persistent atrial fibrillation: Secondary | ICD-10-CM | POA: Diagnosis not present

## 2020-08-06 NOTE — Patient Instructions (Signed)
Medication Instructions:  Your physician recommends that you continue on your current medications as directed. Please refer to the Current Medication list given to you today.  *If you need a refill on your cardiac medications before your next appointment, please call your pharmacy*   Lab Work: None ordered   Testing/Procedures: None ordered   Follow-Up: At [facility], you and your health needs are our priority.  As part of our continuing mission to provide you with exceptional heart care, we have created designated Provider Care Teams.  These Care Teams include your primary Cardiologist (physician) and Advanced Practice Providers (APPs -  Physician Assistants and Nurse Practitioners) who all work together to provide you with the care you need, when you need it.  We recommend signing up for the patient portal called "MyChart".  Sign up information is provided on this After Visit Summary.  MyChart is used to connect with patients for Virtual Visits (Telemedicine).  Patients are able to view lab/test results, encounter notes, upcoming appointments, etc.  Non-urgent messages can be sent to your provider as well.   To learn more about what you can do with MyChart, go to [email or web address].    Your next appointment:   6 month(s)  The format for your next appointment:   In Person  Provider:   Will Camnitz, MD   Thank you for choosing [facility]!!   Daryon Remmert, RN [phone]     

## 2020-08-06 NOTE — Progress Notes (Signed)
Electrophysiology Office Note   Date:  08/06/2020   ID:  Chris Guerrero, Chris Guerrero January 13, 1941, MRN [id number]  PCP:  [name], MD  Cardiologist:  Aundra Dubin Primary Electrophysiologist:  Luca Burston Meredith Leeds, MD    Chief Complaint: AF   History of Present Illness: Chris Guerrero is a 80 y.o. male who is being seen today for the evaluation of AF at the request of [name], MD. Presenting today for electrophysiology evaluation.  He has a history of hypertension hyperlipidemia.  He reported that hospital with complaints of cough and dyspnea on exertion as well as progressive edema.  He was noted to be in atrial fibrillation and was started on amiodarone.  Echo showed an ejection fraction of less than 20% with diffuse hypokinesis.  He required milrinone for low output heart failure.  Right and left heart catheterizations were done which showed elevated filling pressures.  TEE and cardioversion 08/07/2019, but had a left atrial appendage thrombus.  He had a repeat cardioversion 09/08/2019 with restoration of sinus rhythm.  He is now status post AF ablation 02/07/2020.  Fortunately his ejection fraction is improved to 50 to 55%.  Today, denies symptoms of palpitations, chest pain, shortness of breath, orthopnea, PND, lower extremity edema, claudication, dizziness, presyncope, syncope, bleeding, or neurologic sequela. The patient is tolerating medications without difficulties.  Since last being seen he has done well.  He is not aware of any further episodes of atrial fibrillation.  He is able to all of his daily activities restriction.  Past Medical History:  Diagnosis Date  . Atrial fibrillation ([place])   . BPH with obstruction/lower urinary tract symptoms   . CHF (congestive heart failure) ([place])   . CKD (chronic kidney disease), stage III ([place])   . Diabetes mellitus without complication ([place])   . History of kidney stones   . Hyperlipemia   . Hypertension   . Nonischemic cardiomyopathy [facility])    Past  Surgical History:  Procedure Laterality Date  . ATRIAL FIBRILLATION ABLATION N/A 02/07/2020   Procedure: ATRIAL FIBRILLATION ABLATION;  Surgeon: Constance Haw, MD;  Location: [place] CV LAB;  Service: Cardiovascular;  Laterality: N/A;  . CARDIOVERSION N/A 09/08/2019   Procedure: CARDIOVERSION;  Surgeon: Larey Dresser, MD;  Location: [facility] ENDOSCOPY;  Service: Cardiovascular;  Laterality: N/A;  . CATARACT EXTRACTION W/ INTRAOCULAR LENS IMPLANT    . CHOLECYSTECTOMY    . COLONOSCOPY  09/04/2010   RMR: diverticula, cecal polyp = tubular adenoma; Repeat in 5 years  . COLONOSCOPY N/A 10/10/2015   Procedure: COLONOSCOPY;  Surgeon: Daneil Dolin, MD;  Location: [facility] ENDO SU[facility];  Service: Endoscopy;  Laterality: N/A;  1300  . HERNIA REPAIR    . RIGHT/LEFT HEART CATH AND CORONARY ANGIOGRAPHY N/A 08/04/2019   Procedure: RIGHT/LEFT HEART CATH AND CORONARY ANGIOGRAPHY;  Surgeon: Larey Dresser, MD;  Location: [place] CV LAB;  Service: Cardiovascular;  Laterality: N/A;  . TEE WITHOUT CARDIOVERSION N/A 08/07/2019   Procedure: TRANSESOPHAGEAL ECHOCARDIOGRAM (TEE);  Surgeon: Larey Dresser, MD;  Location: [facility] ENDOSCOPY;  Service: Cardiovascular;  Laterality: N/A;  . TEE WITHOUT CARDIOVERSION N/A 09/08/2019   Procedure: TRANSESOPHAGEAL ECHOCARDIOGRAM (TEE);  Surgeon: Larey Dresser, MD;  Location: [facility] ENDOSCOPY;  Service: Cardiovascular;  Laterality: N/A;  . TRANSURETHRAL RESECTION OF PROSTATE N/A 11/27/2019   Procedure: TRANSURETHRAL RESECTION OF THE PROSTATE (TURP);  Surgeon: Kathie Rhodes, MD;  Location: [facility] ORS;  Service: Urology;  Laterality: N/A;     Current Outpatient Medications  Medication Sig  Dispense Refill  . apixaban (ELIQUIS) 5 MG TABS tablet Take 1 tablet (5 mg total) by mouth 2 (two) times daily. 60 tablet 11  . atorvastatin (LIPITOR) 10 MG tablet Take 10 mg by mouth at bedtime.     . carvedilol (COREG) 6.25 MG tablet Take 1.5 tablets (9.375 mg total) by mouth 2 (two) times daily  with a meal. 90 tablet 11  . empagliflozin (JARDIANCE) 25 MG TABS tablet Take 25 mg by mouth daily.    . metFORMIN (GLUCOPHAGE) 500 MG tablet Take 1 tablet by mouth in the morning and at bedtime.    . Multiple Vitamin (MULTIVITAMIN WITH MINERALS) TABS tablet Take 1 tablet by mouth daily.    . sacubitril-valsartan (ENTRESTO) 49-51 MG Take 1 tablet by mouth 2 (two) times daily. 60 tablet 11  . spironolactone (ALDACTONE) 25 MG tablet Take 0.5 tablets (12.5 mg total) by mouth daily. 45 tablet 3  . traZODone (DESYREL) 50 MG tablet Take 50 mg by mouth at bedtime.     No current facility-administered medications for this visit.    Allergies:   Patient has no known allergies.   Social History:  The patient  reports that he has been smoking cigars. His smokeless tobacco use includes chew. He reports current alcohol use of about 1.0 standard drink of alcohol per week. He reports that he does not use drugs.   Family History:  The patient's family history includes Diabetes in his father and mother; Heart disease in his father and mother; Hypertension in his father and mother; Stroke in his father.   ROS:  Please see the history of present illness.   Otherwise, review of systems is positive for none.   All other systems are reviewed and negative.   PHYSICAL EXAM: VS:  BP 106/74   Pulse 62   Ht 5\' 9"  (1.753 m)   Wt 201 lb 6.4 oz (91.4 kg)   SpO2 98%   BMI 29.74 kg/m  , BMI Body mass index is 29.74 kg/m. GEN: Well nourished, well developed, in no acute distress  HEENT: normal  Neck: no JVD, carotid bruits, or masses Cardiac: RRR; no murmurs, rubs, or gallops,no edema  Respiratory:  clear to auscultation bilaterally, normal work of breathing GI: soft, nontender, nondistended, + BS MS: no deformity or atrophy  Skin: warm and dry Neuro:  Strength and sensation are intact Psych: euthymic mood, full affect  EKG:  EKG is ordered today. Personal review of the ekg ordered shows sinus rhythm, rate  62  Recent Labs: 08/28/2019: B Natriuretic Peptide 362.0 01/08/2020: ALT 35; TSH 5.040 06/20/2020: Hemoglobin 13.9; Platelets 238 07/12/2020: BUN 28; Creatinine, Ser 1.50; Potassium 4.9; Sodium 135    Lipid Panel  No results found for: CHOL, TRIG, HDL, CHOLHDL, VLDL, LDLCALC, LDLDIRECT   Wt Readings from Last 3 Encounters:  08/06/20 201 lb 6.4 oz (91.4 kg)  06/20/20 197 lb 6.4 oz (89.5 kg)  05/13/20 193 lb (87.5 kg)      Other studies Reviewed: Additional studies/ records that were reviewed today include: RHC/LHC 08/04/19  Review of the above records today demonstrates:  1. Mildly elevated filling pressures.   2. Excellent cardiac output on milrinone.  3. No significant coronary disease.   TTE 04/03/2021 1. Left ventricular ejection fraction, by estimation, is 50 to 55%. The  left ventricle has low normal function. The left ventricle has no regional  wall motion abnormalities. Left ventricular diastolic parameters are  consistent with Grade I diastolic  dysfunction (impaired  relaxation).  2. Right ventricular systolic function is normal. The right ventricular  size is normal.  3. The mitral valve is normal in structure. No evidence of mitral valve  regurgitation. No evidence of mitral stenosis.  4. The aortic valve is tricuspid. Aortic valve regurgitation is not  visualized. Mild aortic valve sclerosis is present, with no evidence of  aortic valve stenosis.  5. The inferior vena cava is normal in size with greater than 50%  respiratory variability, suggesting right atrial pressure of 3 mmHg.   Cardiac monitor 07/07/2020 personally reviewed 1. Predominantly NSR.  2. 2 short NSVT runs.  3. 8% PVCs.    ASSESSMENT AND PLAN:  1.  Persistent atrial fibrillation: Currently on Eliquis.  CHA2DS2-VASc of 2.  Status post ablation 02/07/2020.   2.  Chronic systolic heart failure: Likely due to a tachycardia mediated cardiomyopathy.  Currently on carvedilol, torsemide, Entresto,  Aldactone.  Ejection fraction 35% on cardiac MRI.  Urgently has ejection fraction has normalized after ablation.  No changes.  3.  CKD stage III: Plan per primary cardiology  4.  PVCs: Has been taken off of amiodarone.  PVC burden is 8%.  Patient is asymptomatic.  No changes.  Current medicines are reviewed at length with the patient today.   The patient does not have concerns regarding his medicines.  The following changes were made today: None  Labs/ tests ordered today include:  Orders Placed This Encounter  Procedures  . EKG 12-Lead     Disposition:   FU with Quilla Freeze 6 months  Signed, Santonio Speakman Meredith Leeds, MD  08/06/2020 2:36 PM     [address] [place] [place] [address] [phone] (office) [phone] (fax)

## 2020-09-17 ENCOUNTER — Encounter (HOSPITAL_COMMUNITY): Payer: Self-pay | Admitting: Cardiology

## 2020-09-17 ENCOUNTER — Other Ambulatory Visit: Payer: Self-pay

## 2020-09-17 ENCOUNTER — Ambulatory Visit (HOSPITAL_COMMUNITY)
Admission: RE | Admit: 2020-09-17 | Discharge: 2020-09-17 | Disposition: A | Discharge status: Home / Self Care | Payer: Medicare Other | Source: Ambulatory Visit | Attending: Cardiology | Admitting: Cardiology

## 2020-09-17 VITALS — BP 108/68 | HR 66 | Wt 207.0 lb

## 2020-09-17 DIAGNOSIS — N138 Other obstructive and reflux uropathy: Secondary | ICD-10-CM | POA: Insufficient documentation

## 2020-09-17 DIAGNOSIS — I428 Other cardiomyopathies: Secondary | ICD-10-CM | POA: Insufficient documentation

## 2020-09-17 DIAGNOSIS — R000 Tachycardia, unspecified: Secondary | ICD-10-CM | POA: Insufficient documentation

## 2020-09-17 DIAGNOSIS — Z7984 Long term (current) use of oral hypoglycemic drugs: Secondary | ICD-10-CM | POA: Diagnosis not present

## 2020-09-17 DIAGNOSIS — Z79899 Other long term (current) drug therapy: Secondary | ICD-10-CM | POA: Insufficient documentation

## 2020-09-17 DIAGNOSIS — I493 Ventricular premature depolarization: Secondary | ICD-10-CM | POA: Diagnosis not present

## 2020-09-17 DIAGNOSIS — I4891 Unspecified atrial fibrillation: Secondary | ICD-10-CM | POA: Diagnosis not present

## 2020-09-17 DIAGNOSIS — Z8249 Family history of ischemic heart disease and other diseases of the circulatory system: Secondary | ICD-10-CM | POA: Diagnosis not present

## 2020-09-17 DIAGNOSIS — N183 Chronic kidney disease, stage 3 unspecified: Secondary | ICD-10-CM | POA: Diagnosis not present

## 2020-09-17 DIAGNOSIS — E1122 Type 2 diabetes mellitus with diabetic chronic kidney disease: Secondary | ICD-10-CM | POA: Diagnosis not present

## 2020-09-17 DIAGNOSIS — Z7901 Long term (current) use of anticoagulants: Secondary | ICD-10-CM | POA: Diagnosis not present

## 2020-09-17 DIAGNOSIS — I130 Hypertensive heart and chronic kidney disease with heart failure and stage 1 through stage 4 chronic kidney disease, or unspecified chronic kidney disease: Secondary | ICD-10-CM | POA: Insufficient documentation

## 2020-09-17 DIAGNOSIS — I480 Paroxysmal atrial fibrillation: Secondary | ICD-10-CM | POA: Insufficient documentation

## 2020-09-17 DIAGNOSIS — Z833 Family history of diabetes mellitus: Secondary | ICD-10-CM | POA: Insufficient documentation

## 2020-09-17 DIAGNOSIS — R338 Other retention of urine: Secondary | ICD-10-CM | POA: Insufficient documentation

## 2020-09-17 DIAGNOSIS — I5022 Chronic systolic (congestive) heart failure: Secondary | ICD-10-CM | POA: Insufficient documentation

## 2020-09-17 DIAGNOSIS — R42 Dizziness and giddiness: Secondary | ICD-10-CM | POA: Diagnosis not present

## 2020-09-17 DIAGNOSIS — Z9079 Acquired absence of other genital organ(s): Secondary | ICD-10-CM | POA: Diagnosis not present

## 2020-09-17 LAB — BASIC METABOLIC PANEL
Anion gap: 5 (ref 5–15)
BUN: 23 mg/dL (ref 8–23)
CO2: 27 mmol/L (ref 22–32)
Calcium: 9.7 mg/dL (ref 8.9–10.3)
Chloride: 104 mmol/L (ref 98–111)
Creatinine, Ser: 1.34 mg/dL — ABNORMAL HIGH (ref 0.61–1.24)
GFR, Estimated: 53 mL/min — ABNORMAL LOW (ref >60)
Glucose, Bld: 156 mg/dL — ABNORMAL HIGH (ref 70–99)
Potassium: 5.2 mmol/L — ABNORMAL HIGH (ref 3.5–5.1)
Sodium: 136 mmol/L (ref 135–145)

## 2020-09-17 LAB — CBC
HCT: 43.3 % (ref 39.0–52.0)
Hemoglobin: 13.7 g/dL (ref 13.0–17.0)
MCH: 28.1 pg (ref 26.0–34.0)
MCHC: 31.6 g/dL (ref 30.0–36.0)
MCV: 88.9 fL (ref 80.0–100.0)
Platelets: 247 10*3/uL (ref 150–400)
RBC: 4.87 MIL/uL (ref 4.22–5.81)
RDW: 14.3 % (ref 11.5–15.5)
WBC: 6.4 10*3/uL (ref 4.0–10.5)
nRBC: 0 % (ref 0.0–0.2)

## 2020-09-17 MED ORDER — ENTRESTO 49-51 MG PO TABS: 1.0000 | ORAL_TABLET | Freq: Two times a day (BID) | ORAL | 11 refills | Status: AC

## 2020-09-17 MED ORDER — CARVEDILOL 12.5 MG PO TABS
12.5000 mg | ORAL_TABLET | Freq: Two times a day (BID) | ORAL | 6 refills | Status: DC
Start: 2020-09-17 — End: 2021-03-11

## 2020-09-17 MED ORDER — ENTRESTO 49-51 MG PO TABS: 1.0000 | ORAL_TABLET | Freq: Two times a day (BID) | ORAL | 6 refills | Status: DC

## 2020-09-17 MED ORDER — CARVEDILOL 12.5 MG PO TABS
12.5000 mg | ORAL_TABLET | Freq: Two times a day (BID) | ORAL | 6 refills | Status: DC
Start: 2020-09-17 — End: 2020-09-17

## 2020-09-17 NOTE — Patient Instructions (Signed)
Increase Carvedilol 12.5 mg Twice daily   **I sent new prescriptions to the [place] for Carvedilol 12.5 mg tablets and Entresto 49/51 mg tablets**  Labs done today we will call you for abnormal results  Your physician recommends that you return for lab work in: 3 months  Your physician recommends that you schedule a follow-up appointment in: 6 months  If you have any questions or concerns before your next appointment please send [place] a message through [place] or call our office at [phone].    TO LEAVE A MESSAGE FOR THE NURSE SELECT OPTION 2, PLEASE LEAVE A MESSAGE INCLUDING: . YOUR NAME . DATE OF BIRTH . CALL BACK NUMBER . REASON FOR CALL**this is important as we prioritize the call backs  [place] AS LONG AS YOU CALL BEFORE 4:00 PM  At the [facility], you and your health needs are our priority. As part of our continuing mission to provide you with exceptional heart care, we have created designated Provider Care Teams. These Care Teams include your primary Cardiologist (physician) and Advanced Practice Providers (APPs- Physician Assistants and Nurse Practitioners) who all work together to provide you with the care you need, when you need it.   You may see any of the following providers on your designated Care Team at your next follow up: [name] Dr Glori Bickers . Dr Loralie Champagne . Dr Vickki Muff . Darrick Grinder, NP . Lyda Jester, [place] . Audry Riles, PharmD   Please be sure to bring in all your medications bottles to every appointment.

## 2020-09-17 NOTE — Progress Notes (Signed)
t  Advanced Heart Failure Clinic Note    PCP: [name], MD PCP-Cardiologist: Kate Sable, MD (Inactive)  AHF: Dr. Aundra Dubin  Urology: Dr Karsten Ro   HPI: 80 y.o. male w/prior history of HTN and hyperlipidemia, who initially reported to [facility] w/ complaints of about 1 month of cough and dyspnea with exertion as well as progressive edema. No chest pain. He saw his PCPprior to coming [facility] 08/01/19 and was noted to be in atrial fibrillation with RVR and he was then sent to [facility] for admission. Hs-TnI was flat at 25 =>27. BNP elevated, 1,068.COVID negative.He was initially tried on diltiazem gtt but became hypotensiveand changed toamiodarone gtt. Echo was done, showing EF <20% with diffuse hypokinesis, severely decreased RV function.He was transferred to [facility] on 2/18for HF work up and cath.   On arrival to [facility], he was found to be volume overloaded w/ soft SBPs and rising SCr up to 1.9 with attempts at diuresis, concerning for low output. PICC line was placed to measure co-ox and CVP. Initial co-ox low at 46%. He was started on milrinone and IV Lasix. IV amiodarone continued for afib + IV heparin.RHC/LHC 08/04/19 with no coronary disease, mildly elevated filling pressures.Co-ox improved w/ milrinone. He was ultimately weaned off milrinone and co-ox remained stable. He diuresed well w/ IV Lasix and later changed to torsemide. SCr improved w/ diuresis, down to 1.32.   He was set up for TEE/DCCV on 08/07/19 and found to have LAA thrombus on TEE. DCCV canceled. IV amiodarone was discontinued and he was changed to PO, 200 mg bid. Placed on Eliquis 5 bid.ASA discontinued.Plan to repeat TEE/DCCV after 1 month of chronic anticoagulation. He was advised to continue amiodarone 200 mg bid x 7 days, followed by 200 mg daily.   Also of note, he develop urinary retention due to BPH. Had over 500cc on bladder scan.Multiple attempts were made by the nursing staff to place a foley which were  unsuccessful. Urology was consulted and placed16 french council foley catheter. Subsequently, he had mild hematuria which ultimately cleared. Urology recommended foley to continue on discharge with plans to be followed closely in outpatient clinic. He was discharged from the hospital 08/09/19.   He had f/u w/ urology 08/15/19 and foley removed. After returning home, he redeveloped urinary retention and went to ED next day. Foley cath reinserted.   S/P DC-CV 09/08/19 with restoration of NSR.   He had TURP in 6/21.   Cardiac MRI in 6/21 showed EF 35%, LGE pattern consistent with lateral infarction.   He saw Dr. Curt Bears and atrial fibrillation ablation was recommended. S/P A fib ablation 02/2020. Zio patch was placed to assess PVC burden. Had 16% PVC burden.    Echo in 1/22 showed EF up to 50-55% with normal RV.  Zio patch was repeated in 1/22, now 8% PVCs.   Patient returns for followup of CHF.  He is in NSR today.  Weight is up but denies peripheral edema.  No dyspnea walking on flat ground.  He can walk for 20 minutes without problems.  No chest pain.  Rare lightheadedness with standing.  No orthopnea/PND.     Labs (3/21): K 4.7, creatinine 1.6, TSH elevated, free T3/free T4 normal, LFTs normal.  Labs (5/21): LFTs normal, digoxin 0.8, TSH mildly elevated, free T3 and free T4 normal.  Labs (6/21): K 4.9 => 5.3, creatinine 1.77 => 1.65 Labs (02/01/20): K 5.1 Creatinine 1.5  Labs (10/21): K 4.6, creatinine 1.34 Labs (1/22): K  4.9, creatinine 1.5  ECG: NSR, normal (personally reviewed)  PMH: 1. Atrial fibrillation: Paroxysmal.   - DCCV to NSR 3/21.  - Atrial fibrillation ablation 9/21 2. BPH with urinary retention - TURP 6/21 3. Type 2 diabetes 4. HTN 5. Nephrolithiasis 6. CKD stage 3 7. Chronic systolic CHF: Nonischemic cardiomyopathy.  - Echo (2/21): EF < 20%, with severely decreased RV systolic function. - LHC (2/21): No significant CAD - Cardiac MRI (6/21): EF 35%, LGE pattern  consistent with lateral infarction.  - Echo (1/22): EF 50-55%, normal RV.  8. PVCs: 8/21 Zio patch with 16% PVCs.  - Zio patch (1/22): 8% PVCs   Current Outpatient Medications  Medication Sig Dispense Refill  . apixaban (ELIQUIS) 5 MG TABS tablet Take 1 tablet (5 mg total) by mouth 2 (two) times daily. 60 tablet 11  . atorvastatin (LIPITOR) 10 MG tablet Take 10 mg by mouth at bedtime.     . empagliflozin (JARDIANCE) 25 MG TABS tablet Take 25 mg by mouth daily.    . metFORMIN (GLUCOPHAGE) 500 MG tablet Take 1 tablet by mouth in the morning and at bedtime.    . Multiple Vitamin (MULTIVITAMIN WITH MINERALS) TABS tablet Take 1 tablet by mouth daily.    [name] spironolactone (ALDACTONE) 25 MG tablet Take 0.5 tablets (12.5 mg total) by mouth daily. 45 tablet 3  . traZODone (DESYREL) 50 MG tablet Take 50 mg by mouth at bedtime.    . carvedilol (COREG) 12.5 MG tablet Take 1 tablet (12.5 mg total) by mouth 2 (two) times daily with a meal. 60 tablet 6  . sacubitril-valsartan (ENTRESTO) 49-51 MG Take 1 tablet by mouth 2 (two) times daily. 60 tablet 11   No current facility-administered medications for this encounter.    No Known Allergies    Social History   Socioeconomic History  . Marital status: Divorced    Spouse name: Not on file  . Number of children: Not on file  . Years of education: Not on file  . Highest education level: Not on file  Occupational History  . Not on file  Tobacco Use  . Smoking status: Light Tobacco Smoker    Types: Cigars  . Smokeless tobacco: Current User    Types: Chew  . Tobacco comment: rarely chews tobacco cigar  Vaping Use  . Vaping Use: Never used  Substance and Sexual Activity  . Alcohol use: Yes    Alcohol/week: 1.0 standard drink    Types: 1 Cans of beer per week  . Drug use: No  . Sexual activity: Not Currently  Other Topics Concern  . Not on file  Social History Narrative  . Not on file   Social Determinants of Health   Financial Resource  Strain: Not on file  Food Insecurity: Not on file  Transportation Needs: Not on file  Physical Activity: Not on file  Stress: Not on file  Social Connections: Not on file  Intimate Partner Violence: Not on file      Family History  Problem Relation Age of Onset  . Diabetes Mother   . Hypertension Mother   . Heart disease Mother   . Diabetes Father   . Hypertension Father   . Heart disease Father   . Stroke Father   . Colon cancer Neg Hx     Vitals:   09/17/20 0856  BP: 108/68  Pulse: 66  SpO2: 98%  Weight: 93.9 kg (207 lb)   Wt Readings from Last 3 Encounters:  09/17/20  93.9 kg (207 lb)  08/06/20 91.4 kg (201 lb 6.4 oz)  06/20/20 89.5 kg (197 lb 6.4 oz)   PHYSICAL EXAM: General: NAD Neck: No JVD, no thyromegaly or thyroid nodule.  Lungs: Clear to auscultation bilaterally with normal respiratory effort. CV: Nondisplaced PMI.  Heart regular S1/S2, no S3/S4, no murmur.  No peripheral edema.  No carotid bruit.  Normal pedal pulses.  Abdomen: Soft, nontender, no hepatosplenomegaly, no distention.  Skin: Intact without lesions or rashes.  Neurologic: Alert and oriented x 3.  Psych: Normal affect. Extremities: No clubbing or cyanosis.  HEENT: Normal.   ASSESSMENT & PLAN: 1. Chronic systolic CHF: Echo 5/40 with EF <20%, severely decreased RV function. Nonischemic cardiomyopathy, ?tachycardia-mediated due to atrial fibrillation of uncertain duration. He also has history of frequent PVCs (16% on 8/21 Zio patch). He required milrinone during 2/21 admission but was titrated off.  Coronary angiography in 2/21 with no significant coronary disease. Cardiac MRI in 6/21 with EF up to 35%, LGE pattern consistent with lateral MI (surprising given no significant CAD on cath).  Echo in 1/22 with EF improved to 50-55%, normal RV.  NYHA class II symptoms, he is not volume overloaded on exam.  - Increase Coreg to 12.5 mg bid (should also help with PVCs).   - He is no longer taking  torsemide.  - Continue spironolactone 25 mg daily.  - Continue Entresto 49/51 bid.  BMET today.   - Continue empagliflozin with CKD and CHF 2. Atrial fibrillation: Paroxysmal.  Admitted with afib/RVR of uncertain duration 9/81. Unsure if atrial fibrillation/RVR was cause of CMP (tachy-mediated) or an effect of the cardiomyopathy.  TEE on 2/22 showed LA appendage thrombus. He was anticoagulated, then underwent DC-CV on 09/08/19 with conversion to NSR. Atrial fibrillation ablation in 9/21.  He is in NSR today.  - He is now off amiodarone.   - Continue apixaban, CBC today.  3. CKD stage 3: BMET today. 4. BPH/urinary obstruction: Has had TURP.  5. PVCs: Zio patch 01/2020 with 16% PVCs.  He is now off amiodarone.  Repeat Zio patch with 8% PVCs in 1/22.  -  Increase Coreg to 12.5 mg bid.   BMET in 3 months, followup 6 months.    Loralie Champagne, MD 09/17/20

## 2020-09-18 ENCOUNTER — Telehealth (HOSPITAL_COMMUNITY): Payer: Self-pay

## 2020-09-18 DIAGNOSIS — I5022 Chronic systolic (congestive) heart failure: Secondary | ICD-10-CM

## 2020-09-18 NOTE — Telephone Encounter (Signed)
Pt aware of results. Pt not taking any k supplements. Advised to follow low k diet. Suggestions provided. Will repeat labs in 2 weeks. Verbalized understanding.

## 2020-09-18 NOTE — Telephone Encounter (Signed)
-----   Message from Larey Dresser, MD sent at 09/17/2020  2:49 PM EDT ----- Low K diet, stop any K supplement.  BMET in 2 wks.

## 2020-10-01 ENCOUNTER — Other Ambulatory Visit: Payer: Self-pay

## 2020-10-01 ENCOUNTER — Ambulatory Visit (HOSPITAL_COMMUNITY)
Admission: RE | Admit: 2020-10-01 | Discharge: 2020-10-01 | Disposition: A | Discharge status: Home / Self Care | Payer: Medicare Other | Source: Ambulatory Visit | Attending: Cardiology | Admitting: Cardiology

## 2020-10-01 DIAGNOSIS — I5022 Chronic systolic (congestive) heart failure: Secondary | ICD-10-CM | POA: Insufficient documentation

## 2020-10-01 LAB — BASIC METABOLIC PANEL
Anion gap: 6 (ref 5–15)
BUN: 23 mg/dL (ref 8–23)
CO2: 25 mmol/L (ref 22–32)
Calcium: 9.4 mg/dL (ref 8.9–10.3)
Chloride: 107 mmol/L (ref 98–111)
Creatinine, Ser: 1.31 mg/dL — ABNORMAL HIGH (ref 0.61–1.24)
GFR, Estimated: 55 mL/min — ABNORMAL LOW (ref >60)
Glucose, Bld: 149 mg/dL — ABNORMAL HIGH (ref 70–99)
Potassium: 4.5 mmol/L (ref 3.5–5.1)
Sodium: 138 mmol/L (ref 135–145)

## 2020-10-23 DIAGNOSIS — I5022 Chronic systolic (congestive) heart failure: Secondary | ICD-10-CM | POA: Diagnosis not present

## 2020-10-23 DIAGNOSIS — N1832 Chronic kidney disease, stage 3b: Secondary | ICD-10-CM | POA: Diagnosis not present

## 2020-10-23 DIAGNOSIS — E1129 Type 2 diabetes mellitus with other diabetic kidney complication: Secondary | ICD-10-CM | POA: Diagnosis not present

## 2020-10-23 DIAGNOSIS — I480 Paroxysmal atrial fibrillation: Secondary | ICD-10-CM | POA: Diagnosis not present

## 2020-10-23 DIAGNOSIS — Z79899 Other long term (current) drug therapy: Secondary | ICD-10-CM | POA: Diagnosis not present

## 2020-10-29 DIAGNOSIS — I5022 Chronic systolic (congestive) heart failure: Secondary | ICD-10-CM | POA: Diagnosis not present

## 2020-10-29 DIAGNOSIS — R7309 Other abnormal glucose: Secondary | ICD-10-CM | POA: Diagnosis not present

## 2020-10-29 DIAGNOSIS — N1831 Chronic kidney disease, stage 3a: Secondary | ICD-10-CM | POA: Diagnosis not present

## 2020-10-29 DIAGNOSIS — E1122 Type 2 diabetes mellitus with diabetic chronic kidney disease: Secondary | ICD-10-CM | POA: Diagnosis not present

## 2020-11-12 DIAGNOSIS — E785 Hyperlipidemia, unspecified: Secondary | ICD-10-CM | POA: Diagnosis not present

## 2020-11-12 DIAGNOSIS — E1129 Type 2 diabetes mellitus with other diabetic kidney complication: Secondary | ICD-10-CM | POA: Diagnosis not present

## 2020-11-12 DIAGNOSIS — I10 Essential (primary) hypertension: Secondary | ICD-10-CM | POA: Diagnosis not present

## 2020-12-31 ENCOUNTER — Other Ambulatory Visit: Payer: Self-pay | Admitting: Cardiology

## 2020-12-31 DIAGNOSIS — I5022 Chronic systolic (congestive) heart failure: Secondary | ICD-10-CM | POA: Diagnosis not present

## 2021-01-01 LAB — BASIC METABOLIC PANEL
BUN/Creatinine Ratio: 17 (ref 10–24)
BUN: 23 mg/dL (ref 8–27)
CO2: 21 mmol/L (ref 20–29)
Calcium: 9.7 mg/dL (ref 8.6–10.2)
Chloride: 105 mmol/L (ref 96–106)
Creatinine, Ser: 1.32 mg/dL — ABNORMAL HIGH (ref 0.76–1.27)
Glucose: 133 mg/dL — ABNORMAL HIGH (ref 65–99)
Potassium: 5.2 mmol/L (ref 3.5–5.2)
Sodium: 140 mmol/L (ref 134–144)
eGFR: 54 mL/min/{1.73_m2} — ABNORMAL LOW (ref >59)

## 2021-01-01 LAB — SPECIMEN STATUS REPORT

## 2021-01-07 ENCOUNTER — Telehealth (HOSPITAL_COMMUNITY): Payer: Self-pay | Admitting: Cardiology

## 2021-01-07 DIAGNOSIS — I5022 Chronic systolic (congestive) heart failure: Secondary | ICD-10-CM

## 2021-01-07 NOTE — Telephone Encounter (Signed)
Abnormal labs received via fax Labs drawn 12/31/20 Cr 1.32 BUN 23 K 5.2  Per Allena Katz, NP Limit K rich foods  Stop K supplements Repeat bmet in 10-14 days    [facility]

## 2021-01-09 NOTE — Telephone Encounter (Signed)
Pt aware and voiced understanding Repeat labs 8/11

## 2021-01-13 ENCOUNTER — Other Ambulatory Visit (HOSPITAL_COMMUNITY): Payer: Self-pay | Admitting: Cardiology

## 2021-01-23 ENCOUNTER — Other Ambulatory Visit: Payer: Self-pay

## 2021-01-23 ENCOUNTER — Ambulatory Visit (HOSPITAL_COMMUNITY)
Admission: RE | Admit: 2021-01-23 | Discharge: 2021-01-23 | Disposition: A | Discharge status: Home / Self Care | Payer: Medicare Other | Source: Ambulatory Visit | Attending: Internal Medicine | Admitting: Internal Medicine

## 2021-01-23 DIAGNOSIS — I5022 Chronic systolic (congestive) heart failure: Secondary | ICD-10-CM | POA: Insufficient documentation

## 2021-01-23 LAB — BASIC METABOLIC PANEL
Anion gap: 8 (ref 5–15)
BUN: 15 mg/dL (ref 8–23)
CO2: 24 mmol/L (ref 22–32)
Calcium: 9.4 mg/dL (ref 8.9–10.3)
Chloride: 105 mmol/L (ref 98–111)
Creatinine, Ser: 1.26 mg/dL — ABNORMAL HIGH (ref 0.61–1.24)
GFR, Estimated: 57 mL/min — ABNORMAL LOW (ref >60)
Glucose, Bld: 135 mg/dL — ABNORMAL HIGH (ref 70–99)
Potassium: 4.8 mmol/L (ref 3.5–5.1)
Sodium: 137 mmol/L (ref 135–145)

## 2021-01-29 DIAGNOSIS — I480 Paroxysmal atrial fibrillation: Secondary | ICD-10-CM | POA: Diagnosis not present

## 2021-01-29 DIAGNOSIS — N1832 Chronic kidney disease, stage 3b: Secondary | ICD-10-CM | POA: Diagnosis not present

## 2021-01-29 DIAGNOSIS — Z79899 Other long term (current) drug therapy: Secondary | ICD-10-CM | POA: Diagnosis not present

## 2021-01-29 DIAGNOSIS — E1129 Type 2 diabetes mellitus with other diabetic kidney complication: Secondary | ICD-10-CM | POA: Diagnosis not present

## 2021-01-29 DIAGNOSIS — I5022 Chronic systolic (congestive) heart failure: Secondary | ICD-10-CM | POA: Diagnosis not present

## 2021-02-05 DIAGNOSIS — E785 Hyperlipidemia, unspecified: Secondary | ICD-10-CM | POA: Diagnosis not present

## 2021-02-05 DIAGNOSIS — I5022 Chronic systolic (congestive) heart failure: Secondary | ICD-10-CM | POA: Diagnosis not present

## 2021-02-05 DIAGNOSIS — R7309 Other abnormal glucose: Secondary | ICD-10-CM | POA: Diagnosis not present

## 2021-02-05 DIAGNOSIS — E1122 Type 2 diabetes mellitus with diabetic chronic kidney disease: Secondary | ICD-10-CM | POA: Diagnosis not present

## 2021-02-05 DIAGNOSIS — N1832 Chronic kidney disease, stage 3b: Secondary | ICD-10-CM | POA: Diagnosis not present

## 2021-03-08 ENCOUNTER — Other Ambulatory Visit (HOSPITAL_COMMUNITY): Payer: Self-pay | Admitting: Cardiology

## 2021-03-10 ENCOUNTER — Other Ambulatory Visit (HOSPITAL_COMMUNITY): Payer: Self-pay | Admitting: Cardiology

## 2021-03-10 ENCOUNTER — Ambulatory Visit (HOSPITAL_COMMUNITY)
Admission: RE | Admit: 2021-03-10 | Discharge: 2021-03-10 | Disposition: A | Discharge status: Home / Self Care | Payer: Medicare Other | Source: Ambulatory Visit | Attending: Cardiology | Admitting: Cardiology

## 2021-03-10 ENCOUNTER — Encounter (HOSPITAL_COMMUNITY): Payer: Self-pay | Admitting: Cardiology

## 2021-03-10 ENCOUNTER — Other Ambulatory Visit: Payer: Self-pay

## 2021-03-10 VITALS — BP 122/70 | HR 70 | Wt 203.6 lb

## 2021-03-10 DIAGNOSIS — I739 Peripheral vascular disease, unspecified: Secondary | ICD-10-CM | POA: Diagnosis not present

## 2021-03-10 DIAGNOSIS — Z79899 Other long term (current) drug therapy: Secondary | ICD-10-CM | POA: Insufficient documentation

## 2021-03-10 DIAGNOSIS — I493 Ventricular premature depolarization: Secondary | ICD-10-CM | POA: Insufficient documentation

## 2021-03-10 DIAGNOSIS — M1612 Unilateral primary osteoarthritis, left hip: Secondary | ICD-10-CM | POA: Insufficient documentation

## 2021-03-10 DIAGNOSIS — I5042 Chronic combined systolic (congestive) and diastolic (congestive) heart failure: Secondary | ICD-10-CM

## 2021-03-10 DIAGNOSIS — E1122 Type 2 diabetes mellitus with diabetic chronic kidney disease: Secondary | ICD-10-CM | POA: Insufficient documentation

## 2021-03-10 DIAGNOSIS — E785 Hyperlipidemia, unspecified: Secondary | ICD-10-CM | POA: Diagnosis not present

## 2021-03-10 DIAGNOSIS — Z7984 Long term (current) use of oral hypoglycemic drugs: Secondary | ICD-10-CM | POA: Insufficient documentation

## 2021-03-10 DIAGNOSIS — R609 Edema, unspecified: Secondary | ICD-10-CM | POA: Diagnosis not present

## 2021-03-10 DIAGNOSIS — R053 Chronic cough: Secondary | ICD-10-CM | POA: Diagnosis not present

## 2021-03-10 DIAGNOSIS — I480 Paroxysmal atrial fibrillation: Secondary | ICD-10-CM | POA: Diagnosis not present

## 2021-03-10 DIAGNOSIS — N183 Chronic kidney disease, stage 3 unspecified: Secondary | ICD-10-CM | POA: Insufficient documentation

## 2021-03-10 DIAGNOSIS — I130 Hypertensive heart and chronic kidney disease with heart failure and stage 1 through stage 4 chronic kidney disease, or unspecified chronic kidney disease: Secondary | ICD-10-CM | POA: Diagnosis not present

## 2021-03-10 DIAGNOSIS — I428 Other cardiomyopathies: Secondary | ICD-10-CM | POA: Diagnosis not present

## 2021-03-10 DIAGNOSIS — N401 Enlarged prostate with lower urinary tract symptoms: Secondary | ICD-10-CM | POA: Insufficient documentation

## 2021-03-10 DIAGNOSIS — Z8249 Family history of ischemic heart disease and other diseases of the circulatory system: Secondary | ICD-10-CM | POA: Diagnosis not present

## 2021-03-10 DIAGNOSIS — Z7901 Long term (current) use of anticoagulants: Secondary | ICD-10-CM | POA: Diagnosis not present

## 2021-03-10 DIAGNOSIS — R338 Other retention of urine: Secondary | ICD-10-CM | POA: Diagnosis not present

## 2021-03-10 DIAGNOSIS — I5022 Chronic systolic (congestive) heart failure: Secondary | ICD-10-CM | POA: Diagnosis not present

## 2021-03-10 LAB — BASIC METABOLIC PANEL
Anion gap: 10 (ref 5–15)
BUN: 23 mg/dL (ref 8–23)
CO2: 24 mmol/L (ref 22–32)
Calcium: 9.8 mg/dL (ref 8.9–10.3)
Chloride: 104 mmol/L (ref 98–111)
Creatinine, Ser: 1.34 mg/dL — ABNORMAL HIGH (ref 0.61–1.24)
GFR, Estimated: 53 mL/min — ABNORMAL LOW (ref >60)
Glucose, Bld: 155 mg/dL — ABNORMAL HIGH (ref 70–99)
Potassium: 4.3 mmol/L (ref 3.5–5.1)
Sodium: 138 mmol/L (ref 135–145)

## 2021-03-10 NOTE — Progress Notes (Signed)
t  Advanced Heart Failure Clinic Note    PCP: [name], MD PCP-Cardiologist: Kate Sable, MD (Inactive)  AHF: Dr. Aundra Dubin  Urology: Dr Karsten Ro   HPI: 80 y.o. male w/  prior history of HTN and hyperlipidemia, who initially reported to [facility]  w/ complaints of about 1 month of cough and dyspnea with exertion as well as progressive edema. No chest pain. He saw his PCP prior to coming to the ED on 08/01/19 and was noted to be in atrial fibrillation with RVR and he was then sent to [facility] for admission. Hs-TnI was flat at 25 => 27.  BNP elevated, 1,068. COVID negative. He was initially tried on diltiazem gtt but became hypotensive and changed to amiodarone gtt.  Echo was done, showing EF < 20% with diffuse hypokinesis, severely decreased RV function. He was transferred to [facility] on 2/18 for HF work up and cath.    On arrival to [facility], he was found to be volume overloaded w/ soft SBPs and rising SCr up to 1.9 with attempts at diuresis, concerning for low output. PICC line was placed to measure co-ox and CVP. Initial co-ox low at 46%. He was started on milrinone and IV Lasix. IV amiodarone continued for afib + IV heparin. RHC/LHC 08/04/19 with no coronary disease, mildly elevated filling pressures. Co-ox improved w/ milrinone. He was ultimately weaned off milrinone and co-ox remained stable. He diuresed well w/ IV Lasix and later changed to torsemide. SCr improved w/ diuresis, down to 1.32.    He was set up for TEE/DCCV on 08/07/19 and found to have LAA thrombus on TEE. DCCV canceled. IV amiodarone was discontinued and he was changed to PO, 200 mg bid. Placed on Eliquis 5 bid. ASA discontinued. Plan to repeat TEE/DCCV after 1 month of chronic anticoagulation. He was advised to continue amiodarone 200 mg bid x 7 days, followed by 200 mg daily.    Also of note, he develop urinary retention due to BPH. Had over 500cc on bladder scan. Multiple attempts were made by the nursing staff to place a foley which were  unsuccessful. Urology was consulted and placed 16 french council foley catheter. Subsequently, he had mild hematuria which ultimately cleared. Urology recommended foley to continue on discharge with plans to be followed closely in outpatient clinic. He was discharged from the hospital 08/09/19.   He had f/u w/ urology 08/15/19 and foley removed. After returning home, he redeveloped urinary retention and went to ED next day. Foley cath reinserted.   S/P DC-CV 09/08/19 with restoration of NSR.   He had TURP in 6/21.   Cardiac MRI in 6/21 showed EF 35%, LGE pattern consistent with lateral infarction.   He saw Dr. Curt Bears and atrial fibrillation ablation was recommended. S/P A fib ablation 02/2020. Zio patch was placed to assess PVC burden. Had 16% PVC burden.    Echo in 1/22 showed EF up to 50-55% with normal RV.  Zio patch was repeated in 1/22, now 8% PVCs.   Patient returns for followup of CHF.  He is in NSR today. Main complaint is left calf pain with ambulation.  No rest pain or ulcers on feet.  No significant exertional dyspnea.  No chest pain.  No lightheadedness.  Weight is down 4 lbs.  No palpitations.  He also has left hip arthritis.    Labs (3/21): K 4.7, creatinine 1.6, TSH elevated, free T3/free T4 normal, LFTs normal.  Labs (5/21): LFTs normal, digoxin 0.8, TSH mildly elevated, free T3  and free T4 normal.  Labs (6/21): K 4.9 => 5.3, creatinine 1.77 => 1.65 Labs (02/01/20): K 5.1 Creatinine 1.5  Labs (10/21): K 4.6, creatinine 1.34 Labs (1/22): K 4.9, creatinine 1.5 Labs (7/22): LDL 36 Labs (8/22): K 4.8, creatinine 1.26  ECG: NSR, low voltage (personally reviewed)  PMH: 1. Atrial fibrillation: Paroxysmal.   - DCCV to NSR 3/21.  - Atrial fibrillation ablation 9/21 2. BPH with urinary retention - TURP 6/21 3. Type 2 diabetes 4. HTN 5. Nephrolithiasis 6. CKD stage 3 7. Chronic systolic CHF: Nonischemic cardiomyopathy.  - Echo (2/21): EF < 20%, with severely decreased RV  systolic function. - LHC (2/21): No significant CAD - Cardiac MRI (6/21): EF 35%, LGE pattern consistent with lateral infarction.  - Echo (1/22): EF 50-55%, normal RV.  8. PVCs: 8/21 Zio patch with 16% PVCs.  - Zio patch (1/22): 8% PVCs   Current Outpatient Medications  Medication Sig Dispense Refill   apixaban (ELIQUIS) 5 MG TABS tablet Take 1 tablet (5 mg total) by mouth 2 (two) times daily. 60 tablet 11   atorvastatin (LIPITOR) 10 MG tablet Take 10 mg by mouth at bedtime.      carvedilol (COREG) 12.5 MG tablet Take 1 tablet (12.5 mg total) by mouth 2 (two) times daily with a meal. 60 tablet 6   empagliflozin (JARDIANCE) 25 MG TABS tablet Take 25 mg by mouth daily.     metFORMIN (GLUCOPHAGE) 500 MG tablet Take 1 tablet by mouth in the morning and at bedtime.     Multiple Vitamin (MULTIVITAMIN WITH MINERALS) TABS tablet Take 1 tablet by mouth daily.     sacubitril-valsartan (ENTRESTO) 49-51 MG Take 1 tablet by mouth 2 (two) times daily. 60 tablet 11   spironolactone (ALDACTONE) 25 MG tablet Take 12.5 mg by mouth daily.     traZODone (DESYREL) 50 MG tablet Take 50 mg by mouth at bedtime.     No current facility-administered medications for this encounter.    No Known Allergies    Social History   Socioeconomic History   Marital status: Divorced    Spouse name: Not on file   Number of children: Not on file   Years of education: Not on file   Highest education level: Not on file  Occupational History   Not on file  Tobacco Use   Smoking status: Light Smoker    Types: Cigars   Smokeless tobacco: Current    Types: Chew   Tobacco comments:    rarely chews tobacco cigar  Vaping Use   Vaping Use: Never used  Substance and Sexual Activity   Alcohol use: Yes    Alcohol/week: 1.0 standard drink    Types: 1 Cans of beer per week   Drug use: No   Sexual activity: Not Currently  Other Topics Concern   Not on file  Social History Narrative   Not on file   Social Determinants  of Health   Financial Resource Strain: Not on file  Food Insecurity: Not on file  Transportation Needs: Not on file  Physical Activity: Not on file  Stress: Not on file  Social Connections: Not on file  Intimate Partner Violence: Not on file      Family History  Problem Relation Age of Onset   Diabetes Mother    Hypertension Mother    Heart disease Mother    Diabetes Father    Hypertension Father    Heart disease Father    Stroke Father  Colon cancer Neg Hx     Vitals:   03/10/21 0936  BP: 122/70  Pulse: 70  SpO2: 96%  Weight: 92.4 kg (203 lb 9.6 oz)   Wt Readings from Last 3 Encounters:  03/10/21 92.4 kg (203 lb 9.6 oz)  09/17/20 93.9 kg (207 lb)  08/06/20 91.4 kg (201 lb 6.4 oz)   PHYSICAL EXAM: General: NAD Neck: No JVD, no thyromegaly or thyroid nodule.  Lungs: Clear to auscultation bilaterally with normal respiratory effort. CV: Nondisplaced PMI.  Heart regular S1/S2, no S3/S4, no murmur.  No peripheral edema.  No carotid bruit.  Difficult to palpate pedal pulses.  Abdomen: Soft, nontender, no hepatosplenomegaly, no distention.  Skin: Intact without lesions or rashes.  Neurologic: Alert and oriented x 3.  Psych: Normal affect. Extremities: No clubbing or cyanosis.  HEENT: Normal.   ASSESSMENT & PLAN: 1. Chronic systolic CHF: Echo 3/78 with EF <20%, severely decreased RV function. Nonischemic cardiomyopathy, ?tachycardia-mediated due to atrial fibrillation of uncertain duration.  He also has history of frequent PVCs (16% on 8/21 Zio patch).  He required milrinone during 2/21 admission but was titrated off.  Coronary angiography in 2/21 with no significant coronary disease. Cardiac MRI in 6/21 with EF up to 35%, LGE pattern consistent with lateral MI (surprising given no significant CAD on cath).  Echo in 1/22 with EF improved to 50-55%, normal RV.  NYHA class II symptoms, not volume overloaded on exam.  - Continue Coreg 12.5 mg bid.   - He is no longer taking  torsemide.  - Continue spironolactone 12.5 mg daily, BMET today.  - Continue Entresto 49/51 bid.   - Continue empagliflozin with CKD and CHF - Repeat echo at followup in 6 months.  2. Atrial fibrillation: Paroxysmal.  Admitted with afib/RVR of uncertain duration 5/88.  Unsure if atrial fibrillation/RVR was cause of CMP (tachy-mediated) or an effect of the cardiomyopathy.  TEE on 2/22 showed LA appendage thrombus. He was anticoagulated, then underwent DC-CV on 09/08/19 with conversion to NSR. Atrial fibrillation ablation in 9/21.  He is in NSR today.  - He is now off amiodarone.   - Continue apixaban. 3. CKD stage 3: BMET today. 4. BPH/urinary obstruction: Has had TURP.  5. PVCs: Zio patch 01/2020 with 16% PVCs.  He is now off amiodarone.  Repeat Zio patch with 8% PVCs in 1/22.  -  Continue Coreg 12.5 mg bid.  6. PAD: Symptoms in left calf concerning for claudication.  -  I will arrange for peripheral arterial dopplers.   Followup in 6 months with echo  Loralie Champagne, MD 03/10/21

## 2021-03-10 NOTE — Patient Instructions (Addendum)
EKG done today.  Labs done today. We will contact you only if your labs are abnormal.  No medication changes were made. Please continue all current medications as prescribed.  Your physician has requested that you have a lower or upper extremity arterial duplex. This test is an ultrasound of the arteries in the legs or arms. It looks at arterial blood flow in the legs and arms. Allow one hour for Lower and Upper Arterial scans. There are no restrictions or special instructions. This has to be approved through your insurance company prior to scheduling, once approved we will contact you to schedule an appointment.   Your physician recommends that you schedule a follow-up appointment in: 6 months with an echo prior to your exam. Please contact our office in February 2023 to schedule a March 2023 appointment.   If you have any questions or concerns before your next appointment please send [place] a message through [place] or call our office at [phone].    TO LEAVE A MESSAGE FOR THE NURSE SELECT OPTION 2, PLEASE LEAVE A MESSAGE INCLUDING: YOUR NAME DATE OF BIRTH CALL BACK NUMBER REASON FOR CALL**this is important as we prioritize the call backs  YOU WILL RECEIVE A CALL BACK THE SAME DAY AS LONG AS YOU CALL BEFORE 4:00 PM   Do the following things EVERYDAY: Weigh yourself in the morning before breakfast. Write it down and keep it in a log. Take your medicines as prescribed Eat low salt foods--Limit salt (sodium) to 2000 mg per day.  Stay as active as you can everyday Limit all fluids for the day to less than 2 liters   At the [facility], you and your health needs are our priority. As part of our continuing mission to provide you with exceptional heart care, we have created designated Provider Care Teams. These Care Teams include your primary Cardiologist (physician) and Advanced Practice Providers (APPs- Physician Assistants and Nurse Practitioners) who all work together to  provide you with the care you need, when you need it.   You may see any of the following providers on your designated Care Team at your next follow up: Dr Glori Bickers Dr Haynes Kerns, NP Lyda Jester, [place] Audry Riles, PharmD   Please be sure to bring in all your medications bottles to every appointment.

## 2021-03-11 ENCOUNTER — Ambulatory Visit (HOSPITAL_COMMUNITY)
Admission: RE | Admit: 2021-03-11 | Discharge: 2021-03-11 | Disposition: A | Discharge status: Home / Self Care | Payer: Medicare Other | Source: Ambulatory Visit | Attending: Cardiovascular Disease | Admitting: Cardiovascular Disease

## 2021-03-11 DIAGNOSIS — I739 Peripheral vascular disease, unspecified: Secondary | ICD-10-CM

## 2021-03-12 DIAGNOSIS — Z23 Encounter for immunization: Secondary | ICD-10-CM | POA: Diagnosis not present

## 2021-03-14 DIAGNOSIS — E1129 Type 2 diabetes mellitus with other diabetic kidney complication: Secondary | ICD-10-CM | POA: Diagnosis not present

## 2021-03-14 DIAGNOSIS — I10 Essential (primary) hypertension: Secondary | ICD-10-CM | POA: Diagnosis not present

## 2021-03-14 DIAGNOSIS — E785 Hyperlipidemia, unspecified: Secondary | ICD-10-CM | POA: Diagnosis not present

## 2021-05-14 DIAGNOSIS — N1832 Chronic kidney disease, stage 3b: Secondary | ICD-10-CM | POA: Diagnosis not present

## 2021-05-14 DIAGNOSIS — E1129 Type 2 diabetes mellitus with other diabetic kidney complication: Secondary | ICD-10-CM | POA: Diagnosis not present

## 2021-05-14 DIAGNOSIS — Z79899 Other long term (current) drug therapy: Secondary | ICD-10-CM | POA: Diagnosis not present

## 2021-05-14 DIAGNOSIS — I5022 Chronic systolic (congestive) heart failure: Secondary | ICD-10-CM | POA: Diagnosis not present

## 2021-05-21 DIAGNOSIS — I480 Paroxysmal atrial fibrillation: Secondary | ICD-10-CM | POA: Diagnosis not present

## 2021-05-21 DIAGNOSIS — I5022 Chronic systolic (congestive) heart failure: Secondary | ICD-10-CM | POA: Diagnosis not present

## 2021-05-21 DIAGNOSIS — E1122 Type 2 diabetes mellitus with diabetic chronic kidney disease: Secondary | ICD-10-CM | POA: Diagnosis not present

## 2021-05-21 DIAGNOSIS — N1832 Chronic kidney disease, stage 3b: Secondary | ICD-10-CM | POA: Diagnosis not present

## 2021-05-21 DIAGNOSIS — R7309 Other abnormal glucose: Secondary | ICD-10-CM | POA: Diagnosis not present

## 2021-08-12 DIAGNOSIS — Z79899 Other long term (current) drug therapy: Secondary | ICD-10-CM | POA: Diagnosis not present

## 2021-08-12 DIAGNOSIS — I5022 Chronic systolic (congestive) heart failure: Secondary | ICD-10-CM | POA: Diagnosis not present

## 2021-08-12 DIAGNOSIS — N1832 Chronic kidney disease, stage 3b: Secondary | ICD-10-CM | POA: Diagnosis not present

## 2021-08-12 DIAGNOSIS — N182 Chronic kidney disease, stage 2 (mild): Secondary | ICD-10-CM | POA: Diagnosis not present

## 2021-08-12 DIAGNOSIS — E1129 Type 2 diabetes mellitus with other diabetic kidney complication: Secondary | ICD-10-CM | POA: Diagnosis not present

## 2021-08-12 DIAGNOSIS — I480 Paroxysmal atrial fibrillation: Secondary | ICD-10-CM | POA: Diagnosis not present

## 2021-08-20 DIAGNOSIS — I10 Essential (primary) hypertension: Secondary | ICD-10-CM | POA: Diagnosis not present

## 2021-08-20 DIAGNOSIS — E1122 Type 2 diabetes mellitus with diabetic chronic kidney disease: Secondary | ICD-10-CM | POA: Diagnosis not present

## 2021-08-20 DIAGNOSIS — E785 Hyperlipidemia, unspecified: Secondary | ICD-10-CM | POA: Diagnosis not present

## 2021-08-20 DIAGNOSIS — I5022 Chronic systolic (congestive) heart failure: Secondary | ICD-10-CM | POA: Diagnosis not present

## 2021-08-20 DIAGNOSIS — N1832 Chronic kidney disease, stage 3b: Secondary | ICD-10-CM | POA: Diagnosis not present

## 2021-09-09 DIAGNOSIS — Z961 Presence of intraocular lens: Secondary | ICD-10-CM | POA: Diagnosis not present

## 2021-09-09 DIAGNOSIS — H26493 Other secondary cataract, bilateral: Secondary | ICD-10-CM | POA: Diagnosis not present

## 2021-09-10 IMAGING — DX DG CHEST 1V PORT
1 series · 1 of 1 positions shown · non-contrast
Comparison: 05/09/2018

CLINICAL DATA: Short of breath

EXAM:
PORTABLE CHEST 1 VIEW

[chest ap]
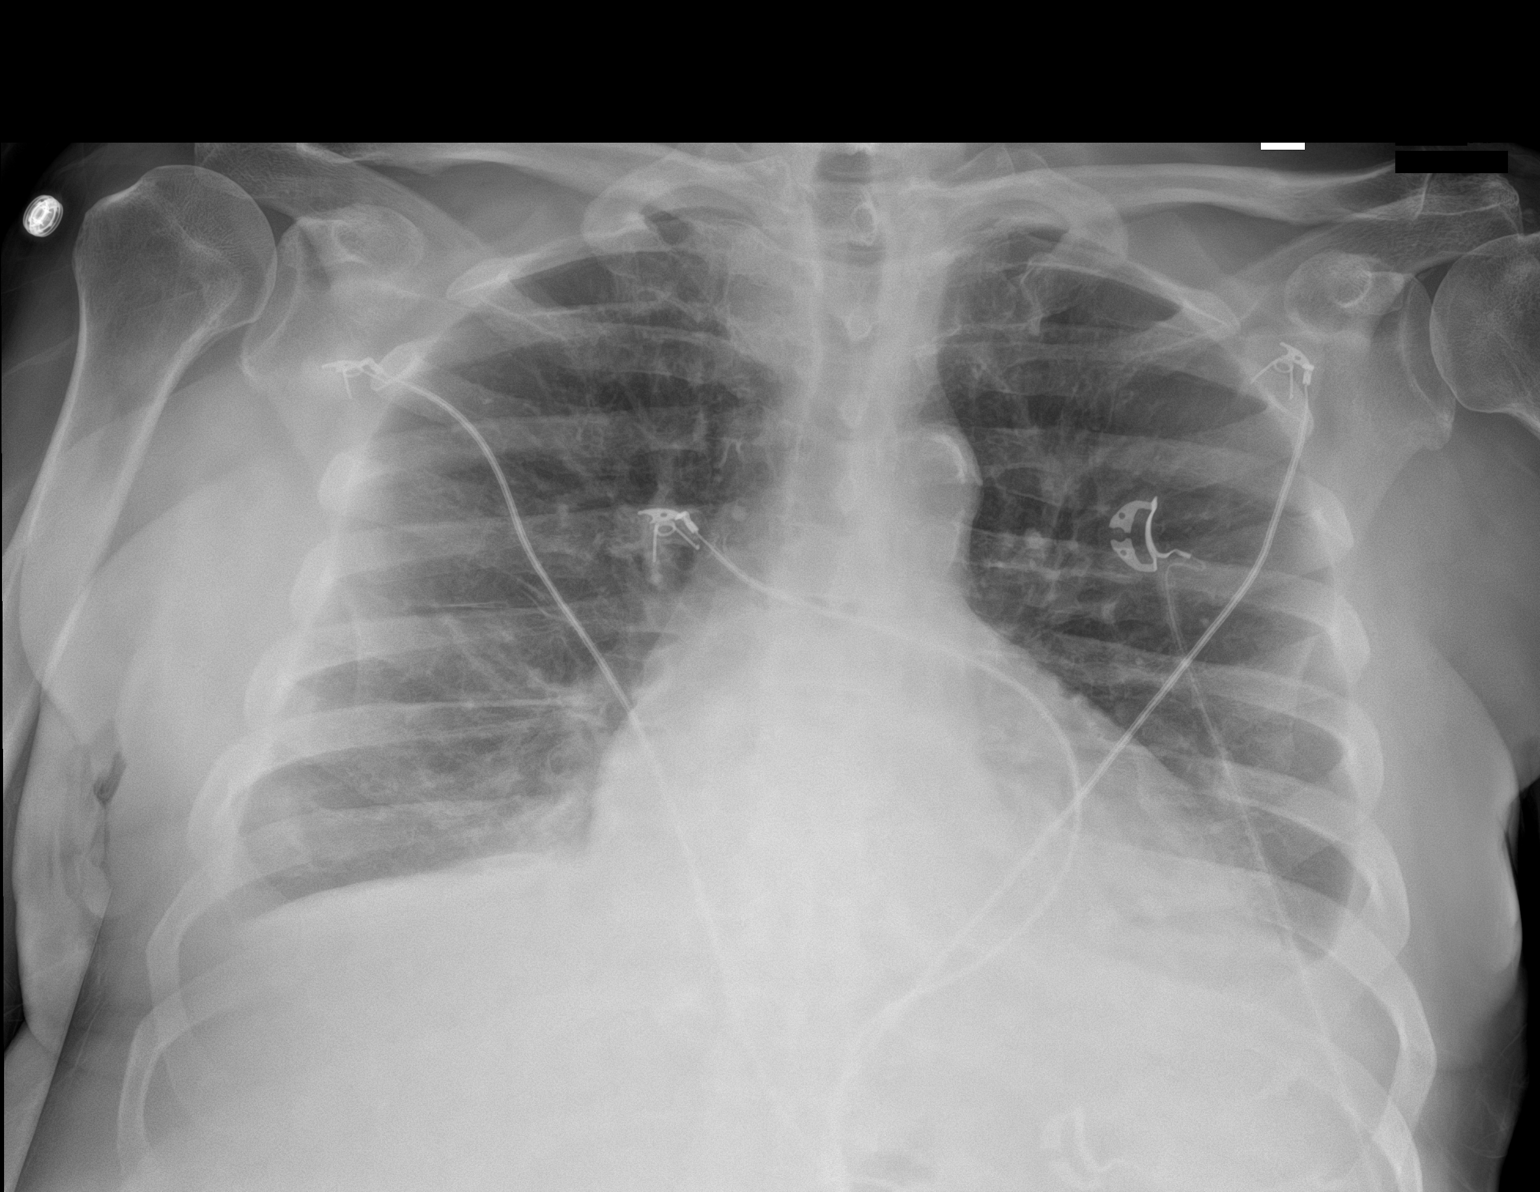

[1 of 1 positions shown; findings below may reference images not displayed]

FINDINGS: Hypoventilation. Decreased lung volume with bibasilar atelectasis
and small pleural effusions.

Cardiac enlargement with mild vascular congestion. Atherosclerotic
aortic arch.
IMPRESSION: Cardiac enlargement with small pleural effusions bilaterally.
Possible mild fluid overload

Hypoventilation with bibasilar atelectasis.

## 2021-10-24 ENCOUNTER — Ambulatory Visit (HOSPITAL_COMMUNITY)
Admission: RE | Admit: 2021-10-24 | Discharge: 2021-10-24 | Disposition: A | Discharge status: Home / Self Care | Payer: Medicare Other | Source: Ambulatory Visit | Attending: Cardiology | Admitting: Cardiology

## 2021-10-24 VITALS — BP 96/50 | HR 66 | Ht 69.0 in | Wt 203.2 lb

## 2021-10-24 DIAGNOSIS — N138 Other obstructive and reflux uropathy: Secondary | ICD-10-CM | POA: Diagnosis not present

## 2021-10-24 DIAGNOSIS — I480 Paroxysmal atrial fibrillation: Secondary | ICD-10-CM | POA: Insufficient documentation

## 2021-10-24 DIAGNOSIS — I493 Ventricular premature depolarization: Secondary | ICD-10-CM | POA: Diagnosis not present

## 2021-10-24 DIAGNOSIS — N183 Chronic kidney disease, stage 3 unspecified: Secondary | ICD-10-CM | POA: Diagnosis not present

## 2021-10-24 DIAGNOSIS — I5022 Chronic systolic (congestive) heart failure: Secondary | ICD-10-CM | POA: Insufficient documentation

## 2021-10-24 DIAGNOSIS — R338 Other retention of urine: Secondary | ICD-10-CM | POA: Diagnosis not present

## 2021-10-24 DIAGNOSIS — Z7901 Long term (current) use of anticoagulants: Secondary | ICD-10-CM | POA: Insufficient documentation

## 2021-10-24 DIAGNOSIS — I428 Other cardiomyopathies: Secondary | ICD-10-CM | POA: Diagnosis not present

## 2021-10-24 DIAGNOSIS — E785 Hyperlipidemia, unspecified: Secondary | ICD-10-CM | POA: Insufficient documentation

## 2021-10-24 DIAGNOSIS — I110 Hypertensive heart disease with heart failure: Secondary | ICD-10-CM | POA: Diagnosis present

## 2021-10-24 DIAGNOSIS — Z79899 Other long term (current) drug therapy: Secondary | ICD-10-CM | POA: Diagnosis not present

## 2021-10-24 DIAGNOSIS — I5042 Chronic combined systolic (congestive) and diastolic (congestive) heart failure: Secondary | ICD-10-CM

## 2021-10-24 DIAGNOSIS — E1122 Type 2 diabetes mellitus with diabetic chronic kidney disease: Secondary | ICD-10-CM | POA: Diagnosis not present

## 2021-10-24 DIAGNOSIS — I130 Hypertensive heart and chronic kidney disease with heart failure and stage 1 through stage 4 chronic kidney disease, or unspecified chronic kidney disease: Secondary | ICD-10-CM | POA: Insufficient documentation

## 2021-10-24 NOTE — Patient Instructions (Signed)
EKG done today. ? ?No Labs done today.  ? ?No medication changes were made. Please continue all current medications as prescribed. ? ?Your physician recommends that you schedule a follow-up appointment soon for an echo, in 3 months for a lab appointment only and in 6 months with our NP/PA Clinic here in our office. Please contact our office in August to schedule a November appointment.  ? ?Your physician has requested that you have an echocardiogram. Echocardiography is a painless test that uses sound waves to create images of your heart. It provides your doctor with information about the size and shape of your heart and how well your heart?s chambers and valves are working. This procedure takes approximately one hour. There are no restrictions for this procedure. ? ?If you have any questions or concerns before your next appointment please send [place] a message through [place] or call our office at [phone].   ? ?TO LEAVE A MESSAGE FOR THE NURSE SELECT OPTION 2, PLEASE LEAVE A MESSAGE INCLUDING: ?YOUR NAME ?DATE OF BIRTH ?CALL BACK NUMBER ?REASON FOR CALL**this is important as we prioritize the call backs ? ?YOU WILL RECEIVE A CALL BACK THE SAME DAY AS LONG AS YOU CALL BEFORE 4:00 PM ? ? ?Do the following things EVERYDAY: ?Weigh yourself in the morning before breakfast. Write it down and keep it in a log. ?Take your medicines as prescribed ?Eat low salt foods--Limit salt (sodium) to 2000 mg per day.  ?Stay as active as you can everyday ?Limit all fluids for the day to less than 2 liters ? ? ?At the [facility], you and your health needs are our priority. As part of our continuing mission to provide you with exceptional heart care, we have created designated Provider Care Teams. These Care Teams include your primary Cardiologist (physician) and Advanced Practice Providers (APPs- Physician Assistants and Nurse Practitioners) who all work together to provide you with the care you need, when you need  it.  ? ?You may see any of the following providers on your designated Care Team at your next follow up: ?Dr Glori Bickers ?Dr Loralie Champagne ?Darrick Grinder, NP ?Lyda Jester, PA ?Audry Riles, PharmD ? ? ?Please be sure to bring in all your medications bottles to every appointment.  ? ?

## 2021-10-26 NOTE — Progress Notes (Signed)
t  ?Advanced Heart Failure Clinic Note  ? ? ?PCP: [name], MD ?PCP-Cardiologist: Kate Sable, MD (Inactive)  ?AHF: Dr. Aundra Dubin  ?Urology: Dr Karsten Ro  ? ?HPI: ?81 y.o. male w/  prior history of HTN and hyperlipidemia, who initially reported to [facility]  w/ complaints of about 1 month of cough and dyspnea with exertion as well as progressive edema. No chest pain. He saw his PCP prior to coming to the ED on 08/01/19 and was noted to be in atrial fibrillation with RVR and he was then sent to [facility] for admission. Hs-TnI was flat at 25 => 27.  BNP elevated, 1,068. COVID negative. He was initially tried on diltiazem gtt but became hypotensive and changed to amiodarone gtt.  Echo was done, showing EF < 20% with diffuse hypokinesis, severely decreased RV function. He was transferred to [facility] on 2/18 for HF work up and cath.  ?  ?On arrival to [facility], he was found to be volume overloaded w/ soft SBPs and rising SCr up to 1.9 with attempts at diuresis, concerning for low output. PICC line was placed to measure co-ox and CVP. Initial co-ox low at 46%. He was started on milrinone and IV Lasix. IV amiodarone continued for afib + IV heparin. RHC/LHC 08/04/19 with no coronary disease, mildly elevated filling pressures. Co-ox improved w/ milrinone. He was ultimately weaned off milrinone and co-ox remained stable. He diuresed well w/ IV Lasix and later changed to torsemide. SCr improved w/ diuresis, down to 1.32.  ?  ?He was set up for TEE/DCCV on 08/07/19 and found to have LAA thrombus on TEE. DCCV canceled. IV amiodarone was discontinued and he was changed to PO, 200 mg bid. Placed on Eliquis 5 bid. ASA discontinued. Plan to repeat TEE/DCCV after 1 month of chronic anticoagulation. He was advised to continue amiodarone 200 mg bid x 7 days, followed by 200 mg daily.  ?  ?Also of note, he develop urinary retention due to BPH. Had over 500cc on bladder scan. Multiple attempts were made by the nursing staff to place a foley which were  unsuccessful. Urology was consulted and placed 16 french council foley catheter. Subsequently, he had mild hematuria which ultimately cleared. Urology recommended foley to continue on discharge with plans to be followed closely in outpatient clinic. He was discharged from the hospital 08/09/19.  ? ?He had f/u w/ urology 08/15/19 and foley removed. After returning home, he redeveloped urinary retention and went to ED next day. Foley cath reinserted.  ? ?S/P DC-CV 09/08/19 with restoration of NSR.  ? ?He had TURP in 6/21.  ? ?Cardiac MRI in 6/21 showed EF 35%, LGE pattern consistent with lateral infarction.  ? ?He saw Dr. Curt Bears and atrial fibrillation ablation was recommended. S/P A fib ablation 02/2020. Zio patch was placed to assess PVC burden. Had 16% PVC burden.   ? ?Echo in 1/22 showed EF up to 50-55% with normal RV.  Zio patch was repeated in 1/22, now 8% PVCs.  ? ?Patient returns for followup of CHF.  He is in NSR today. Main complaint is hip pain.  No exertional dyspnea. No chest pain.  Mild lightheadedness if he stands too fast.  Weight is stable.  ? ?Labs (3/21): K 4.7, creatinine 1.6, TSH elevated, free T3/free T4 normal, LFTs normal.  ?Labs (5/21): LFTs normal, digoxin 0.8, TSH mildly elevated, free T3 and free T4 normal.  ?Labs (6/21): K 4.9 => 5.3, creatinine 1.77 => 1.65 ?Labs (02/01/20): K 5.1 Creatinine 1.5  ?  Labs (10/21): K 4.6, creatinine 1.34 ?Labs (1/22): K 4.9, creatinine 1.5 ?Labs (7/22): LDL 36 ?Labs (8/22): K 4.8, creatinine 1.26 ?Labs (9/22): K 4.3, creatinine 1.34 ?Labs (4/23): LDL 31, TGs 174, K 4.3, creatinine 1.2, hgb 15.1 ? ?ECG: NSR, PACs (personally reviewed) ? ?PMH: ?1. Atrial fibrillation: Paroxysmal.   ?- DCCV to NSR 3/21.  ?- Atrial fibrillation ablation 9/21 ?2. BPH with urinary retention ?- TURP 6/21 ?3. Type 2 diabetes ?4. HTN ?5. Nephrolithiasis ?6. CKD stage 3 ?7. Chronic systolic CHF: Nonischemic cardiomyopathy.  ?- Echo (2/21): EF < 20%, with severely decreased RV systolic  function. ?- LHC (2/21): No significant CAD ?- Cardiac MRI (6/21): EF 35%, LGE pattern consistent with lateral infarction.  ?- Echo (1/22): EF 50-55%, normal RV.  ?8. PVCs: 8/21 Zio patch with 16% PVCs.  ?- Zio patch (1/22): 8% PVCs ?9. ABIs (9/22): Normal ? ? ?Current Outpatient Medications  ?Medication Sig Dispense Refill  ? apixaban (ELIQUIS) 5 MG TABS tablet Take 1 tablet (5 mg total) by mouth 2 (two) times daily. 60 tablet 11  ? atorvastatin (LIPITOR) 10 MG tablet Take 10 mg by mouth at bedtime.     ? carvedilol (COREG) 12.5 MG tablet TAKE 1 TABLET BY MOUTH TWICE DAILY WITH MEALS. 60 tablet 11  ? empagliflozin (JARDIANCE) 25 MG TABS tablet Take 25 mg by mouth daily.    ? metFORMIN (GLUCOPHAGE) 500 MG tablet Take 1 tablet by mouth in the morning and at bedtime.    ? Multiple Vitamin (MULTIVITAMIN WITH MINERALS) TABS tablet Take 1 tablet by mouth daily.    ? sacubitril-valsartan (ENTRESTO) 49-51 MG Take 1 tablet by mouth 2 (two) times daily. 60 tablet 11  ? spironolactone (ALDACTONE) 25 MG tablet Take 12.5 mg by mouth daily.    ? traZODone (DESYREL) 50 MG tablet Take 50 mg by mouth at bedtime.    ? ?No current facility-administered medications for this encounter.  ? ? ?No Known Allergies ? ?  ?Social History  ? ?Socioeconomic History  ? Marital status: Divorced  ?  Spouse name: Not on file  ? Number of children: Not on file  ? Years of education: Not on file  ? Highest education level: Not on file  ?Occupational History  ? Not on file  ?Tobacco Use  ? Smoking status: Light Smoker  ?  Types: Cigars  ? Smokeless tobacco: Current  ?  Types: Chew  ? Tobacco comments:  ?  rarely chews tobacco cigar  ?Vaping Use  ? Vaping Use: Never used  ?Substance and Sexual Activity  ? Alcohol use: Yes  ?  Alcohol/week: 1.0 standard drink  ?  Types: 1 Cans of beer per week  ? Drug use: No  ? Sexual activity: Not Currently  ?Other Topics Concern  ? Not on file  ?Social History Narrative  ? Not on file  ? ?Social Determinants of  Health  ? ?Financial Resource Strain: Not on file  ?Food Insecurity: Not on file  ?Transportation Needs: Not on file  ?Physical Activity: Not on file  ?Stress: Not on file  ?Social Connections: Not on file  ?Intimate Partner Violence: Not on file  ? ? ?  ?Family History  ?Problem Relation Age of Onset  ? Diabetes Mother   ? Hypertension Mother   ? Heart disease Mother   ? Diabetes Father   ? Hypertension Father   ? Heart disease Father   ? Stroke Father   ? Colon cancer Neg Hx   ? ? ?  Vitals:  ? 10/24/21 0931  ?BP: (!) 96/50  ?Pulse: 66  ?SpO2: 95%  ?Weight: 92.2 kg (203 lb 3.2 oz)  ?Height: '5\' 9"'$  (1.753 m)  ? ?Wt Readings from Last 3 Encounters:  ?10/24/21 92.2 kg (203 lb 3.2 oz)  ?03/10/21 92.4 kg (203 lb 9.6 oz)  ?09/17/20 93.9 kg (207 lb)  ? ?PHYSICAL EXAM: ?General: NAD ?Neck: No JVD, no thyromegaly or thyroid nodule.  ?Lungs: Clear to auscultation bilaterally with normal respiratory effort. ?CV: Nondisplaced PMI.  Heart regular S1/S2, no S3/S4, no murmur.  No peripheral edema.  No carotid bruit.  Normal pedal pulses.  ?Abdomen: Soft, nontender, no hepatosplenomegaly, no distention.  ?Skin: Intact without lesions or rashes.  ?Neurologic: Alert and oriented x 3.  ?Psych: Normal affect. ?Extremities: No clubbing or cyanosis.  ?HEENT: Normal.  ? ?ASSESSMENT & PLAN: ?1. Chronic systolic CHF: Echo 9/02 with EF <20%, severely decreased RV function. Nonischemic cardiomyopathy, ?tachycardia-mediated due to atrial fibrillation of uncertain duration.  He also has history of frequent PVCs (16% on 8/21 Zio patch).  He required milrinone during 2/21 admission but was titrated off.  Coronary angiography in 2/21 with no significant coronary disease. Cardiac MRI in 6/21 with EF up to 35%, LGE pattern consistent with lateral MI (surprising given no significant CAD on cath).  Echo in 1/22 with EF improved to 50-55%, normal RV.  NYHA class II symptoms, not volume overloaded on exam.  ?- Continue Coreg 12.5 mg bid.   ?- He is no  longer taking torsemide.  ?- Continue spironolactone 12.5 mg daily, BMET today.  ?- Continue Entresto 49/51 bid.   ?- Continue empagliflozin with CKD and CHF ?- I will arrange for repeat echo.  ?2. Atrial fibrill

## 2021-11-06 ENCOUNTER — Ambulatory Visit (HOSPITAL_COMMUNITY)
Admission: RE | Admit: 2021-11-06 | Discharge: 2021-11-06 | Disposition: A | Discharge status: Home / Self Care | Payer: Medicare Other | Source: Ambulatory Visit | Attending: Internal Medicine | Admitting: Internal Medicine

## 2021-11-06 DIAGNOSIS — I4891 Unspecified atrial fibrillation: Secondary | ICD-10-CM | POA: Insufficient documentation

## 2021-11-06 DIAGNOSIS — E119 Type 2 diabetes mellitus without complications: Secondary | ICD-10-CM | POA: Insufficient documentation

## 2021-11-06 DIAGNOSIS — I5042 Chronic combined systolic (congestive) and diastolic (congestive) heart failure: Secondary | ICD-10-CM

## 2021-11-06 DIAGNOSIS — I509 Heart failure, unspecified: Secondary | ICD-10-CM | POA: Insufficient documentation

## 2021-11-06 DIAGNOSIS — E785 Hyperlipidemia, unspecified: Secondary | ICD-10-CM | POA: Insufficient documentation

## 2021-11-06 DIAGNOSIS — I110 Hypertensive heart disease with heart failure: Secondary | ICD-10-CM | POA: Diagnosis not present

## 2021-11-06 NOTE — Progress Notes (Signed)
  Echocardiogram 2D Echocardiogram has been performed.  Merrie Roof F 11/06/2021, 8:49 AM

## 2021-11-07 ENCOUNTER — Telehealth (HOSPITAL_COMMUNITY): Payer: Self-pay | Admitting: Surgery

## 2021-11-07 LAB — ECHOCARDIOGRAM COMPLETE
AR max vel: 1.98 cm2
AV Area VTI: 1.86 cm2
AV Area mean vel: 1.86 cm2
AV Mean grad: 2 mmHg
AV Peak grad: 4 mmHg
Ao pk vel: 1 m/s
Area-P 1/2: 1.85 cm2
S' Lateral: 3.7 cm

## 2021-11-07 NOTE — Telephone Encounter (Signed)
I called patient and made him aware of results.

## 2021-11-07 NOTE — Telephone Encounter (Signed)
-----   Message from Larey Dresser, MD sent at 11/07/2021 12:23 PM EDT ----- EF 50-55%, stable.

## 2021-11-12 DIAGNOSIS — E785 Hyperlipidemia, unspecified: Secondary | ICD-10-CM | POA: Diagnosis not present

## 2021-11-12 DIAGNOSIS — I10 Essential (primary) hypertension: Secondary | ICD-10-CM | POA: Diagnosis not present

## 2021-11-12 DIAGNOSIS — E1129 Type 2 diabetes mellitus with other diabetic kidney complication: Secondary | ICD-10-CM | POA: Diagnosis not present

## 2021-12-05 ENCOUNTER — Other Ambulatory Visit (HOSPITAL_COMMUNITY): Payer: Self-pay | Admitting: Cardiology

## 2021-12-05 DIAGNOSIS — I5022 Chronic systolic (congestive) heart failure: Secondary | ICD-10-CM | POA: Diagnosis not present

## 2021-12-05 DIAGNOSIS — E1129 Type 2 diabetes mellitus with other diabetic kidney complication: Secondary | ICD-10-CM | POA: Diagnosis not present

## 2021-12-05 DIAGNOSIS — N1832 Chronic kidney disease, stage 3b: Secondary | ICD-10-CM | POA: Diagnosis not present

## 2021-12-05 DIAGNOSIS — Z79899 Other long term (current) drug therapy: Secondary | ICD-10-CM | POA: Diagnosis not present

## 2021-12-12 DIAGNOSIS — E1129 Type 2 diabetes mellitus with other diabetic kidney complication: Secondary | ICD-10-CM | POA: Diagnosis not present

## 2021-12-12 DIAGNOSIS — R739 Hyperglycemia, unspecified: Secondary | ICD-10-CM | POA: Diagnosis not present

## 2021-12-12 DIAGNOSIS — N183 Chronic kidney disease, stage 3 unspecified: Secondary | ICD-10-CM | POA: Diagnosis not present

## 2021-12-12 DIAGNOSIS — I5020 Unspecified systolic (congestive) heart failure: Secondary | ICD-10-CM | POA: Diagnosis not present

## 2022-01-22 IMAGING — MR MR CARD MORPHOLOGY WO/W CM
45 of 48 series · 45 of 48 positions shown · IV contrast (gadavist)
Comparison: TTE 08/02/19, TARKU 09/08/19, 08/07/19

EXAM:
CARDIAC MRI

CLINICAL DATA: NICM
TECHNIQUE: The patient was scanned on a 1.5 Tesla GE magnet. A dedicated
cardiac coil was used. Functional imaging was done using [employer]iest[employer]
sequences. [DATE], and 4 chamber views were done to assess for RWMA's.
Modified Memdu rule using a short axis stack was used to
calculate an ejection fraction on a dedicated work station using
Circle software. The patient received 10mL GADAVIST GADOBUTROL 1
MMOL/ML IV SOLN. After 10 minutes inversion recovery sequences were
used to assess for infiltration and scar tissue.

[Series 4: t2_haste_db_tra_bh · axial · 8.0mm · 1.33mm/px · 1 of 16 slices shown]
[im 1/16]
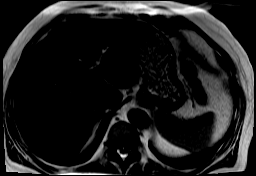

[Series 8: bSSFP · oblique · 8.0mm · 1.61mm/px · 1 of 25 slices shown (1 of 26)]
[im 1/25]
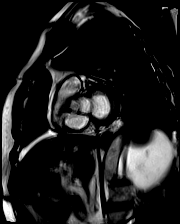

[Series 9: bSSFP · oblique · 8.0mm · 1.61mm/px · 1 of 25 slices shown (2 of 26)]
[im 1/25]
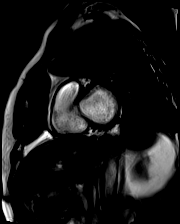

[Series 10: bSSFP · oblique · 8.0mm · 1.61mm/px · 1 of 25 slices shown (3 of 26)]
[im 1/25]
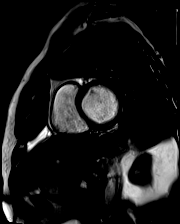

[Series 11: bSSFP · oblique · 8.0mm · 1.61mm/px · 1 of 32 slices shown (4 of 26)]
[im 1/32]
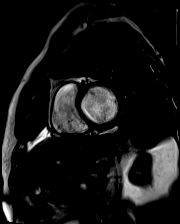

[Series 12: bSSFP · oblique · 8.0mm · 1.61mm/px · 1 of 25 slices shown (5 of 26)]
[im 1/25]
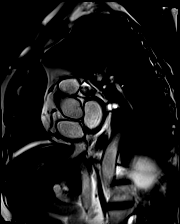

[Series 13: bSSFP · oblique · 8.0mm · 1.61mm/px · 1 of 25 slices shown (6 of 26)]
[im 1/25]
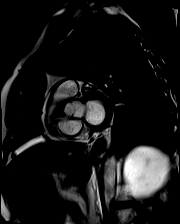

[Series 14: bSSFP · oblique · 8.0mm · 1.61mm/px · 1 of 25 slices shown (7 of 26)]
[im 1/25]
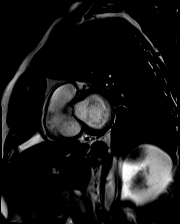

[Series 15: bSSFP · oblique · 8.0mm · 1.61mm/px · 1 of 25 slices shown (8 of 26)]
[im 1/25]
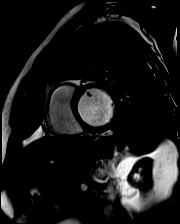

[Series 16: bSSFP · oblique · 8.0mm · 1.61mm/px · 1 of 25 slices shown (9 of 26)]
[im 1/25]
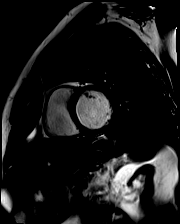

[Series 17: bSSFP · oblique · 8.0mm · 1.61mm/px · 1 of 25 slices shown (10 of 26)]
[im 1/25]
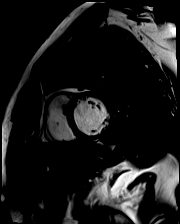

[Series 18: bSSFP · oblique · 8.0mm · 1.61mm/px · 1 of 25 slices shown (11 of 26)]
[im 1/25]
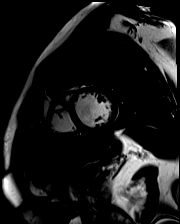

[Series 19: bSSFP · oblique · 8.0mm · 1.61mm/px · 1 of 25 slices shown (12 of 26)]
[im 1/25]
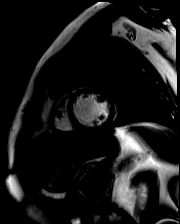

[Series 20: bSSFP · oblique · 8.0mm · 1.61mm/px · 1 of 25 slices shown (13 of 26)]
[im 1/25]
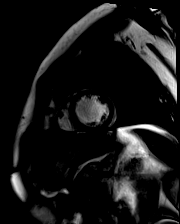

[Series 21: bSSFP · oblique · 8.0mm · 1.61mm/px · 1 of 25 slices shown (14 of 26)]
[im 1/25]
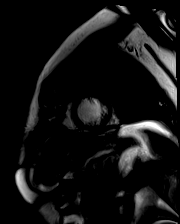

[Series 22: bSSFP · oblique · 8.0mm · 1.61mm/px · 1 of 25 slices shown (15 of 26)]
[im 1/25]
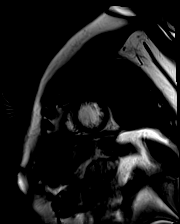

[Series 23: bSSFP · oblique · 8.0mm · 1.61mm/px · 1 of 25 slices shown (16 of 26)]
[im 1/25]
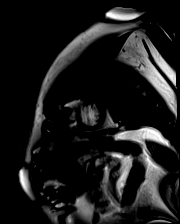

[Series 24: bSSFP · oblique · 8.0mm · 1.61mm/px · 1 of 25 slices shown (17 of 26)]
[im 1/25]
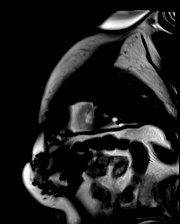

[Series 25: bSSFP · oblique · 8.0mm · 1.61mm/px · 1 of 25 slices shown (18 of 26)]
[im 1/25]
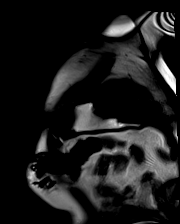

[Series 26: bSSFP · oblique · 8.0mm · 1.61mm/px · 1 of 25 slices shown (19 of 26)]
[im 1/25]
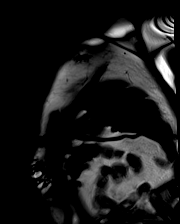

[Series 27: bSSFP · oblique · 8.0mm · 1.61mm/px · 1 of 25 slices shown (20 of 26)]
[im 1/25]
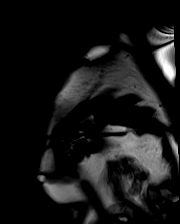

[Series 28: bSSFP · oblique · 8.0mm · 1.61mm/px · 1 of 25 slices shown (21 of 26)]
[im 1/25]
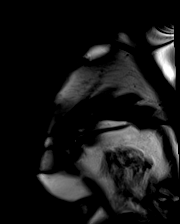

[Series 29: (id)_long_t1 · oblique · 8.0mm · 1.41mm/px · 1 of 24 slices shown]
[im 1/24]
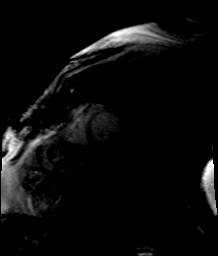

[Series 30: (id)_long_t1_moco · oblique · 8.0mm · 1.41mm/px · 1 of 24 slices shown]
[im 1/24]
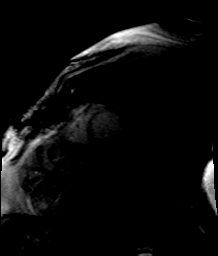

[Series 33: (id)_trufi · oblique · 8.0mm · 1.88mm/px · 1 of 9 slices shown]
[im 1/9]
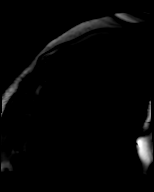

[Series 34: (id)_trufi_moco · oblique · 8.0mm · 1.88mm/px · 1 of 9 slices shown]
[im 1/9]
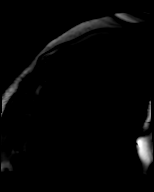

[Series 37: bSSFP · axial · 6.0mm · 1.41mm/px · 1 of 25 slices shown (22 of 26)]
[im 1/25]
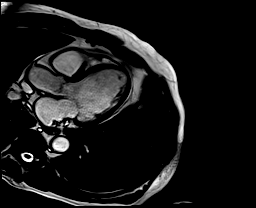

[Series 38: bSSFP · oblique · 6.0mm · 1.41mm/px · 1 of 25 slices shown (23 of 26)]
[im 1/25]
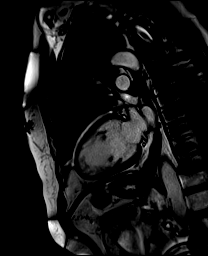

[Series 39: bSSFP · axial · 6.0mm · 1.41mm/px · 1 of 25 slices shown (24 of 26)]
[im 1/25]
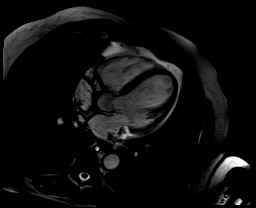

[Series 40: STIR · oblique · 8.0mm · 1.73mm/px · 1 of 15 slices shown]
[im 1/15]
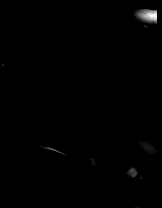

[Series 41: bSSFP · axial · 6.0mm · 1.41mm/px · 1 of 25 slices shown (25 of 26)]
[im 1/25]
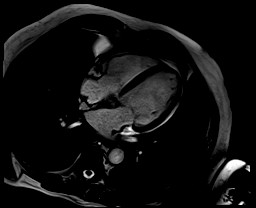

[Series 42: bSSFP · coronal · 6.0mm · 1.41mm/px · 1 of 25 slices shown (26 of 26)]
[im 1/25]
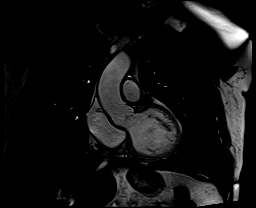

[Series 43: aortic valve cine · sagittal · 6.0mm · 1.41mm/px · 1 of 25 slices shown]
[im 1/25]
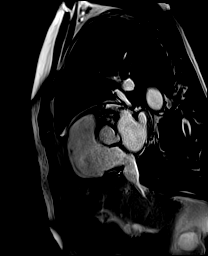

[Series 44: cine rvit · oblique · 6.0mm · 1.41mm/px · 1 of 25 slices shown]
[im 1/25]
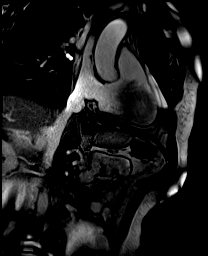

[Series 45: cine rvot · sagittal · 6.0mm · 1.41mm/px · 1 of 25 slices shown]
[im 1/25]
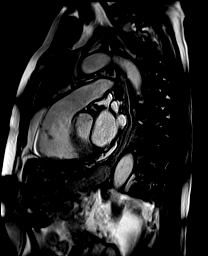

[Series 47: lge_single shot sa · oblique · 8.0mm · 1.98mm/px · 1 of 10 slices shown (1 of 3)]
[im 1/10]
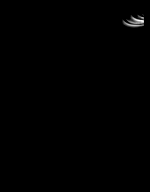

[Series 48: lge_single shot sa · oblique · 8.0mm · 1.98mm/px · 1 of 10 slices shown (2 of 3)]
[im 1/10]
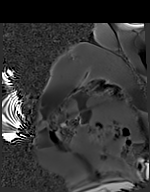

[Series 51: lge_single shot 4 · axial · 6.0mm · 1.98mm/px · 1 of 1 slices shown (1 of 2)]
[im 1/1]
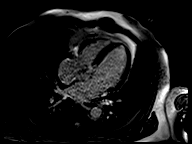

[Series 52: lge_single shot 4 · axial · 6.0mm · 1.98mm/px · 1 of 1 slices shown (2 of 2)]
[im 1/1]
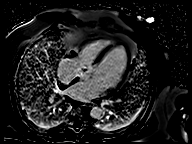

[Series 53: lge_single shot 3 · axial · 6.0mm · 1.98mm/px · 1 of 1 slices shown (1 of 2)]
[im 1/1]
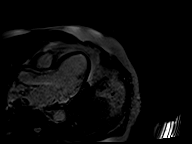

[Series 54: lge_single shot 3 · axial · 6.0mm · 1.98mm/px · 1 of 1 slices shown (2 of 2)]
[im 1/1]
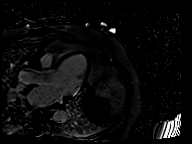

[Series 55: (id)_short_t1 · oblique · 8.0mm · 1.41mm/px · 1 of 27 slices shown]
[im 1/27]
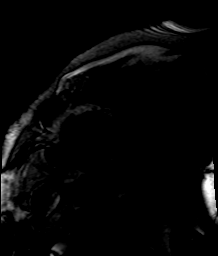

[Series 56: (id)_short_t1_moco · oblique · 8.0mm · 1.41mm/px · 1 of 27 slices shown]
[im 1/27]
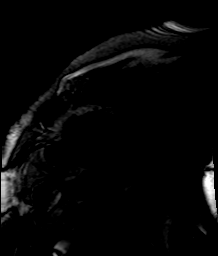

[Series 57: (id)_short_t1_moco_t1 · oblique · 8.0mm · 1.41mm/px · 1 of 6 slices shown]
[im 1/6]
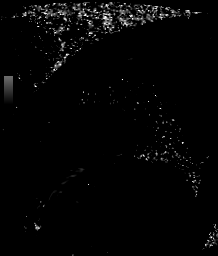

[Series 59: lge_single shot sa · oblique · 8.0mm · 1.98mm/px · 1 of 14 slices shown (3 of 3)]
[im 1/14]
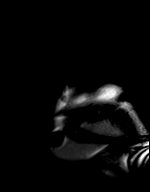

[45 of 48 positions shown; findings below may reference images not displayed]

FINDINGS: LEFT VENTRICLE:

Mildly dilated left ventricular chamber size, LV EDD 59 mm.

Left ventricular wall thinning in inferolateral wall at base and
mid-ventricle. Otherwise normal LV wall thickness.

Moderate reduced left ventricular systolic function.

Unable to calculate left ventricular ejection fraction. Visually
estimated LVEF = 35% with variability based on arrhythmia.

Global hypokinesis with akinesis of the inferolateral wall.

Post contrast delayed myocardial enhancement is seen subendocardial
and mid myocardial in the inferolateral wall from base to
mid-ventricle.

Normal T1 myocardial nulling kinetics suggest against a diagnosis of
cardiac amyloidosis.

ECV = [DATE] be consistent with dilated cardiomyopathy.

RIGHT VENTRICLE:

Mildly dilated right ventricular chamber size by visual estimate.

Normal right ventricular wall thickness.

Normal right ventricular systolic function.

RVEF is visually normal.

There are no regional wall motion abnormalities.

No post contrast delayed myocardial enhancement. No evidence of
arrhythmogenic cardiomyopathy.

ATRIA:

Moderate biatrial chamber enlargement.

Left atrial appendage incompletely visualized, however no definite
evidenced of thrombus.

Atrial septum not well visualized to evaluate for atrial level
shunt.

VALVES:

No significant valvular abnormalities.

Tricuspid aortic valve.

PERICARDIUM:

Normal pericardium.  No pericardial effusion.

AORTA: 33 mm mid ascending aorta measured in axial plane.

OTHER: No significant extracardiac findings.

MEASUREMENTS:
Unable to accurately calculate volumes and ejection fraction due to
arrhythmia artifact.
IMPRESSION: 1. Moderately reduced left ventricular systolic function by visual
estimate, visual LVEF is approximately 35% with variability due to
arrhythmia. Akinesis of the inferolateral wall from base-mid
ventricle.

2. Post contrast delayed myocardial enhancement is seen
subendocardial and mid myocardial in the left ventricular
inferolateral wall from base to mid-ventricle. This finding is most
consistent with infarction. Degree of prior infarction is out of
proportion to cardiomyopathy, findings suggest primarily nonischemic
cardiomyopathic process.

3. Normal T1 myocardial nulling, no evidence of cardiac amyloidosis.

4.  ECV = [DATE] be consistent with dilated cardiomyopathy.

5. Mildly dilated right ventricle with normal systolic function and
no post-contrast delayed myocardial enhancement.

## 2022-01-27 ENCOUNTER — Other Ambulatory Visit: Payer: Self-pay | Admitting: Cardiology

## 2022-01-27 DIAGNOSIS — I5042 Chronic combined systolic (congestive) and diastolic (congestive) heart failure: Secondary | ICD-10-CM | POA: Diagnosis not present

## 2022-01-28 LAB — BASIC METABOLIC PANEL
BUN/Creatinine Ratio: 21 (ref 10–24)
BUN: 22 mg/dL (ref 8–27)
CO2: 19 mmol/L — ABNORMAL LOW (ref 20–29)
Calcium: 9.8 mg/dL (ref 8.6–10.2)
Chloride: 100 mmol/L (ref 96–106)
Creatinine, Ser: 1.06 mg/dL (ref 0.76–1.27)
Glucose: 124 mg/dL — ABNORMAL HIGH (ref 70–99)
Potassium: 4.8 mmol/L (ref 3.5–5.2)
Sodium: 136 mmol/L (ref 134–144)
eGFR: 70 mL/min/{1.73_m2} (ref >59)

## 2022-02-10 ENCOUNTER — Other Ambulatory Visit (HOSPITAL_COMMUNITY): Payer: Self-pay | Admitting: Cardiology

## 2022-02-19 ENCOUNTER — Other Ambulatory Visit (HOSPITAL_COMMUNITY): Payer: Self-pay | Admitting: Cardiology

## 2022-03-03 DIAGNOSIS — N1832 Chronic kidney disease, stage 3b: Secondary | ICD-10-CM | POA: Diagnosis not present

## 2022-03-03 DIAGNOSIS — E1129 Type 2 diabetes mellitus with other diabetic kidney complication: Secondary | ICD-10-CM | POA: Diagnosis not present

## 2022-03-03 DIAGNOSIS — Z79899 Other long term (current) drug therapy: Secondary | ICD-10-CM | POA: Diagnosis not present

## 2022-03-10 DIAGNOSIS — Z23 Encounter for immunization: Secondary | ICD-10-CM | POA: Diagnosis not present

## 2022-03-10 DIAGNOSIS — I5022 Chronic systolic (congestive) heart failure: Secondary | ICD-10-CM | POA: Diagnosis not present

## 2022-03-10 DIAGNOSIS — R3121 Asymptomatic microscopic hematuria: Secondary | ICD-10-CM | POA: Diagnosis not present

## 2022-03-10 DIAGNOSIS — R7309 Other abnormal glucose: Secondary | ICD-10-CM | POA: Diagnosis not present

## 2022-03-10 DIAGNOSIS — E875 Hyperkalemia: Secondary | ICD-10-CM | POA: Diagnosis not present

## 2022-03-10 DIAGNOSIS — E1122 Type 2 diabetes mellitus with diabetic chronic kidney disease: Secondary | ICD-10-CM | POA: Diagnosis not present

## 2022-03-13 ENCOUNTER — Other Ambulatory Visit (HOSPITAL_COMMUNITY): Payer: Self-pay | Admitting: Cardiology

## 2022-03-19 ENCOUNTER — Encounter: Payer: Self-pay | Admitting: Urology

## 2022-03-19 ENCOUNTER — Ambulatory Visit: Payer: Medicare Other | Admitting: Urology

## 2022-03-19 VITALS — BP 117/71 | HR 75 | Ht 69.0 in | Wt 195.0 lb

## 2022-03-19 DIAGNOSIS — R8290 Unspecified abnormal findings in urine: Secondary | ICD-10-CM | POA: Diagnosis not present

## 2022-03-19 DIAGNOSIS — R3129 Other microscopic hematuria: Secondary | ICD-10-CM

## 2022-03-19 LAB — MICROSCOPIC EXAMINATION

## 2022-03-19 LAB — URINALYSIS, ROUTINE W REFLEX MICROSCOPIC
Bilirubin, UA: NEGATIVE
Ketones, UA: NEGATIVE
Leukocytes,UA: NEGATIVE
Nitrite, UA: POSITIVE — AB
Protein,UA: NEGATIVE
Specific Gravity, UA: 1.015 (ref 1.005–1.030)
Urobilinogen, Ur: 0.2 mg/dL (ref 0.2–1.0)
pH, UA: 5.5 (ref 5.0–7.5)

## 2022-03-19 NOTE — Progress Notes (Signed)
Assessment: 1. Microscopic hematuria   2. Abnormal urine findings      Plan: I reviewed the patient's records including lab results from the [place] and office notes from Dr. Willey Blade. Urine culture sent today to evaluate for possible UTI. We will contact him with results and recommendations for treatment. No evidence of microscopic hematuria today. Recommend follow-up in 1 month.  Chief Complaint:  Chief Complaint  Patient presents with   Hematuria    History of Present Illness:  Chris Guerrero is a 81 y.o. male who is seen in consultation from [name], MD for evaluation of microscopic hematuria. Urinalysis from 02/11/2022 showed 6 RBCs, 6 WBCs. He is status post TURP by Dr. Karsten Ro in June 2021.  Cystoscopy at that time demonstrated no bladder lesions. He does not have any significant lower urinary tract symptoms other than nocturia x1.  No dysuria or gross hematuria.  He has a remote history of kidney stones >10 years ago.  No recent stone symptoms.  No history of UTIs.  Past Medical History:  Past Medical History:  Diagnosis Date   Atrial fibrillation ([place])    BPH with obstruction/lower urinary tract symptoms    CHF (congestive heart failure) (HCC)    CKD (chronic kidney disease), stage III (HCC)    Diabetes mellitus without complication ([place])    History of kidney stones    Hyperlipemia    Hypertension    Nonischemic cardiomyopathy ([place])     Past Surgical History:  Past Surgical History:  Procedure Laterality Date   ATRIAL FIBRILLATION ABLATION N/A 02/07/2020   Procedure: ATRIAL FIBRILLATION ABLATION;  Surgeon: Constance Haw, MD;  Location: [place] [facility];  Service: Cardiovascular;  Laterality: N/A;   CARDIOVERSION N/A 09/08/2019   Procedure: CARDIOVERSION;  Surgeon: Larey Dresser, MD;  Location: [facility] ENDOSCOPY;  Service: Cardiovascular;  Laterality: N/A;   CATARACT EXTRACTION W/ INTRAOCULAR LENS IMPLANT     CHOLECYSTECTOMY     COLONOSCOPY  09/04/2010    RMR: diverticula, cecal polyp = tubular adenoma; Repeat in 5 years   COLONOSCOPY N/A 10/10/2015   Procedure: COLONOSCOPY;  Surgeon: Daneil Dolin, MD;  [facility];  Service: Endoscopy;  Laterality: N/A;  [place]     RIGHT/LEFT HEART CATH AND CORONARY ANGIOGRAPHY N/A 08/04/2019   Procedure: RIGHT/LEFT HEART CATH AND CORONARY ANGIOGRAPHY;  Surgeon: Larey Dresser, MD;  Location: [place] [facility];  Service: Cardiovascular;  Laterality: N/A;   TEE WITHOUT CARDIOVERSION N/A 08/07/2019   Procedure: TRANSESOPHAGEAL ECHOCARDIOGRAM (TEE);  Surgeon: Larey Dresser, MD;  Location: [facility] ENDOSCOPY;  Service: Cardiovascular;  Laterality: N/A;   TEE WITHOUT CARDIOVERSION N/A 09/08/2019   Procedure: TRANSESOPHAGEAL ECHOCARDIOGRAM (TEE);  Surgeon: Larey Dresser, MD;  Location: [facility] ENDOSCOPY;  Service: Cardiovascular;  Laterality: N/A;   TRANSURETHRAL RESECTION OF PROSTATE N/A 11/27/2019   Procedure: TRANSURETHRAL RESECTION OF THE PROSTATE (TURP);  Surgeon: Kathie Rhodes, MD;  Location: [facility];  Service: Urology;  Laterality: N/A;    Allergies:  No Known Allergies  Family History:  Family History  Problem Relation Age of Onset   Diabetes Mother    Hypertension Mother    Heart disease Mother    Diabetes Father    Hypertension Father    Heart disease Father    Stroke Father    Colon cancer Neg Hx     Social History:  Social History   Tobacco Use   Smoking status: Light Smoker  Types: Cigars   Smokeless tobacco: Current    Types: Chew   Tobacco comments:    rarely chews tobacco cigar  Vaping Use   Vaping Use: Never used  Substance Use Topics   Alcohol use: Yes    Alcohol/week: 1.0 standard drink of alcohol    Types: 1 Cans of beer per week   Drug use: No    Review of symptoms:  Constitutional:  Negative for unexplained weight loss, night sweats, fever, chills ENT:  Negative for nose bleeds, sinus pain, painful swallowing CV:  Negative for chest pain,  shortness of breath, exercise intolerance, palpitations, loss of consciousness Resp:  Negative for cough, wheezing, shortness of breath GI:  Negative for nausea, vomiting, diarrhea, bloody stools GU:  Positives noted in HPI; otherwise negative for gross hematuria, dysuria, urinary incontinence Neuro:  Negative for seizures, poor balance, limb weakness, slurred speech Psych:  Negative for lack of energy, depression, anxiety Endocrine:  Negative for polydipsia, polyuria, symptoms of hypoglycemia (dizziness, hunger, sweating) Hematologic:  Negative for anemia, purpura, petechia, prolonged or excessive bleeding, use of anticoagulants  Allergic:  Negative for difficulty breathing or choking as a result of exposure to anything; no shellfish allergy; no allergic response (rash/itch) to materials, foods  Physical exam: BP 117/71   Pulse 75   Ht '5\' 9"'$  (1.753 m)   Wt 195 lb (88.5 kg)   BMI 28.80 kg/m  GENERAL APPEARANCE:  Well appearing, well developed, well nourished, NAD HEENT: Atraumatic, Normocephalic, oropharynx clear. NECK: Supple without lymphadenopathy or thyromegaly. LUNGS: Clear to auscultation bilaterally. HEART: Regular Rate and Rhythm without murmurs, gallops, or rubs. ABDOMEN: Soft, non-tender, No Masses. EXTREMITIES: Moves all extremities well.  Without clubbing, cyanosis, or edema. NEUROLOGIC:  Alert and oriented x 3, normal gait, CN II-XII grossly intact.  MENTAL STATUS:  Appropriate. BACK:  Non-tender to palpation.  No CVAT SKIN:  Warm, dry and intact.   GU: Penis:  uncircumcised Meatus: Normal Scrotum: normal, no masses Testis: normal without masses bilateral Prostate: 50 g, NT, no nodules Rectum: Normal tone,  no masses or tenderness   Results: U/A: 6-10 WBCs, 0-2 RBCs, many bacteria, nitrite positive

## 2022-03-21 LAB — URINE CULTURE

## 2022-03-23 ENCOUNTER — Telehealth: Payer: Self-pay

## 2022-03-23 MED ORDER — SULFAMETHOXAZOLE-TRIMETHOPRIM 800-160 MG PO TABS: 1.0000 | ORAL_TABLET | Freq: Two times a day (BID) | ORAL | 0 refills | Status: AC

## 2022-03-23 NOTE — Telephone Encounter (Signed)
Unable to reach patient by phone for results, left a voicemail requesting a call back.  Sent a Pharmacist, community message informing patient of MD's response to culture results.

## 2022-03-23 NOTE — Telephone Encounter (Signed)
-----   Message from Primus Bravo, MD sent at 03/23/2022 10:29 AM EDT ----- Please notify patient that his urine culture shows evidence of a UTI.   Rx for Bactrim DS sent to pharmacy.  Keep f/u appt as scheduled.

## 2022-03-23 NOTE — Addendum Note (Signed)
Addended by: Primus Bravo on: 03/23/2022 10:29 AM   Modules accepted: Orders

## 2022-03-24 ENCOUNTER — Telehealth: Payer: Self-pay

## 2022-03-24 NOTE — Telephone Encounter (Signed)
Patient aware of MD response to urine culture as well as rx.  He will f/u as scheduled.

## 2022-03-24 NOTE — Telephone Encounter (Signed)
-----   Message from Primus Bravo, MD sent at 03/23/2022 10:29 AM EDT ----- Please notify patient that his urine culture shows evidence of a UTI.   Rx for Bactrim DS sent to pharmacy.  Keep f/u appt as scheduled.

## 2022-04-16 ENCOUNTER — Ambulatory Visit: Payer: Medicare Other | Admitting: Urology

## 2022-04-16 ENCOUNTER — Encounter: Payer: Self-pay | Admitting: Urology

## 2022-04-16 VITALS — BP 102/67 | HR 72 | Ht 69.0 in | Wt 195.0 lb

## 2022-04-16 DIAGNOSIS — Z87440 Personal history of urinary (tract) infections: Secondary | ICD-10-CM

## 2022-04-16 DIAGNOSIS — R3129 Other microscopic hematuria: Secondary | ICD-10-CM | POA: Diagnosis not present

## 2022-04-16 LAB — MICROSCOPIC EXAMINATION
Bacteria, UA: NONE SEEN
RBC, Urine: NONE SEEN /hpf (ref 0–2)

## 2022-04-16 LAB — URINALYSIS, ROUTINE W REFLEX MICROSCOPIC
Bilirubin, UA: NEGATIVE
Ketones, UA: NEGATIVE
Leukocytes,UA: NEGATIVE
Nitrite, UA: NEGATIVE
Protein,UA: NEGATIVE
Specific Gravity, UA: 1.02 (ref 1.005–1.030)
Urobilinogen, Ur: 0.2 mg/dL (ref 0.2–1.0)
pH, UA: 5.5 (ref 5.0–7.5)

## 2022-04-16 NOTE — Progress Notes (Signed)
Assessment: 1. Microscopic hematuria   2. History of UTI      Plan: No evidence of microscopic hematuria or UTI today.  No further evaluation indicated at this time. Return to office 6 months  Chief Complaint:  Chief Complaint  Patient presents with   Hematuria    History of Present Illness:  Chris Guerrero is a 81 y.o. male who is seen for further evaluation of microscopic hematuria and UTI. Urinalysis from 02/11/2022 showed 6 RBCs, 6 WBCs. He is status post TURP by Dr. Karsten Ro in June 2021.  Cystoscopy at that time demonstrated no bladder lesions. At his initial visit in 10/23, he was not having any significant lower urinary tract symptoms other than nocturia x1.  No dysuria or gross hematuria.   He has a remote history of kidney stones >10 years ago.  No recent stone symptoms.  No history of UTIs. U/A from 03/19/22 showed 0-2 RBCs, 6-10 WBCs, many bacteria, nitrite positive. Urine culture grew >100K Staph aureus.  He was treated with Bactrim DS x7 days.  He returns today for follow-up.  No new urinary symptoms.  No dysuria or gross hematuria. IPSS = 4 today.  Portions of the above documentation were copied from a prior visit for review purposes only.   Past Medical History:  Past Medical History:  Diagnosis Date   Atrial fibrillation ([place])    BPH with obstruction/lower urinary tract symptoms    CHF (congestive heart failure) (H[facility])    CKD (chronic kidney disease), stage III (HCC)    Diabetes mellitus without complication ([place])    History of kidney stones    Hyperlipemia    Hypertension    Nonischemic cardiomyopathy ([place])     Past Surgical History:  Past Surgical History:  Procedure Laterality Date   ATRIAL FIBRILLATION ABLATION N/A 02/07/2020   Procedure: ATRIAL FIBRILLATION ABLATION;  Surgeon: Constance Haw, MD;  Location: [place] [facility];  Service: Cardiovascular;  Laterality: N/A;   CARDIOVERSION N/A 09/08/2019   Procedure: CARDIOVERSION;  Surgeon:  Larey Dresser, MD;  Location: [facility] ENDOSCOPY;  Service: Cardiovascular;  Laterality: N/A;   CATARACT EXTRACTION W/ INTRAOCULAR LENS IMPLANT     CHOLECYSTECTOMY     COLONOSCOPY  09/04/2010   RMR: diverticula, cecal polyp = tubular adenoma; Repeat in 5 years   COLONOSCOPY N/A 10/10/2015   Procedure: COLONOSCOPY;  Surgeon: Daneil Dolin, MD;  [facility];  Service: Endoscopy;  Laterality: N/A;  [place]     RIGHT/LEFT HEART CATH AND CORONARY ANGIOGRAPHY N/A 08/04/2019   Procedure: RIGHT/LEFT HEART CATH AND CORONARY ANGIOGRAPHY;  Surgeon: Larey Dresser, MD;  Location: [place] [facility];  Service: Cardiovascular;  Laterality: N/A;   TEE WITHOUT CARDIOVERSION N/A 08/07/2019   Procedure: TRANSESOPHAGEAL ECHOCARDIOGRAM (TEE);  Surgeon: Larey Dresser, MD;  Location: [facility] ENDOSCOPY;  Service: Cardiovascular;  Laterality: N/A;   TEE WITHOUT CARDIOVERSION N/A 09/08/2019   Procedure: TRANSESOPHAGEAL ECHOCARDIOGRAM (TEE);  Surgeon: Larey Dresser, MD;  Location: [facility] ENDOSCOPY;  Service: Cardiovascular;  Laterality: N/A;   TRANSURETHRAL RESECTION OF PROSTATE N/A 11/27/2019   Procedure: TRANSURETHRAL RESECTION OF THE PROSTATE (TURP);  Surgeon: Kathie Rhodes, MD;  Location: [facility];  Service: Urology;  Laterality: N/A;    Allergies:  No Known Allergies  Family History:  Family History  Problem Relation Age of Onset   Diabetes Mother    Hypertension Mother    Heart disease Mother    Diabetes Father  Hypertension Father    Heart disease Father    Stroke Father    Colon cancer Neg Hx     Social History:  Social History   Tobacco Use   Smoking status: Light Smoker    Types: Cigars   Smokeless tobacco: Current    Types: Chew   Tobacco comments:    rarely chews tobacco cigar  Vaping Use   Vaping Use: Never used  Substance Use Topics   Alcohol use: Yes    Alcohol/week: 1.0 standard drink of alcohol    Types: 1 Cans of beer per week   Drug use: No     ROS: Constitutional:  Negative for fever, chills, weight loss CV: Negative for chest pain, previous MI, hypertension Respiratory:  Negative for shortness of breath, wheezing, sleep apnea, frequent cough GI:  Negative for nausea, vomiting, bloody stool, GERD  Physical exam: BP 102/67   Pulse 72   Ht '5\' 9"'$  (1.753 m)   Wt 195 lb (88.5 kg)   BMI 28.80 kg/m  GENERAL APPEARANCE:  Well appearing, well developed, well nourished, NAD HEENT:  Atraumatic, normocephalic, oropharynx clear NECK:  Supple without lymphadenopathy or thyromegaly ABDOMEN:  Soft, non-tender, no masses EXTREMITIES:  Moves all extremities well, without clubbing, cyanosis, or edema NEUROLOGIC:  Alert and oriented x 3, normal gait, CN II-XII grossly intact MENTAL STATUS:  appropriate BACK:  Non-tender to palpation, No CVAT SKIN:  Warm, dry, and intact   Results: U/A: 0-5 WBCs, 0 RBCs

## 2022-05-05 ENCOUNTER — Ambulatory Visit (HOSPITAL_COMMUNITY)
Admission: RE | Admit: 2022-05-05 | Discharge: 2022-05-05 | Disposition: A | Discharge status: Home / Self Care | Payer: Medicare Other | Source: Ambulatory Visit | Attending: Cardiology | Admitting: Cardiology

## 2022-05-05 ENCOUNTER — Encounter (HOSPITAL_COMMUNITY): Payer: Self-pay | Admitting: Cardiology

## 2022-05-05 VITALS — BP 90/50 | HR 72 | Wt 198.0 lb

## 2022-05-05 DIAGNOSIS — Z79899 Other long term (current) drug therapy: Secondary | ICD-10-CM | POA: Diagnosis not present

## 2022-05-05 DIAGNOSIS — R338 Other retention of urine: Secondary | ICD-10-CM | POA: Diagnosis not present

## 2022-05-05 DIAGNOSIS — N138 Other obstructive and reflux uropathy: Secondary | ICD-10-CM | POA: Diagnosis not present

## 2022-05-05 DIAGNOSIS — I5042 Chronic combined systolic (congestive) and diastolic (congestive) heart failure: Secondary | ICD-10-CM | POA: Diagnosis not present

## 2022-05-05 DIAGNOSIS — I480 Paroxysmal atrial fibrillation: Secondary | ICD-10-CM | POA: Diagnosis not present

## 2022-05-05 DIAGNOSIS — N183 Chronic kidney disease, stage 3 unspecified: Secondary | ICD-10-CM | POA: Diagnosis not present

## 2022-05-05 DIAGNOSIS — I493 Ventricular premature depolarization: Secondary | ICD-10-CM | POA: Diagnosis not present

## 2022-05-05 DIAGNOSIS — R000 Tachycardia, unspecified: Secondary | ICD-10-CM | POA: Insufficient documentation

## 2022-05-05 DIAGNOSIS — I130 Hypertensive heart and chronic kidney disease with heart failure and stage 1 through stage 4 chronic kidney disease, or unspecified chronic kidney disease: Secondary | ICD-10-CM | POA: Diagnosis not present

## 2022-05-05 DIAGNOSIS — Z7901 Long term (current) use of anticoagulants: Secondary | ICD-10-CM | POA: Diagnosis not present

## 2022-05-05 DIAGNOSIS — E1122 Type 2 diabetes mellitus with diabetic chronic kidney disease: Secondary | ICD-10-CM | POA: Diagnosis not present

## 2022-05-05 DIAGNOSIS — E785 Hyperlipidemia, unspecified: Secondary | ICD-10-CM | POA: Diagnosis not present

## 2022-05-05 DIAGNOSIS — I428 Other cardiomyopathies: Secondary | ICD-10-CM | POA: Diagnosis not present

## 2022-05-05 DIAGNOSIS — Z1152 Encounter for screening for COVID-19: Secondary | ICD-10-CM | POA: Insufficient documentation

## 2022-05-05 LAB — BASIC METABOLIC PANEL
Anion gap: 12 (ref 5–15)
BUN: 21 mg/dL (ref 8–23)
CO2: 23 mmol/L (ref 22–32)
Calcium: 9.7 mg/dL (ref 8.9–10.3)
Chloride: 103 mmol/L (ref 98–111)
Creatinine, Ser: 1.18 mg/dL (ref 0.61–1.24)
GFR, Estimated: >60 mL/min (ref >60)
Glucose, Bld: 165 mg/dL — ABNORMAL HIGH (ref 70–99)
Potassium: 4.6 mmol/L (ref 3.5–5.1)
Sodium: 138 mmol/L (ref 135–145)

## 2022-05-05 LAB — CBC
HCT: 47.4 % (ref 39.0–52.0)
Hemoglobin: 15.9 g/dL (ref 13.0–17.0)
MCH: 34.3 pg — ABNORMAL HIGH (ref 26.0–34.0)
MCHC: 33.5 g/dL (ref 30.0–36.0)
MCV: 102.2 fL — ABNORMAL HIGH (ref 80.0–100.0)
Platelets: 204 10*3/uL (ref 150–400)
RBC: 4.64 MIL/uL (ref 4.22–5.81)
RDW: 12.3 % (ref 11.5–15.5)
WBC: 8.1 10*3/uL (ref 4.0–10.5)
nRBC: 0 % (ref 0.0–0.2)

## 2022-05-05 NOTE — Progress Notes (Signed)
t  Advanced Heart Failure Clinic Note    PCP: [name], MD AHF: Dr. Aundra Dubin  Urology: Dr Karsten Ro   HPI: 81 y.o. male w/  prior history of HTN and hyperlipidemia, who initially reported to [facility]  w/ complaints of about 1 month of cough and dyspnea with exertion as well as progressive edema. No chest pain. He saw his PCP prior to coming to the ED on 08/01/19 and was noted to be in atrial fibrillation with RVR and he was then sent to [facility] for admission. Hs-TnI was flat at 25 => 27.  BNP elevated, 1,068. COVID negative. He was initially tried on diltiazem gtt but became hypotensive and changed to amiodarone gtt.  Echo was done, showing EF < 20% with diffuse hypokinesis, severely decreased RV function. He was transferred to [facility] on 2/18 for HF work up and cath.    On arrival to [facility], he was found to be volume overloaded w/ soft SBPs and rising SCr up to 1.9 with attempts at diuresis, concerning for low output. PICC line was placed to measure co-ox and CVP. Initial co-ox low at 46%. He was started on milrinone and IV Lasix. IV amiodarone continued for afib + IV heparin. RHC/LHC 08/04/19 with no coronary disease, mildly elevated filling pressures. Co-ox improved w/ milrinone. He was ultimately weaned off milrinone and co-ox remained stable. He diuresed well w/ IV Lasix and later changed to torsemide. SCr improved w/ diuresis, down to 1.32.    He was set up for TEE/DCCV on 08/07/19 and found to have LAA thrombus on TEE. DCCV canceled. IV amiodarone was discontinued and he was changed to PO, 200 mg bid. Placed on Eliquis 5 bid. ASA discontinued. Plan to repeat TEE/DCCV after 1 month of chronic anticoagulation. He was advised to continue amiodarone 200 mg bid x 7 days, followed by 200 mg daily.    Also of note, he develop urinary retention due to BPH. Had over 500cc on bladder scan. Multiple attempts were made by the nursing staff to place a foley which were unsuccessful. Urology was consulted and placed 16  french council foley catheter. Subsequently, he had mild hematuria which ultimately cleared. Urology recommended foley to continue on discharge with plans to be followed closely in outpatient clinic. He was discharged from the hospital 08/09/19.   He had f/u w/ urology 08/15/19 and foley removed. After returning home, he redeveloped urinary retention and went to ED next day. Foley cath reinserted.   S/P DC-CV 09/08/19 with restoration of NSR.   He had TURP in 6/21.   Cardiac MRI in 6/21 showed EF 35%, LGE pattern consistent with lateral infarction.   He saw Dr. Curt Bears and atrial fibrillation ablation was recommended. S/P A fib ablation 02/2020. Zio patch was placed to assess PVC burden. Had 16% PVC burden.    Echo in 1/22 showed EF up to 50-55% with normal RV.  Zio patch was repeated in 1/22, now 8% PVCs.   Echo was done today and reviewed, EF 50-55%, mild LVH, normal RV.   Patient returns for followup of CHF.  He is in NSR, no palpitations. No exertional dyspnea but not very active. No chest pain.  Occasional lightheadedness with rapid standing.  No BRBPR/melena. Weight down 5 lbs.   Labs (3/21): K 4.7, creatinine 1.6, TSH elevated, free T3/free T4 normal, LFTs normal.  Labs (5/21): LFTs normal, digoxin 0.8, TSH mildly elevated, free T3 and free T4 normal.  Labs (6/21): K 4.9 => 5.3, creatinine 1.77 =>  1.65 Labs (02/01/20): K 5.1 Creatinine 1.5  Labs (10/21): K 4.6, creatinine 1.34 Labs (1/22): K 4.9, creatinine 1.5 Labs (7/22): LDL 36 Labs (8/22): K 4.8, creatinine 1.26 Labs (9/22): K 4.3, creatinine 1.34 Labs (4/23): LDL 31, TGs 174, K 4.3, creatinine 1.2, hgb 15.1 Labs (8/23): K 4.8, creatinine 1.06  ECG: NSR, normal (personally reviewed)  PMH: 1. Atrial fibrillation: Paroxysmal.   - DCCV to NSR 3/21.  - Atrial fibrillation ablation 9/21 2. BPH with urinary retention - TURP 6/21 3. Type 2 diabetes 4. HTN 5. Nephrolithiasis 6. CKD stage 3 7. Chronic systolic CHF: Nonischemic  cardiomyopathy.  - Echo (2/21): EF < 20%, with severely decreased RV systolic function. - LHC (2/21): No significant CAD - Cardiac MRI (6/21): EF 35%, LGE pattern consistent with lateral infarction.  - Echo (1/22): EF 50-55%, normal RV.  - Echo (11/23): EF 50-55%, mild LVH, normal RV. 8. PVCs: 8/21 Zio patch with 16% PVCs.  - Zio patch (1/22): 8% PVCs 9. ABIs (9/22): Normal   Current Outpatient Medications  Medication Sig Dispense Refill   apixaban (ELIQUIS) 5 MG TABS tablet Take 1 tablet (5 mg total) by mouth 2 (two) times daily. 60 tablet 11   atorvastatin (LIPITOR) 10 MG tablet Take 10 mg by mouth at bedtime.      carvedilol (COREG) 12.5 MG tablet TAKE 1 TABLET BY MOUTH TWICE DAILY WITH MEALS. 180 tablet 3   empagliflozin (JARDIANCE) 25 MG TABS tablet Take 25 mg by mouth daily.     metFORMIN (GLUCOPHAGE) 500 MG tablet Take 1 tablet by mouth in the morning and at bedtime.     Multiple Vitamin (MULTIVITAMIN WITH MINERALS) TABS tablet Take 1 tablet by mouth daily.     sacubitril-valsartan (ENTRESTO) 49-51 MG Take 1 tablet by mouth 2 (two) times daily. 60 tablet 11   spironolactone (ALDACTONE) 25 MG tablet TAKE 1 TABLET BY MOUTH  DAILY 90 tablet 3   traZODone (DESYREL) 50 MG tablet Take 50 mg by mouth at bedtime.     No current facility-administered medications for this encounter.    No Known Allergies    Social History   Socioeconomic History   Marital status: Divorced    Spouse name: Not on file   Number of children: Not on file   Years of education: Not on file   Highest education level: Not on file  Occupational History   Not on file  Tobacco Use   Smoking status: Light Smoker    Types: Cigars   Smokeless tobacco: Current    Types: Chew   Tobacco comments:    rarely chews tobacco cigar  Vaping Use   Vaping Use: Never used  Substance and Sexual Activity   Alcohol use: Yes    Alcohol/week: 1.0 standard drink of alcohol    Types: 1 Cans of beer per week   Drug use:  No   Sexual activity: Not Currently  Other Topics Concern   Not on file  Social History Narrative   Not on file   Social Determinants of Health   Financial Resource Strain: Not on file  Food Insecurity: Not on file  Transportation Needs: Not on file  Physical Activity: Not on file  Stress: Not on file  Social Connections: Not on file  Intimate Partner Violence: Not on file      Family History  Problem Relation Age of Onset   Diabetes Mother    Hypertension Mother    Heart disease Mother  Diabetes Father    Hypertension Father    Heart disease Father    Stroke Father    Colon cancer Neg Hx     Vitals:   05/05/22 0856  BP: (!) 90/50  Pulse: 72  SpO2: 97%  Weight: 89.8 kg (198 lb)   Wt Readings from Last 3 Encounters:  05/05/22 89.8 kg (198 lb)  04/16/22 88.5 kg (195 lb)  03/19/22 88.5 kg (195 lb)   PHYSICAL EXAM: General: NAD Neck: No JVD, no thyromegaly or thyroid nodule.  Lungs: Clear to auscultation bilaterally with normal respiratory effort. CV: Nondisplaced PMI.  Heart regular S1/S2, no S3/S4, no murmur.  No peripheral edema.  No carotid bruit.  Normal pedal pulses.  Abdomen: Soft, nontender, no hepatosplenomegaly, no distention.  Skin: Intact without lesions or rashes.  Neurologic: Alert and oriented x 3.  Psych: Normal affect. Extremities: No clubbing or cyanosis.  HEENT: Normal.   ASSESSMENT & PLAN: 1. Chronic systolic CHF: Echo 7/12 with EF <20%, severely decreased RV function. Nonischemic cardiomyopathy, ?tachycardia-mediated due to atrial fibrillation of uncertain duration.  He also has history of frequent PVCs (16% on 8/21 Zio patch).  He required milrinone during 2/21 admission but was titrated off.  Coronary angiography in 2/21 with no significant coronary disease. Cardiac MRI in 6/21 with EF up to 35%, LGE pattern consistent with lateral MI (surprising given no significant CAD on cath).  Echo in 1/22 with EF improved to 50-55%, normal RV.  Echo  today showed EF still in the 50-55% range.  NYHA class II symptoms, not volume overloaded.  - Continue Coreg 12.5 mg bid.   - Continue spironolactone 12.5 mg daily, BMET today.  - Continue Entresto 49/51 bid.   - Continue empagliflozin with CKD and CHF 2. Atrial fibrillation: Paroxysmal.  Admitted with afib/RVR of uncertain duration 4/58.  Unsure if atrial fibrillation/RVR was cause of CMP (tachy-mediated) or an effect of the cardiomyopathy.  TEE on 2/22 showed LA appendage thrombus. He was anticoagulated, then underwent DC-CV on 09/08/19 with conversion to NSR. Atrial fibrillation ablation in 9/21.  He is in NSR today.  - He no longer takes amiodarone.   - Continue apixaban, CBC today. 3. CKD stage 3: BMET today. 4. BPH/urinary obstruction: Has had TURP.  5. PVCs: Zio patch 01/2020 with 16% PVCs.  He is now off amiodarone.  Repeat Zio patch with 8% PVCs in 1/22.  -  Continue Coreg 12.5 mg bid.   BMET in 3 months, see APP 6 months.   Loralie Champagne, MD 05/05/22

## 2022-05-05 NOTE — Patient Instructions (Signed)
There has been no changes to your medications.  Labs done today, your results will be available in MyChart, we will contact you for abnormal readings.  Repeat blood work in 3 months at [place] physician recommends that you schedule a follow-up appointment in: 6 months (June 2024)  **please call the office in April to arrange your follow up appointment **  If you have any questions or concerns before your next appointment please send [place] a message through [place] or call our office at [phone].    TO LEAVE A MESSAGE FOR THE NURSE SELECT OPTION 2, PLEASE LEAVE A MESSAGE INCLUDING: YOUR NAME DATE OF BIRTH CALL BACK NUMBER REASON FOR CALL**this is important as we prioritize the call backs  YOU WILL RECEIVE A CALL BACK THE SAME DAY AS LONG AS YOU CALL BEFORE 4:00 PM  At the [facility], you and your health needs are our priority. As part of our continuing mission to provide you with exceptional heart care, we have created designated Provider Care Teams. These Care Teams include your primary Cardiologist (physician) and Advanced Practice Providers (APPs- Physician Assistants and Nurse Practitioners) who all work together to provide you with the care you need, when you need it.   You may see any of the following providers on your designated Care Team at your next follow up: Dr Glori Bickers Dr Loralie Champagne Dr. Roxana Hires, NP Lyda Jester, [place] [facility] [place], [place] Forestine Na, NP Audry Riles, PharmD   Please be sure to bring in all your medications bottles to every appointment.

## 2022-05-14 DIAGNOSIS — I10 Essential (primary) hypertension: Secondary | ICD-10-CM | POA: Diagnosis not present

## 2022-05-14 DIAGNOSIS — E785 Hyperlipidemia, unspecified: Secondary | ICD-10-CM | POA: Diagnosis not present

## 2022-05-14 DIAGNOSIS — E1129 Type 2 diabetes mellitus with other diabetic kidney complication: Secondary | ICD-10-CM | POA: Diagnosis not present

## 2022-06-03 DIAGNOSIS — Z79899 Other long term (current) drug therapy: Secondary | ICD-10-CM | POA: Diagnosis not present

## 2022-06-03 DIAGNOSIS — E875 Hyperkalemia: Secondary | ICD-10-CM | POA: Diagnosis not present

## 2022-06-03 DIAGNOSIS — E1129 Type 2 diabetes mellitus with other diabetic kidney complication: Secondary | ICD-10-CM | POA: Diagnosis not present

## 2022-06-03 DIAGNOSIS — I5020 Unspecified systolic (congestive) heart failure: Secondary | ICD-10-CM | POA: Diagnosis not present

## 2022-06-10 DIAGNOSIS — E1129 Type 2 diabetes mellitus with other diabetic kidney complication: Secondary | ICD-10-CM | POA: Diagnosis not present

## 2022-06-10 DIAGNOSIS — E875 Hyperkalemia: Secondary | ICD-10-CM | POA: Diagnosis not present

## 2022-06-10 DIAGNOSIS — I5020 Unspecified systolic (congestive) heart failure: Secondary | ICD-10-CM | POA: Diagnosis not present

## 2022-07-23 ENCOUNTER — Encounter (HOSPITAL_COMMUNITY): Payer: Self-pay | Admitting: *Deleted

## 2022-08-20 DIAGNOSIS — H26493 Other secondary cataract, bilateral: Secondary | ICD-10-CM | POA: Diagnosis not present

## 2022-08-20 DIAGNOSIS — Z961 Presence of intraocular lens: Secondary | ICD-10-CM | POA: Diagnosis not present

## 2022-08-20 DIAGNOSIS — E119 Type 2 diabetes mellitus without complications: Secondary | ICD-10-CM | POA: Diagnosis not present

## 2022-09-02 DIAGNOSIS — E1129 Type 2 diabetes mellitus with other diabetic kidney complication: Secondary | ICD-10-CM | POA: Diagnosis not present

## 2022-09-09 ENCOUNTER — Other Ambulatory Visit (HOSPITAL_COMMUNITY)
Admission: RE | Admit: 2022-09-09 | Discharge: 2022-09-09 | Disposition: A | Discharge status: Home / Self Care | Payer: Medicare Other | Source: Ambulatory Visit | Attending: Cardiology | Admitting: Cardiology

## 2022-09-09 DIAGNOSIS — E875 Hyperkalemia: Secondary | ICD-10-CM | POA: Diagnosis not present

## 2022-09-09 DIAGNOSIS — I5030 Unspecified diastolic (congestive) heart failure: Secondary | ICD-10-CM | POA: Diagnosis not present

## 2022-09-09 DIAGNOSIS — I5042 Chronic combined systolic (congestive) and diastolic (congestive) heart failure: Secondary | ICD-10-CM | POA: Insufficient documentation

## 2022-09-09 DIAGNOSIS — E1122 Type 2 diabetes mellitus with diabetic chronic kidney disease: Secondary | ICD-10-CM | POA: Diagnosis not present

## 2022-09-09 DIAGNOSIS — I480 Paroxysmal atrial fibrillation: Secondary | ICD-10-CM | POA: Diagnosis not present

## 2022-09-09 LAB — BASIC METABOLIC PANEL
Anion gap: 9 (ref 5–15)
BUN: 26 mg/dL — ABNORMAL HIGH (ref 8–23)
CO2: 25 mmol/L (ref 22–32)
Calcium: 9 mg/dL (ref 8.9–10.3)
Chloride: 101 mmol/L (ref 98–111)
Creatinine, Ser: 1.06 mg/dL (ref 0.61–1.24)
GFR, Estimated: >60 mL/min (ref >60)
Glucose, Bld: 171 mg/dL — ABNORMAL HIGH (ref 70–99)
Potassium: 4.2 mmol/L (ref 3.5–5.1)
Sodium: 135 mmol/L (ref 135–145)

## 2022-10-15 ENCOUNTER — Ambulatory Visit: Payer: Medicare Other | Admitting: Urology

## 2022-10-15 ENCOUNTER — Encounter: Payer: Self-pay | Admitting: Urology

## 2022-10-15 VITALS — BP 123/78 | HR 71 | Ht 69.0 in | Wt 198.0 lb

## 2022-10-15 DIAGNOSIS — Z87440 Personal history of urinary (tract) infections: Secondary | ICD-10-CM | POA: Diagnosis not present

## 2022-10-15 DIAGNOSIS — R3129 Other microscopic hematuria: Secondary | ICD-10-CM

## 2022-10-15 LAB — URINALYSIS, ROUTINE W REFLEX MICROSCOPIC
Bilirubin, UA: NEGATIVE
Leukocytes,UA: NEGATIVE
Nitrite, UA: NEGATIVE
Protein,UA: NEGATIVE
Specific Gravity, UA: 1.015 (ref 1.005–1.030)
Urobilinogen, Ur: 0.2 mg/dL (ref 0.2–1.0)
pH, UA: 6.5 (ref 5.0–7.5)

## 2022-10-15 LAB — MICROSCOPIC EXAMINATION
Bacteria, UA: NONE SEEN
WBC, UA: NONE SEEN /hpf (ref 0–5)

## 2022-10-15 NOTE — Progress Notes (Signed)
Subjective:  1. Microscopic hematuria   2. History of UTI      GU Hx: Chris Guerrero is a 82 y.o. male who is seen for further evaluation of microscopic hematuria and UTI. Urinalysis from 02/11/2022 showed 6 RBCs, 6 WBCs. He is status post TURP by Dr. Vernie Ammons in June 2021.  Cystoscopy at that time demonstrated no bladder lesions. At his initial visit in 10/23, he was not having any significant lower urinary tract symptoms other than nocturia x1.  No dysuria or gross hematuria.   He has a remote history of kidney stones >10 years ago.  No recent stone symptoms.  No history of UTIs. U/A from 03/19/22 showed 0-2 RBCs, 6-10 WBCs, many bacteria, nitrite positive.  10/15/22: Chris Guerrero returns today in f/u for his history of UTI's and microhematuria.  He has had no gross hematuria.  He is voiding well with a good stream and he feels he empties.  He has no incontinence.  He has nocturia x 1.  UA today has trace blood and 3+ glucose.      ROS:  ROS:  A complete review of systems was performed.  All systems are negative except for pertinent findings as noted.   ROS  No Known Allergies  Outpatient Encounter Medications as of 10/15/2022  Medication Sig   apixaban (ELIQUIS) 5 MG TABS tablet Take 1 tablet (5 mg total) by mouth 2 (two) times daily.   carvedilol (COREG) 12.5 MG tablet TAKE 1 TABLET BY MOUTH TWICE DAILY WITH MEALS.   empagliflozin (JARDIANCE) 25 MG TABS tablet Take 25 mg by mouth daily.   metFORMIN (GLUCOPHAGE) 500 MG tablet Take 1 tablet by mouth in the morning and at bedtime.   Multiple Vitamin (MULTIVITAMIN WITH MINERALS) TABS tablet Take 1 tablet by mouth daily.   sacubitril-valsartan (ENTRESTO) 49-51 MG Take 1 tablet by mouth 2 (two) times daily.   spironolactone (ALDACTONE) 25 MG tablet TAKE 1 TABLET BY MOUTH  DAILY   traZODone (DESYREL) 50 MG tablet Take 50 mg by mouth at bedtime.   [DISCONTINUED] atorvastatin (LIPITOR) 10 MG tablet Take 10 mg by mouth at bedtime.    No  facility-administered encounter medications on file as of 10/15/2022.    Past Medical History:  Diagnosis Date   Atrial fibrillation (HCC)    BPH with obstruction/lower urinary tract symptoms    CHF (congestive heart failure) (HCC)    CKD (chronic kidney disease), stage III (HCC)    Diabetes mellitus without complication (HCC)    History of kidney stones    Hyperlipemia    Hypertension    Nonischemic cardiomyopathy (HCC)     Past Surgical History:  Procedure Laterality Date   ATRIAL FIBRILLATION ABLATION N/A 02/07/2020   Procedure: ATRIAL FIBRILLATION ABLATION;  Surgeon: Regan Lemming, MD;  Location: [facility] INVASIVE CV LAB;  Service: Cardiovascular;  Laterality: N/A;   CARDIOVERSION N/A 09/08/2019   Procedure: CARDIOVERSION;  Surgeon: Laurey Morale, MD;  Location: [facility] ENDOSCOPY;  Service: Cardiovascular;  Laterality: N/A;   CATARACT EXTRACTION W/ INTRAOCULAR LENS IMPLANT     CHOLECYSTECTOMY     COLONOSCOPY  09/04/2010   RMR: diverticula, cecal polyp = tubular adenoma; Repeat in 5 years   COLONOSCOPY N/A 10/10/2015   Procedure: COLONOSCOPY;  Surgeon: Corbin Ade, MD;  Location: [facility] ENDO SUITE;  Service: Endoscopy;  Laterality: N/A;  1300   HERNIA REPAIR     RIGHT/LEFT HEART CATH AND CORONARY ANGIOGRAPHY N/A 08/04/2019   Procedure: RIGHT/LEFT HEART CATH AND  CORONARY ANGIOGRAPHY;  Surgeon: Laurey Morale, MD;  Location: [facility] INVASIVE CV LAB;  Service: Cardiovascular;  Laterality: N/A;   TEE WITHOUT CARDIOVERSION N/A 08/07/2019   Procedure: TRANSESOPHAGEAL ECHOCARDIOGRAM (TEE);  Surgeon: Laurey Morale, MD;  Location: [facility] ENDOSCOPY;  Service: Cardiovascular;  Laterality: N/A;   TEE WITHOUT CARDIOVERSION N/A 09/08/2019   Procedure: TRANSESOPHAGEAL ECHOCARDIOGRAM (TEE);  Surgeon: Laurey Morale, MD;  Location: [facility] ENDOSCOPY;  Service: Cardiovascular;  Laterality: N/A;   TRANSURETHRAL RESECTION OF PROSTATE N/A 11/27/2019   Procedure: TRANSURETHRAL RESECTION OF THE PROSTATE (TURP);   Surgeon: Ihor Gully, MD;  Location: [facility] ORS;  Service: Urology;  Laterality: N/A;    Social History   Socioeconomic History   Marital status: Divorced    Spouse name: Not on file   Number of children: Not on file   Years of education: Not on file   Highest education level: Not on file  Occupational History   Not on file  Tobacco Use   Smoking status: Light Smoker    Types: Cigars   Smokeless tobacco: Current    Types: Chew   Tobacco comments:    rarely chews tobacco cigar  Vaping Use   Vaping Use: Never used  Substance and Sexual Activity   Alcohol use: Yes    Alcohol/week: 1.0 standard drink of alcohol    Types: 1 Cans of beer per week   Drug use: No   Sexual activity: Not Currently  Other Topics Concern   Not on file  Social History Narrative   Not on file   Social Determinants of Health   Financial Resource Strain: Not on file  Food Insecurity: Not on file  Transportation Needs: Not on file  Physical Activity: Not on file  Stress: Not on file  Social Connections: Not on file  Intimate Partner Violence: Not on file    Family History  Problem Relation Age of Onset   Diabetes Mother    Hypertension Mother    Heart disease Mother    Diabetes Father    Hypertension Father    Heart disease Father    Stroke Father    Colon cancer Neg Hx        Objective: Vitals:   10/15/22 0946  BP: 123/78  Pulse: 71     Physical Exam  Lab Results:  PSA No results found for: "PSA" No results found for: "TESTOSTERONE" Results for orders placed or performed in visit on 10/15/22 (from the past 24 hour(s))  Urinalysis, Routine w reflex microscopic     Status: Abnormal   Collection Time: 10/15/22 10:05 AM  Result Value Ref Range   Specific Gravity, UA 1.015 1.005 - 1.030   pH, UA 6.5 5.0 - 7.5   Color, UA Yellow Yellow   Appearance Ur Clear Clear   Leukocytes,UA Negative Negative   Protein,UA Negative Negative/Trace   Glucose, UA 3+ (A) Negative   Ketones,  UA Trace (A) Negative   RBC, UA Trace (A) Negative   Bilirubin, UA Negative Negative   Urobilinogen, Ur 0.2 0.2 - 1.0 mg/dL   Nitrite, UA Negative Negative   Microscopic Examination See below:    Narrative   Performed at:  [address] - [facility] [address] [place], [place]  [id number] Lab Director: Chinita Pester MT, Phone:  [phone]  Microscopic Examination     Status: Abnormal   Collection Time: 10/15/22 10:05 AM   Urine  Result Value Ref Range   WBC, UA None seen 0 - 5 /  hpf   RBC, Urine 0-2 0 - 2 /hpf   Epithelial Cells (non renal) 0-10 0 - 10 /hpf   Crystals Present (A) N/A   Crystal Type Amorphous Sediment N/A   Bacteria, UA None seen None seen/Few   Narrative   Performed at:  [address] - [facility] [address] [place], [place]  [id number] Lab Director: Chinita Pester MT, Phone:  [phone]      Studies/Results: No results found. No results found for this or any previous visit.  No results found for this or any previous visit.  No results found for this or any previous visit.  No results found for this or any previous visit.  No results found for this or any previous visit.  No valid procedures specified. No results found for this or any previous visit.  No results found for this or any previous visit.    Assessment & Plan: History of UTI's.   He is infection free today.  Microhematuria.   No significant blood in the urine today.    No orders of the defined types were placed in this encounter.    Orders Placed This Encounter  Procedures   Microscopic Examination   Urinalysis, Routine w reflex microscopic      Return in about 1 year (around 10/15/2023).   CC: Carylon Perches, MD      Bjorn Pippin 10/16/2022

## 2022-11-05 ENCOUNTER — Encounter (HOSPITAL_COMMUNITY): Payer: Self-pay | Admitting: Cardiology

## 2022-11-05 ENCOUNTER — Ambulatory Visit (HOSPITAL_COMMUNITY)
Admission: RE | Admit: 2022-11-05 | Discharge: 2022-11-05 | Disposition: A | Discharge status: Home / Self Care | Payer: Medicare Other | Source: Ambulatory Visit | Attending: Cardiology | Admitting: Cardiology

## 2022-11-05 VITALS — BP 110/68 | HR 71 | Wt 200.0 lb

## 2022-11-05 DIAGNOSIS — I130 Hypertensive heart and chronic kidney disease with heart failure and stage 1 through stage 4 chronic kidney disease, or unspecified chronic kidney disease: Secondary | ICD-10-CM | POA: Diagnosis not present

## 2022-11-05 DIAGNOSIS — Z7901 Long term (current) use of anticoagulants: Secondary | ICD-10-CM | POA: Insufficient documentation

## 2022-11-05 DIAGNOSIS — N138 Other obstructive and reflux uropathy: Secondary | ICD-10-CM | POA: Insufficient documentation

## 2022-11-05 DIAGNOSIS — I493 Ventricular premature depolarization: Secondary | ICD-10-CM | POA: Diagnosis not present

## 2022-11-05 DIAGNOSIS — R338 Other retention of urine: Secondary | ICD-10-CM | POA: Insufficient documentation

## 2022-11-05 DIAGNOSIS — E1122 Type 2 diabetes mellitus with diabetic chronic kidney disease: Secondary | ICD-10-CM | POA: Diagnosis not present

## 2022-11-05 DIAGNOSIS — N183 Chronic kidney disease, stage 3 unspecified: Secondary | ICD-10-CM | POA: Insufficient documentation

## 2022-11-05 DIAGNOSIS — I428 Other cardiomyopathies: Secondary | ICD-10-CM | POA: Diagnosis not present

## 2022-11-05 DIAGNOSIS — I480 Paroxysmal atrial fibrillation: Secondary | ICD-10-CM | POA: Diagnosis not present

## 2022-11-05 DIAGNOSIS — E785 Hyperlipidemia, unspecified: Secondary | ICD-10-CM | POA: Insufficient documentation

## 2022-11-05 DIAGNOSIS — Z79899 Other long term (current) drug therapy: Secondary | ICD-10-CM | POA: Diagnosis not present

## 2022-11-05 DIAGNOSIS — I5042 Chronic combined systolic (congestive) and diastolic (congestive) heart failure: Secondary | ICD-10-CM | POA: Diagnosis not present

## 2022-11-05 DIAGNOSIS — F17290 Nicotine dependence, other tobacco product, uncomplicated: Secondary | ICD-10-CM | POA: Diagnosis not present

## 2022-11-05 LAB — BASIC METABOLIC PANEL
Anion gap: 10 (ref 5–15)
BUN: 24 mg/dL — ABNORMAL HIGH (ref 8–23)
CO2: 27 mmol/L (ref 22–32)
Calcium: 9.4 mg/dL (ref 8.9–10.3)
Chloride: 99 mmol/L (ref 98–111)
Creatinine, Ser: 1.21 mg/dL (ref 0.61–1.24)
GFR, Estimated: 59 mL/min — ABNORMAL LOW (ref >60)
Glucose, Bld: 140 mg/dL — ABNORMAL HIGH (ref 70–99)
Potassium: 4.5 mmol/L (ref 3.5–5.1)
Sodium: 136 mmol/L (ref 135–145)

## 2022-11-05 LAB — LIPID PANEL
Cholesterol: 138 mg/dL (ref 0–200)
HDL: 61 mg/dL (ref >40)
LDL Cholesterol: 30 mg/dL (ref 0–99)
Total CHOL/HDL Ratio: 2.3 RATIO
Triglycerides: 235 mg/dL — ABNORMAL HIGH (ref <150)
VLDL: 47 mg/dL — ABNORMAL HIGH (ref 0–40)

## 2022-11-05 LAB — CBC
HCT: 44.6 % (ref 39.0–52.0)
Hemoglobin: 15.2 g/dL (ref 13.0–17.0)
MCH: 33.2 pg (ref 26.0–34.0)
MCHC: 34.1 g/dL (ref 30.0–36.0)
MCV: 97.4 fL (ref 80.0–100.0)
Platelets: 211 10*3/uL (ref 150–400)
RBC: 4.58 MIL/uL (ref 4.22–5.81)
RDW: 12.8 % (ref 11.5–15.5)
WBC: 6.7 10*3/uL (ref 4.0–10.5)
nRBC: 0 % (ref 0.0–0.2)

## 2022-11-05 LAB — BRAIN NATRIURETIC PEPTIDE: B Natriuretic Peptide: 109.7 pg/mL — ABNORMAL HIGH (ref 0.0–100.0)

## 2022-11-05 NOTE — Progress Notes (Signed)
t  Advanced Heart Failure Clinic Note    PCP: Carylon Perches, MD AHF: Dr. Shirlee Latch  Urology: Dr Vernie Ammons   HPI: 82 y.o. male w/  prior history of HTN and hyperlipidemia, who initially reported to [facility]  w/ complaints of about 1 month of cough and dyspnea with exertion as well as progressive edema. No chest pain. He saw his PCP prior to coming to the ED on 08/01/19 and was noted to be in atrial fibrillation with RVR and he was then sent to [facility] for admission. Hs-TnI was flat at 25 => 27.  BNP elevated, 1,068. COVID negative. He was initially tried on diltiazem gtt but became hypotensive and changed to amiodarone gtt.  Echo was done, showing EF < 20% with diffuse hypokinesis, severely decreased RV function. He was transferred to [facility] on 2/18 for HF work up and cath.    On arrival to [facility], he was found to be volume overloaded w/ soft SBPs and rising SCr up to 1.9 with attempts at diuresis, concerning for low output. PICC line was placed to measure co-ox and CVP. Initial co-ox low at 46%. He was started on milrinone and IV Lasix. IV amiodarone continued for afib + IV heparin. RHC/LHC 08/04/19 with no coronary disease, mildly elevated filling pressures. Co-ox improved w/ milrinone. He was ultimately weaned off milrinone and co-ox remained stable. He diuresed well w/ IV Lasix and later changed to torsemide. SCr improved w/ diuresis, down to 1.32.    He was set up for TEE/DCCV on 08/07/19 and found to have LAA thrombus on TEE. DCCV canceled. IV amiodarone was discontinued and he was changed to PO, 200 mg bid. Placed on Eliquis 5 bid. ASA discontinued. Plan to repeat TEE/DCCV after 1 month of chronic anticoagulation. He was advised to continue amiodarone 200 mg bid x 7 days, followed by 200 mg daily.    Also of note, he develop urinary retention due to BPH. Had over 500cc on bladder scan. Multiple attempts were made by the nursing staff to place a foley which were unsuccessful. Urology was consulted and placed 16  french council foley catheter. Subsequently, he had mild hematuria which ultimately cleared. Urology recommended foley to continue on discharge with plans to be followed closely in outpatient clinic. He was discharged from the hospital 08/09/19.   He had f/u w/ urology 08/15/19 and foley removed. After returning home, he redeveloped urinary retention and went to ED next day. Foley cath reinserted.   S/P DC-CV 09/08/19 with restoration of NSR.   He had TURP in 6/21.   Cardiac MRI in 6/21 showed EF 35%, LGE pattern consistent with lateral infarction.   He saw Dr. Elberta Fortis and atrial fibrillation ablation was recommended. S/P A fib ablation 02/2020. Zio patch was placed to assess PVC burden. Had 16% PVC burden.    Echo in 1/22 showed EF up to 50-55% with normal RV.  Zio patch was repeated in 1/22, now 8% PVCs.   Echo in 11/23 showed EF 50-55%, mild LVH, normal RV.   Patient returns for followup of CHF.  No complaints today.  Rare palpitations. Occasional lightheadedness if he stands too fast but not really bothersome.  No chest pain.  Not particularly active but no exertional dyspnea.  Does all ADLs without problems.  Larey Seat once a couple of months ago after tripping over a step.   Labs (3/21): K 4.7, creatinine 1.6, TSH elevated, free T3/free T4 normal, LFTs normal.  Labs (5/21): LFTs normal, digoxin 0.8, TSH  mildly elevated, free T3 and free T4 normal.  Labs (6/21): K 4.9 => 5.3, creatinine 1.77 => 1.65 Labs (02/01/20): K 5.1 Creatinine 1.5  Labs (10/21): K 4.6, creatinine 1.34 Labs (1/22): K 4.9, creatinine 1.5 Labs (7/22): LDL 36 Labs (8/22): K 4.8, creatinine 1.26 Labs (9/22): K 4.3, creatinine 1.34 Labs (4/23): LDL 31, TGs 174, K 4.3, creatinine 1.2, hgb 15.1 Labs (8/23): K 4.8, creatinine 1.06 Labs (3/24): K 4.2, creatinine 1.06  ECG:  NSR, low voltage (personally reviewed)  PMH: 1. Atrial fibrillation: Paroxysmal.   - DCCV to NSR 3/21.  - Atrial fibrillation ablation 9/21 2. BPH with  urinary retention - TURP 6/21 3. Type 2 diabetes 4. HTN 5. Nephrolithiasis 6. CKD stage 3 7. Chronic systolic CHF: Nonischemic cardiomyopathy.  - Echo (2/21): EF < 20%, with severely decreased RV systolic function. - LHC (2/21): No significant CAD - Cardiac MRI (6/21): EF 35%, LGE pattern consistent with lateral infarction.  - Echo (1/22): EF 50-55%, normal RV.  - Echo (11/23): EF 50-55%, mild LVH, normal RV. 8. PVCs: 8/21 Zio patch with 16% PVCs.  - Zio patch (1/22): 8% PVCs 9. ABIs (9/22): Normal   Current Outpatient Medications  Medication Sig Dispense Refill   apixaban (ELIQUIS) 5 MG TABS tablet Take 1 tablet (5 mg total) by mouth 2 (two) times daily. 60 tablet 11   atorvastatin (LIPITOR) 10 MG tablet Take 10 mg by mouth daily.     carvedilol (COREG) 12.5 MG tablet TAKE 1 TABLET BY MOUTH TWICE DAILY WITH MEALS. 180 tablet 3   empagliflozin (JARDIANCE) 25 MG TABS tablet Take 25 mg by mouth daily.     metFORMIN (GLUCOPHAGE) 500 MG tablet Take 1 tablet by mouth in the morning and at bedtime.     sacubitril-valsartan (ENTRESTO) 49-51 MG Take 1 tablet by mouth 2 (two) times daily. 60 tablet 11   spironolactone (ALDACTONE) 25 MG tablet TAKE 1 TABLET BY MOUTH  DAILY 90 tablet 3   traZODone (DESYREL) 50 MG tablet Take 50 mg by mouth at bedtime.     Multiple Vitamin (MULTIVITAMIN WITH MINERALS) TABS tablet Take 1 tablet by mouth daily.     No current facility-administered medications for this encounter.    No Known Allergies    Social History   Socioeconomic History   Marital status: Divorced    Spouse name: Not on file   Number of children: Not on file   Years of education: Not on file   Highest education level: Not on file  Occupational History   Not on file  Tobacco Use   Smoking status: Light Smoker    Types: Cigars   Smokeless tobacco: Current    Types: Chew   Tobacco comments:    rarely chews tobacco cigar  Vaping Use   Vaping Use: Never used  Substance and  Sexual Activity   Alcohol use: Yes    Alcohol/week: 1.0 standard drink of alcohol    Types: 1 Cans of beer per week   Drug use: No   Sexual activity: Not Currently  Other Topics Concern   Not on file  Social History Narrative   Not on file   Social Determinants of Health   Financial Resource Strain: Not on file  Food Insecurity: Not on file  Transportation Needs: Not on file  Physical Activity: Not on file  Stress: Not on file  Social Connections: Not on file  Intimate Partner Violence: Not on file      Family History  Problem Relation Age of Onset   Diabetes Mother    Hypertension Mother    Heart disease Mother    Diabetes Father    Hypertension Father    Heart disease Father    Stroke Father    Colon cancer Neg Hx     Vitals:   11/05/22 0935  BP: 110/68  Pulse: 71  SpO2: 97%  Weight: 90.7 kg (200 lb)   Wt Readings from Last 3 Encounters:  11/05/22 90.7 kg (200 lb)  10/15/22 89.8 kg (198 lb)  05/05/22 89.8 kg (198 lb)   PHYSICAL EXAM: General: NAD Neck: No JVD, no thyromegaly or thyroid nodule.  Lungs: Clear to auscultation bilaterally with normal respiratory effort. CV: Nondisplaced PMI.  Heart regular S1/S2, no S3/S4, no murmur.  No peripheral edema.  No carotid bruit.  Normal pedal pulses.  Abdomen: Soft, nontender, no hepatosplenomegaly, no distention.  Skin: Intact without lesions or rashes.  Neurologic: Alert and oriented x 3.  Psych: Normal affect. Extremities: No clubbing or cyanosis.  HEENT: Normal.    ASSESSMENT & PLAN: 1. Chronic systolic CHF: Echo 2/21 with EF <20%, severely decreased RV function. Nonischemic cardiomyopathy, ?tachycardia-mediated due to atrial fibrillation of uncertain duration.  He also has history of frequent PVCs (16% on 8/21 Zio patch).  He required milrinone during 2/21 admission but was titrated off.  Coronary angiography in 2/21 with no significant coronary disease. Cardiac MRI in 6/21 with EF up to 35%, LGE pattern  consistent with lateral MI (surprising given no significant CAD on cath).  Echo in 1/22 with EF improved to 50-55%, normal RV.  Echo in 11/23 showed EF still in the 50-55% range.  NYHA class I-II symptoms, not volume overloaded.  - Continue Coreg 12.5 mg bid.   - Continue spironolactone 25 mg daily, BMET today.  - Continue Entresto 49/51 bid.   - Continue empagliflozin with CKD and CHF 2. Atrial fibrillation: Paroxysmal.  Admitted with afib/RVR of uncertain duration 2/21.  Unsure if atrial fibrillation/RVR was cause of CMP (tachy-mediated) or an effect of the cardiomyopathy.  TEE on 2/22 showed LA appendage thrombus. He was anticoagulated, then underwent DC-CV on 09/08/19 with conversion to NSR. Atrial fibrillation ablation in 9/21.  He is in NSR today.  - He no longer takes amiodarone.   - Continue apixaban, CBC today. 3. CKD stage 3: BMET today. 4. BPH/urinary obstruction: Has had TURP.  5. PVCs: Zio patch 01/2020 with 16% PVCs.  He is now off amiodarone.  Repeat Zio patch with 8% PVCs in 1/22.  -  Continue Coreg 12.5 mg bid.   BMET in 3 months, see APP 6 months.   Marca Ancona, MD 11/05/22

## 2022-11-05 NOTE — Patient Instructions (Signed)
There has been no changes to your medications.  Labs done today, your results will be available in MyChart, we will contact you for abnormal readings.  Your physician recommends that you schedule a follow-up appointment in: 6 months ( November ) ** please call the office in August to arrange your follow up appointment. **  If you have any questions or concerns before your next appointment please send us a message through mychart or call our office at [phone].    TO LEAVE A MESSAGE FOR THE NURSE SELECT OPTION 2, PLEASE LEAVE A MESSAGE INCLUDING: YOUR NAME DATE OF BIRTH CALL BACK NUMBER REASON FOR CALL**this is important as we prioritize the call backs  YOU WILL RECEIVE A CALL BACK THE SAME DAY AS LONG AS YOU CALL BEFORE 4:00 PM  At the [facility], you and your health needs are our priority. As part of our continuing mission to provide you with exceptional heart care, we have created designated Provider Care Teams. These Care Teams include your primary Cardiologist (physician) and Advanced Practice Providers (APPs- Physician Assistants and Nurse Practitioners) who all work together to provide you with the care you need, when you need it.   You may see any of the following providers on your designated Care Team at your next follow up: Dr Daniel Bensimhon Dr Dalton McLean Dr. Aditya Sabharwal Amy Clegg, NP Brittainy Simmons, PA Jessica Milford,NP Lindsay Finch, PA Alma Diaz, NP Lauren Kemp, PharmD   Please be sure to bring in all your medications bottles to every appointment.    Thank you for choosing [facility]    

## 2022-12-03 DIAGNOSIS — I5020 Unspecified systolic (congestive) heart failure: Secondary | ICD-10-CM | POA: Diagnosis not present

## 2022-12-03 DIAGNOSIS — E1129 Type 2 diabetes mellitus with other diabetic kidney complication: Secondary | ICD-10-CM | POA: Diagnosis not present

## 2022-12-03 DIAGNOSIS — I480 Paroxysmal atrial fibrillation: Secondary | ICD-10-CM | POA: Diagnosis not present

## 2022-12-03 DIAGNOSIS — E875 Hyperkalemia: Secondary | ICD-10-CM | POA: Diagnosis not present

## 2022-12-29 ENCOUNTER — Emergency Department (HOSPITAL_COMMUNITY): Payer: No Typology Code available for payment source

## 2022-12-29 ENCOUNTER — Other Ambulatory Visit: Payer: Self-pay

## 2022-12-29 ENCOUNTER — Emergency Department (HOSPITAL_COMMUNITY)
Admission: EM | Admit: 2022-12-29 | Discharge: 2022-12-29 | Disposition: A | Discharge status: Home / Self Care | Payer: No Typology Code available for payment source | Attending: Emergency Medicine | Admitting: Emergency Medicine

## 2022-12-29 DIAGNOSIS — Z7901 Long term (current) use of anticoagulants: Secondary | ICD-10-CM | POA: Diagnosis not present

## 2022-12-29 DIAGNOSIS — R319 Hematuria, unspecified: Secondary | ICD-10-CM | POA: Diagnosis present

## 2022-12-29 DIAGNOSIS — R310 Gross hematuria: Secondary | ICD-10-CM | POA: Insufficient documentation

## 2022-12-29 DIAGNOSIS — R109 Unspecified abdominal pain: Secondary | ICD-10-CM | POA: Diagnosis not present

## 2022-12-29 DIAGNOSIS — N200 Calculus of kidney: Secondary | ICD-10-CM | POA: Diagnosis not present

## 2022-12-29 LAB — CBC WITH DIFFERENTIAL/PLATELET
Abs Immature Granulocytes: 0.01 10*3/uL (ref 0.00–0.07)
Basophils Absolute: 0 10*3/uL (ref 0.0–0.1)
Basophils Relative: 1 %
Eosinophils Absolute: 0.1 10*3/uL (ref 0.0–0.5)
Eosinophils Relative: 2 %
HCT: 42.9 % (ref 39.0–52.0)
Hemoglobin: 14.5 g/dL (ref 13.0–17.0)
Immature Granulocytes: 0 %
Lymphocytes Relative: 29 %
Lymphs Abs: 1.8 10*3/uL (ref 0.7–4.0)
MCH: 33.7 pg (ref 26.0–34.0)
MCHC: 33.8 g/dL (ref 30.0–36.0)
MCV: 99.8 fL (ref 80.0–100.0)
Monocytes Absolute: 0.8 10*3/uL (ref 0.1–1.0)
Monocytes Relative: 13 %
Neutro Abs: 3.6 10*3/uL (ref 1.7–7.7)
Neutrophils Relative %: 55 %
Platelets: 154 10*3/uL (ref 150–400)
RBC: 4.3 MIL/uL (ref 4.22–5.81)
RDW: 12.3 % (ref 11.5–15.5)
WBC: 6.4 10*3/uL (ref 4.0–10.5)
nRBC: 0 % (ref 0.0–0.2)

## 2022-12-29 LAB — BASIC METABOLIC PANEL
Anion gap: 8 (ref 5–15)
BUN: 24 mg/dL — ABNORMAL HIGH (ref 8–23)
CO2: 23 mmol/L (ref 22–32)
Calcium: 8.7 mg/dL — ABNORMAL LOW (ref 8.9–10.3)
Chloride: 103 mmol/L (ref 98–111)
Creatinine, Ser: 1.22 mg/dL (ref 0.61–1.24)
GFR, Estimated: 58 mL/min — ABNORMAL LOW (ref >60)
Glucose, Bld: 147 mg/dL — ABNORMAL HIGH (ref 70–99)
Potassium: 3.9 mmol/L (ref 3.5–5.1)
Sodium: 134 mmol/L — ABNORMAL LOW (ref 135–145)

## 2022-12-29 LAB — URINALYSIS, W/ REFLEX TO CULTURE (INFECTION SUSPECTED)
Bacteria, UA: NONE SEEN
Bilirubin Urine: NEGATIVE
Glucose, UA: >=500 mg/dL — AB
Ketones, ur: NEGATIVE mg/dL
Leukocytes,Ua: NEGATIVE
Nitrite: NEGATIVE
Protein, ur: 100 mg/dL — AB
RBC / HPF: >50 RBC/hpf (ref 0–5)
Specific Gravity, Urine: 1.017 (ref 1.005–1.030)
pH: 6 (ref 5.0–8.0)

## 2022-12-29 MED ORDER — CEPHALEXIN 500 MG PO CAPS
500.0000 mg | ORAL_CAPSULE | Freq: Once | ORAL | Status: AC
  Administered 2022-12-29: 500 mg via ORAL
  Filled 2022-12-29: qty 1

## 2022-12-29 MED ORDER — CEPHALEXIN 500 MG PO CAPS: 500.0000 mg | ORAL_CAPSULE | Freq: Three times a day (TID) | ORAL | 0 refills | Status: DC

## 2022-12-29 NOTE — ED Triage Notes (Signed)
Pt states he noticed some blood in his urine on Friday and has gotten worse since. Reports tonight his urine looked like bright red blood with small clots. Denies any dysuria.

## 2022-12-29 NOTE — ED Provider Notes (Signed)
[place] EMERGENCY DEPARTMENT AT [facility] Provider Note   CSN: [id number] Arrival date & time: 12/29/22  0055     History  Chief Complaint  Patient presents with   Hematuria    [facility] Guerrero is a 82 y.o. male.  Presents to the emerged department for evaluation of blood in his urine.  He has been seeing small amounts of blood in his urine for the last couple of days.  Tonight he started passing pure blood.  No urinary retention.  He reports a similar episode about a year ago that was cleared up with antibiotics.  He does take Eliquis.       Home Medications Prior to Admission medications   Medication Sig Start Date End Date Taking? Authorizing Provider  cephALEXin (KEFLEX) 500 MG capsule Take 1 capsule (500 mg total) by mouth 3 (three) times daily. 12/29/22  Yes Poppy Mcafee, Canary Brim, MD  apixaban (ELIQUIS) 5 MG TABS tablet Take 1 tablet (5 mg total) by mouth 2 (two) times daily. 07/08/20   Laurey Morale, MD  atorvastatin (LIPITOR) 10 MG tablet Take 10 mg by mouth daily.    [provider]  carvedilol (COREG) 12.5 MG tablet TAKE 1 TABLET BY MOUTH TWICE DAILY WITH MEALS. 03/13/22   Laurey Morale, MD  empagliflozin (JARDIANCE) 25 MG TABS tablet Take 25 mg by mouth daily.    [provider]  metFORMIN (GLUCOPHAGE) 500 MG tablet Take 1 tablet by mouth in the morning and at bedtime. 04/24/20   [provider]  Multiple Vitamin (MULTIVITAMIN WITH MINERALS) TABS tablet Take 1 tablet by mouth daily.    [provider]  sacubitril-valsartan (ENTRESTO) 49-51 MG Take 1 tablet by mouth 2 (two) times daily. 09/17/20   Laurey Morale, MD  spironolactone (ALDACTONE) 25 MG tablet TAKE 1 TABLET BY MOUTH  DAILY 12/05/21   Laurey Morale, MD  traZODone (DESYREL) 50 MG tablet Take 50 mg by mouth at bedtime.    [provider]      Allergies    Patient has no known allergies.    Review of Systems   Review of Systems  Physical  Exam Updated Vital Signs BP 113/74   Pulse 66   Temp 97.9 F (36.6 C)   Resp 18   Ht 5\' 9"  (1.753 m)   Wt 90.3 kg   SpO2 97%   BMI 29.39 kg/m  Physical Exam Vitals and nursing note reviewed.  Constitutional:      General: He is not in acute distress.    Appearance: He is well-developed.  HENT:     Head: Normocephalic and atraumatic.     Mouth/Throat:     Mouth: Mucous membranes are moist.  Eyes:     General: Vision grossly intact. Gaze aligned appropriately.     Extraocular Movements: Extraocular movements intact.     Conjunctiva/sclera: Conjunctivae normal.  Cardiovascular:     Rate and Rhythm: Normal rate and regular rhythm.     Pulses: Normal pulses.     Heart sounds: Normal heart sounds, S1 normal and S2 normal. No murmur heard.    No friction rub. No gallop.  Pulmonary:     Effort: Pulmonary effort is normal. No respiratory distress.     Breath sounds: Normal breath sounds.  Abdominal:     Palpations: Abdomen is soft.     Tenderness: There is no abdominal tenderness. There is no guarding or rebound.     Hernia: No hernia  is present.  Musculoskeletal:        General: No swelling.     Cervical back: Full passive range of motion without pain, normal range of motion and neck supple. No pain with movement, spinous process tenderness or muscular tenderness. Normal range of motion.     Right lower leg: No edema.     Left lower leg: No edema.  Skin:    General: Skin is warm and dry.     Capillary Refill: Capillary refill takes less than 2 seconds.     Findings: No ecchymosis, erythema, lesion or wound.  Neurological:     Mental Status: He is alert and oriented to person, place, and time.     GCS: GCS eye subscore is 4. GCS verbal subscore is 5. GCS motor subscore is 6.     Cranial Nerves: Cranial nerves 2-12 are intact.     Sensory: Sensation is intact.     Motor: Motor function is intact. No weakness or abnormal muscle tone.     Coordination: Coordination is intact.   Psychiatric:        Mood and Affect: Mood normal.        Speech: Speech normal.        Behavior: Behavior normal.     ED Results / Procedures / Treatments   Labs (all labs ordered are listed, but only abnormal results are displayed) Labs Reviewed  URINALYSIS, W/ REFLEX TO CULTURE (INFECTION SUSPECTED) - Abnormal; Notable for the following components:      Result Value   Color, Urine RED (*)    APPearance HAZY (*)    Glucose, UA >=500 (*)    Hgb urine dipstick MODERATE (*)    Protein, ur 100 (*)    All other components within normal limits  BASIC METABOLIC PANEL - Abnormal; Notable for the following components:   Sodium 134 (*)    Glucose, Bld 147 (*)    BUN 24 (*)    Calcium 8.7 (*)    GFR, Estimated 58 (*)    All other components within normal limits  CBC WITH DIFFERENTIAL/PLATELET    EKG None  Radiology CT RENAL STONE STUDY  Result Date: 12/29/2022 CLINICAL DATA:  Abdominal and flank pain EXAM: CT ABDOMEN AND PELVIS WITHOUT CONTRAST TECHNIQUE: Multidetector CT imaging of the abdomen and pelvis was performed following the standard protocol without IV contrast. RADIATION DOSE REDUCTION: This exam was performed according to the departmental dose-optimization program which includes automated exposure control, adjustment of the mA and/or kV according to patient size and/or use of iterative reconstruction technique. COMPARISON:  None Available. FINDINGS: Lower Chest: 5 mm right basilar pulmonary nodule (series 4, image 44). Hepatobiliary: Normal hepatic contours. No intra- or extrahepatic biliary dilatation. Status post cholecystectomy. Pancreas: Normal pancreas. No ductal dilatation or peripancreatic fluid collection. Spleen: Normal. Adrenals/Urinary Tract: The adrenal glands are normal. 4 mm nonobstructing left renal calculus. No solid renal mass. No hydroureteronephrosis. The urinary bladder is normal for degree of distention Stomach/Bowel: There is no hiatal hernia. Normal  duodenal course and caliber. No small bowel dilatation or inflammation. No focal colonic abnormality. Normal appendix. Vascular/Lymphatic: There is calcific atherosclerosis of the abdominal aorta. No lymphadenopathy. Reproductive: Enlarged prostate measures 5.4 cm in transverse dimension. Other: None. Musculoskeletal: No bony spinal canal stenosis or focal osseous abnormality. IMPRESSION: 1. No acute abnormality of the abdomen or pelvis. 2. A 4 mm nonobstructing left renal calculus. 3. Right solid pulmonary nodule measuring 5 mm. Per Fleischner Society Guidelines, no  routine follow-up imaging is recommended. These guidelines do not apply to immunocompromised patients and patients with cancer. Follow up in patients with significant comorbidities as clinically warranted. For lung cancer screening, adhere to Lung-RADS guidelines. Reference: Radiology. 2017; 284(1):228-43. Aortic Atherosclerosis (ICD10-I70.0). Electronically Signed   By: Deatra Robinson M.D.   On: 12/29/2022 02:35    Procedures Procedures    Medications Ordered in ED Medications  cephALEXin (KEFLEX) capsule 500 mg (has no administration in time range)    ED Course/ Medical Decision Making/ A&P                             Medical Decision Making Amount and/or Complexity of Data Reviewed Labs: ordered. Radiology: ordered.  Risk Prescription drug management.   Differential diagnosis considered includes, but not limited to: Hemorrhagic cystitis; prostatitis; ureterolithiasis; urologic tumor  Presents for evaluation of gross hematuria.  Patient has been seeing some intermittent episodes of urine for the last couple of days but it got worse tonight.  He is spontaneously passing his urine.  No evidence of urinary retention.  Lab work reassuring.  CT scan without significant findings.  Patient on Eliquis secondary to history of atrial fibrillation, sinus rhythm today.  History of ablation, felt to be low risk to stop Eliquis for a  couple of days.  Will treat empirically for infection.  Culture pending.  Follow-up with urology.  Given instructions to return to the ED for urinary retention.        Final Clinical Impression(s) / ED Diagnoses Final diagnoses:  Gross hematuria    Rx / DC Orders ED Discharge Orders          Ordered    cephALEXin (KEFLEX) 500 MG capsule  3 times daily        12/29/22 0329              Gilda Crease, MD 12/29/22 [phone]

## 2022-12-29 NOTE — Discharge Instructions (Signed)
Do not take the Eliquis for the next 2 days.  Restarted after that.  Take the antibiotics as prescribed.  Call urology today to schedule an appointment in the office.  If you find that you cannot pass your urine, return to the ED to be evaluated for blockage and clots.

## 2022-12-30 DIAGNOSIS — R7309 Other abnormal glucose: Secondary | ICD-10-CM | POA: Diagnosis not present

## 2022-12-30 DIAGNOSIS — E1122 Type 2 diabetes mellitus with diabetic chronic kidney disease: Secondary | ICD-10-CM | POA: Diagnosis not present

## 2022-12-30 DIAGNOSIS — I5030 Unspecified diastolic (congestive) heart failure: Secondary | ICD-10-CM | POA: Diagnosis not present

## 2022-12-30 DIAGNOSIS — E875 Hyperkalemia: Secondary | ICD-10-CM | POA: Diagnosis not present

## 2022-12-30 DIAGNOSIS — I480 Paroxysmal atrial fibrillation: Secondary | ICD-10-CM | POA: Diagnosis not present

## 2023-01-08 ENCOUNTER — Telehealth: Payer: Self-pay | Admitting: *Deleted

## 2023-01-08 NOTE — Telephone Encounter (Signed)
Transition Care Management Unsuccessful Follow-up Telephone Call  Date of discharge and from where:  [facility] 12/29/2022  Attempts:  1st Attempt  Reason for unsuccessful TCM follow-up call:  No answer/busy

## 2023-01-11 ENCOUNTER — Telehealth: Payer: Self-pay | Admitting: *Deleted

## 2023-01-11 NOTE — Telephone Encounter (Signed)
Transition Care Management Unsuccessful Follow-up Telephone Call  Date of discharge and from where:  [facility]  Attempts:  2nd Attempt  Reason for unsuccessful TCM follow-up call:  No answer/busy

## 2023-02-01 DIAGNOSIS — E65 Localized adiposity: Secondary | ICD-10-CM | POA: Insufficient documentation

## 2023-02-01 DIAGNOSIS — N529 Male erectile dysfunction, unspecified: Secondary | ICD-10-CM | POA: Insufficient documentation

## 2023-02-01 DIAGNOSIS — Z87442 Personal history of urinary calculi: Secondary | ICD-10-CM | POA: Insufficient documentation

## 2023-02-01 DIAGNOSIS — E119 Type 2 diabetes mellitus without complications: Secondary | ICD-10-CM | POA: Insufficient documentation

## 2023-02-01 DIAGNOSIS — Z7729 Contact with and (suspected ) exposure to other hazardous substances: Secondary | ICD-10-CM | POA: Insufficient documentation

## 2023-02-01 NOTE — Progress Notes (Unsigned)
Name: Chris Guerrero DOB: 1940/08/16 MRN: [id number]  History of Present Illness: Chris Guerrero is a 82 y.o. male who presents today for follow up visit at [facility] [place]. - GU history: 1. Microscopic hematuria.  2. BPH with nocturia x1. Underwent TURP by Dr. Vernie Ammons in June 2021.  Cystoscopy at that time demonstrated no bladder lesions. 3. Distant history of kidney stones.  4. Prior UTIs. 5. Erectile dysfunction.  At last visit with Dr. Annabell Howells on 10/15/2022: Doing well.  Since last visit: 12/29/2022: Seen in ER for gross hematuria.  - Normal CBC.  - No acute GU findings on BMP (creatinine 1.22, GFR 58). - UA positive for >50 RBC/hpf; negative for leukocytes or nitrites. - CT stone study showed a 4 mm non-obstructing left kidney stone and prostatomegaly; no masses or hydronephrosis. - Treated with Keflex for possible UTI; no urine culture done.  Today: He presents for further evaluation of the gross hematuria episode last month; he states it lasted for 1 day and has not recurred since. He denies increased urinary urgency, frequency, nocturia, dysuria, hesitancy, straining to void, or sensations of incomplete emptying. He denies abdominal or flank pain.   He denies history of GU malignancy or pelvic radiation.  He denies history of autoimmune disease. He reports history of smoking (occasionally chews tobacco via cigars but doesn't smoke them). He denies known occupational risks. He denies recent vigorous exercise which they think may be contributory to hematuria. He denies any recent trauma or prolonged pressure to the perineal area. He denies recent illness. He reports taking anticoagulants (Eliquis).   Fall Screening: Do you usually have a device to assist in your mobility? No   Medications: Current Outpatient Medications  Medication Sig Dispense Refill   apixaban (ELIQUIS) 5 MG TABS tablet Take 1 tablet (5 mg total) by mouth 2 (two) times daily. 60 tablet 11    atorvastatin (LIPITOR) 10 MG tablet Take 10 mg by mouth daily.     carvedilol (COREG) 12.5 MG tablet TAKE 1 TABLET BY MOUTH TWICE DAILY WITH MEALS. 180 tablet 3   cephALEXin (KEFLEX) 500 MG capsule Take 1 capsule (500 mg total) by mouth 3 (three) times daily. 21 capsule 0   empagliflozin (JARDIANCE) 25 MG TABS tablet Take 25 mg by mouth daily.     metFORMIN (GLUCOPHAGE) 500 MG tablet Take 1 tablet by mouth in the morning and at bedtime.     Multiple Vitamin (MULTIVITAMIN WITH MINERALS) TABS tablet Take 1 tablet by mouth daily.     sacubitril-valsartan (ENTRESTO) 49-51 MG Take 1 tablet by mouth 2 (two) times daily. 60 tablet 11   spironolactone (ALDACTONE) 25 MG tablet TAKE 1 TABLET BY MOUTH  DAILY 90 tablet 3   traZODone (DESYREL) 50 MG tablet Take 50 mg by mouth at bedtime.     No current facility-administered medications for this visit.    Allergies: No Known Allergies  Past Medical History:  Diagnosis Date   Atrial fibrillation (HCC)    BPH with obstruction/lower urinary tract symptoms    CHF (congestive heart failure) (HCC)    CKD (chronic kidney disease), stage III (HCC)    Diabetes mellitus without complication (HCC)    History of kidney stones    Hyperlipemia    Hypertension    Nonischemic cardiomyopathy (HCC)    Past Surgical History:  Procedure Laterality Date   ATRIAL FIBRILLATION ABLATION N/A 02/07/2020   Procedure: ATRIAL FIBRILLATION ABLATION;  Surgeon: Regan Lemming, MD;  Location: [facility]  INVASIVE CV LAB;  Service: Cardiovascular;  Laterality: N/A;   CARDIOVERSION N/A 09/08/2019   Procedure: CARDIOVERSION;  Surgeon: Laurey Morale, MD;  Location: [facility] ENDOSCOPY;  Service: Cardiovascular;  Laterality: N/A;   CATARACT EXTRACTION W/ INTRAOCULAR LENS IMPLANT     CHOLECYSTECTOMY     COLONOSCOPY  09/04/2010   RMR: diverticula, cecal polyp = tubular adenoma; Repeat in 5 years   COLONOSCOPY N/A 10/10/2015   Procedure: COLONOSCOPY;  Surgeon: Corbin Ade, MD;  [facility] END[facility];  Service: Endoscopy;  Laterality: N/A;  1300   HERNIA REPAIR     RIGHT/LEFT HEART CATH AND CORONARY ANGIOGRAPHY N/A 08/04/2019   Procedure: RIGHT/LEFT HEART CATH AND CORONARY ANGIOGRAPHY;  Surgeon: Laurey Morale, MD;  Location: [facility] INVASIVE CV LAB;  Service: Cardiovascular;  Laterality: N/A;   TEE WITHOUT CARDIOVERSION N/A 08/07/2019   Procedure: TRANSESOPHAGEAL ECHOCARDIOGRAM (TEE);  Surgeon: Laurey Morale, MD;  Location: [facility] ENDOSCOPY;  Service: Cardiovascular;  Laterality: N/A;   TEE WITHOUT CARDIOVERSION N/A 09/08/2019   Procedure: TRANSESOPHAGEAL ECHOCARDIOGRAM (TEE);  Surgeon: Laurey Morale, MD;  Location: [facility] ENDOSCOPY;  Service: Cardiovascular;  Laterality: N/A;   TRANSURETHRAL RESECTION OF PROSTATE N/A 11/27/2019   Procedure: TRANSURETHRAL RESECTION OF THE PROSTATE (TURP);  Surgeon: Ihor Gully, MD;  Location: [facility] ORS;  Service: Urology;  Laterality: N/A;   Family History  Problem Relation Age of Onset   Diabetes Mother    Hypertension Mother    Heart disease Mother    Diabetes Father    Hypertension Father    Heart disease Father    Stroke Father    Colon cancer Neg Hx    Social History   Socioeconomic History   Marital status: Divorced    Spouse name: Not on file   Number of children: Not on file   Years of education: Not on file   Highest education level: Not on file  Occupational History   Not on file  Tobacco Use   Smoking status: Light Smoker    Types: Cigars   Smokeless tobacco: Current    Types: Chew   Tobacco comments:    rarely chews tobacco cigar  Vaping Use   Vaping status: Never Used  Substance and Sexual Activity   Alcohol use: Yes    Alcohol/week: 1.0 standard drink of alcohol    Types: 1 Cans of beer per week   Drug use: No   Sexual activity: Not Currently  Other Topics Concern   Not on file  Social History Narrative   Not on file   Social Determinants of Health   Financial Resource Strain: Not on file  Food Insecurity:  Not on file  Transportation Needs: Not on file  Physical Activity: Not on file  Stress: Not on file  Social Connections: Not on file  Intimate Partner Violence: Not on file    Review of Systems Constitutional: Patient denies any unintentional weight loss or change in strength lntegumentary: Patient denies any rashes or pruritus Cardiovascular: Patient denies chest pain or syncope Respiratory: Patient denies shortness of breath Gastrointestinal: Patient denies nausea, vomiting, constipation, or diarrhea Musculoskeletal: Patient denies muscle cramps or weakness Neurologic: Patient denies convulsions or seizures Psychiatric: Patient denies memory problems Allergic/Immunologic: Patient denies recent allergic reaction(s) Hematologic/Lymphatic: Patient denies bleeding tendencies Endocrine: Patient denies heat/cold intolerance  GU: As per HPI.  OBJECTIVE Vitals:   02/02/23 0836  BP: 100/65  Pulse: 71  Temp: 97.6 F (36.4 C)   There is no height or weight  on file to calculate BMI.  Physical Examination  Constitutional: No obvious distress; patient is non-toxic appearing  Cardiovascular: No visible lower extremity edema.  Respiratory: The patient does not have audible wheezing/stridor; respirations do not appear labored  Gastrointestinal: Abdomen non-distended Musculoskeletal: Normal ROM of UEs  Skin: No obvious rashes/open sores  Neurologic: CN 2-12 grossly intact Psychiatric: Answered questions appropriately with normal affect  Hematologic/Lymphatic/Immunologic: No obvious bruises or sites of spontaneous bleeding  UA: no evidence of UTI or microscopic hematuria PVR: 10 ml  ASSESSMENT Gross hematuria - Plan: Urinalysis, Routine w reflex microscopic, Cytology, urine  Microscopic hematuria  Exposure to potentially hazardous substance  BPH with urinary obstruction - Plan: BLADDER SCAN AMB NON-IMAGING  History of kidney stones  For management of gross hematuria, we  discussed possible etiologies including but not limited to: vigorous exercise, sexual activity, stone, trauma, blood thinner use, urinary tract infection, kidney function, BPH, prostatitis, malignancy. We discussed pt's nicotine use as a risk factor for GU cancer.  We discussed the importance of work-up including assessing the upper and lower GU tract with CT urogram and cystoscopy. We will also check voided cytology.  We discussed the risk for clot retention and pt was advised to increase fluid intake to thin out clots. Pt was advised to go to the ER if they become unable to urinate due to clot retention, start having symptoms of anemia (which were discussed), or any other significant concerning acute symptoms.  Pt verbalized understanding and decided to pursue this work-up. Patient agreed to follow-up afterward to discuss the results and formulate a treatment plan based on the findings. All questions were answered.   PLAN Advised the following: 1. Voided cytology. 2. Return for 1st available cystoscopy with Dr. Annabell Howells.  Orders Placed This Encounter  Procedures   Urinalysis, Routine w reflex microscopic   BLADDER SCAN AMB NON-IMAGING    It has been explained that the patient is to follow regularly with their PCP in addition to all other providers involved in their care and to follow instructions provided by these respective offices. Patient advised to contact urology clinic if any urologic-pertaining questions, concerns, new symptoms or problems arise in the interim period.  There are no Patient Instructions on file for this visit.  Electronically signed by:  Donnita Falls, FNP   02/02/23    9:31 AM

## 2023-02-02 ENCOUNTER — Ambulatory Visit: Payer: Medicare Other | Admitting: Urology

## 2023-02-02 ENCOUNTER — Encounter: Payer: Self-pay | Admitting: Urology

## 2023-02-02 VITALS — BP 100/65 | HR 71 | Temp 97.6°F

## 2023-02-02 DIAGNOSIS — Z87442 Personal history of urinary calculi: Secondary | ICD-10-CM | POA: Diagnosis not present

## 2023-02-02 DIAGNOSIS — Z7729 Contact with and (suspected ) exposure to other hazardous substances: Secondary | ICD-10-CM

## 2023-02-02 DIAGNOSIS — N401 Enlarged prostate with lower urinary tract symptoms: Secondary | ICD-10-CM | POA: Diagnosis not present

## 2023-02-02 DIAGNOSIS — N138 Other obstructive and reflux uropathy: Secondary | ICD-10-CM | POA: Diagnosis not present

## 2023-02-02 DIAGNOSIS — R310 Gross hematuria: Secondary | ICD-10-CM

## 2023-02-02 DIAGNOSIS — R3129 Other microscopic hematuria: Secondary | ICD-10-CM | POA: Diagnosis not present

## 2023-02-02 LAB — MICROSCOPIC EXAMINATION
Bacteria, UA: NONE SEEN
WBC, UA: NONE SEEN /hpf (ref 0–5)

## 2023-02-02 LAB — URINALYSIS, ROUTINE W REFLEX MICROSCOPIC
Bilirubin, UA: NEGATIVE
Ketones, UA: NEGATIVE
Leukocytes,UA: NEGATIVE
Nitrite, UA: NEGATIVE
Specific Gravity, UA: 1.025 (ref 1.005–1.030)
Urobilinogen, Ur: 0.2 mg/dL (ref 0.2–1.0)
pH, UA: 6 (ref 5.0–7.5)

## 2023-02-02 LAB — BLADDER SCAN AMB NON-IMAGING: Scan Result: 10

## 2023-02-02 NOTE — Progress Notes (Signed)
post void residual=10 

## 2023-02-03 LAB — CYTOLOGY, URINE

## 2023-03-04 ENCOUNTER — Other Ambulatory Visit (HOSPITAL_COMMUNITY): Payer: Self-pay | Admitting: Cardiology

## 2023-03-18 ENCOUNTER — Ambulatory Visit: Payer: Medicare Other | Admitting: Urology

## 2023-03-18 ENCOUNTER — Encounter: Payer: Self-pay | Admitting: Urology

## 2023-03-18 VITALS — BP 116/72 | HR 75 | Ht 69.0 in | Wt 199.0 lb

## 2023-03-18 DIAGNOSIS — N200 Calculus of kidney: Secondary | ICD-10-CM

## 2023-03-18 DIAGNOSIS — R310 Gross hematuria: Secondary | ICD-10-CM | POA: Diagnosis not present

## 2023-03-18 LAB — MICROSCOPIC EXAMINATION: RBC, Urine: NONE SEEN /[HPF] (ref 0–2)

## 2023-03-18 LAB — URINALYSIS, ROUTINE W REFLEX MICROSCOPIC
Bilirubin, UA: NEGATIVE
Ketones, UA: NEGATIVE
Leukocytes,UA: NEGATIVE
Nitrite, UA: POSITIVE — AB
Protein,UA: NEGATIVE
RBC, UA: NEGATIVE
Specific Gravity, UA: 1.015 (ref 1.005–1.030)
Urobilinogen, Ur: 0.2 mg/dL (ref 0.2–1.0)
pH, UA: 7.5 (ref 5.0–7.5)

## 2023-03-18 MED ORDER — CIPROFLOXACIN HCL 500 MG PO TABS
500.0000 mg | ORAL_TABLET | Freq: Once | ORAL | Status: AC
Start: 2023-03-18 — End: 2023-03-18
  Administered 2023-03-18: 500 mg via ORAL

## 2023-03-18 NOTE — Progress Notes (Signed)
Name: Chris Guerrero DOB: [birth year] MRN: [id number]  History of Present Illness: Mr. Chris Guerrero is a 82 y.o. male who presents today for follow up visit at [facility] [place]. - GU history: 1. Microscopic hematuria.  2. BPH with nocturia x1. Underwent TURP by Dr. Vernie Ammons in June 2021.  Cystoscopy at that time demonstrated no bladder lesions. 3. Distant history of kidney stones.  4. Prior UTIs. 5. Erectile dysfunction.  At last visit with Dr. Annabell Howells on 10/15/2022: Doing well.  Since last visit: 12/29/2022: Seen in ER for gross hematuria.  - Normal CBC.  - No acute GU findings on BMP (creatinine 1.22, GFR 58). - UA positive for >50 RBC/hpf; negative for leukocytes or nitrites. - CT stone study showed a 4 mm non-obstructing left kidney stone and prostatomegaly; no masses or hydronephrosis. - Treated with Keflex for possible UTI; no urine culture done.  Today: He returns today for cystoscopy to complete  his hematuria w/u.   A cytology was negative.    Fall Screening: Do you usually have a device to assist in your mobility? No   Medications: Current Outpatient Medications  Medication Sig Dispense Refill   apixaban (ELIQUIS) 5 MG TABS tablet Take 1 tablet (5 mg total) by mouth 2 (two) times daily. 60 tablet 11   atorvastatin (LIPITOR) 10 MG tablet Take 10 mg by mouth daily.     carvedilol (COREG) 12.5 MG tablet TAKE 1 TABLET BY MOUTH TWICE DAILY WITH MEALS. 180 tablet 1   cephALEXin (KEFLEX) 500 MG capsule Take 1 capsule (500 mg total) by mouth 3 (three) times daily. 21 capsule 0   empagliflozin (JARDIANCE) 25 MG TABS tablet Take 25 mg by mouth daily.     metFORMIN (GLUCOPHAGE) 500 MG tablet Take 1 tablet by mouth in the morning and at bedtime.     Multiple Vitamin (MULTIVITAMIN WITH MINERALS) TABS tablet Take 1 tablet by mouth daily.     sacubitril-valsartan (ENTRESTO) 49-51 MG Take 1 tablet by mouth 2 (two) times daily. 60 tablet 11   spironolactone (ALDACTONE) 25 MG  tablet TAKE 1 TABLET BY MOUTH  DAILY 90 tablet 3   traZODone (DESYREL) 50 MG tablet Take 50 mg by mouth at bedtime.     No current facility-administered medications for this visit.    Allergies: No Known Allergies  Past Medical History:  Diagnosis Date   Atrial fibrillation (HCC)    BPH with obstruction/lower urinary tract symptoms    CHF (congestive heart failure) (HCC)    CKD (chronic kidney disease), stage III (HCC)    Diabetes mellitus without complication (HCC)    History of kidney stones    Hyperlipemia    Hypertension    Nonischemic cardiomyopathy (HCC)    Past Surgical History:  Procedure Laterality Date   ATRIAL FIBRILLATION ABLATION N/A 02/07/2020   Procedure: ATRIAL FIBRILLATION ABLATION;  Surgeon: Regan Lemming, MD;  Location: [facility] INVASIVE CV LAB;  Service: Cardiovascular;  Laterality: N/A;   CARDIOVERSION N/A 09/08/2019   Procedure: CARDIOVERSION;  Surgeon: Laurey Morale, MD;  Location: [facility] ENDOSCOPY;  Service: Cardiovascular;  Laterality: N/A;   CATARACT EXTRACTION W/ INTRAOCULAR LENS IMPLANT     CHOLECYSTECTOMY     COLONOSCOPY  09/04/2010   RMR: diverticula, cecal polyp = tubular adenoma; Repeat in 5 years   COLONOSCOPY N/A 10/10/2015   Procedure: COLONOSCOPY;  Surgeon: Corbin Ade, MD;  [facility]: [facility] ENDO [facility];  Service: Endoscopy;  Laterality: N/A;  1300   HERNIA REPAIR  RIGHT/LEFT HEART CATH AND CORONARY ANGIOGRAPHY N/A 08/04/2019   Procedure: RIGHT/LEFT HEART CATH AND CORONARY ANGIOGRAPHY;  Surgeon: Laurey Morale, MD;  Location: [facility] INVASIVE CV LAB;  Service: Cardiovascular;  Laterality: N/A;   TEE WITHOUT CARDIOVERSION N/A 08/07/2019   Procedure: TRANSESOPHAGEAL ECHOCARDIOGRAM (TEE);  Surgeon: Laurey Morale, MD;  Location: [facility] ENDOSCOPY;  Service: Cardiovascular;  Laterality: N/A;   TEE WITHOUT CARDIOVERSION N/A 09/08/2019   Procedure: TRANSESOPHAGEAL ECHOCARDIOGRAM (TEE);  Surgeon: Laurey Morale, MD;  Location: [facility] ENDOSCOPY;  Service:  Cardiovascular;  Laterality: N/A;   TRANSURETHRAL RESECTION OF PROSTATE N/A 11/27/2019   Procedure: TRANSURETHRAL RESECTION OF THE PROSTATE (TURP);  Surgeon: Ihor Gully, MD;  Location: [facility] ORS;  Service: Urology;  Laterality: N/A;   Family History  Problem Relation Age of Onset   Diabetes Mother    Hypertension Mother    Heart disease Mother    Diabetes Father    Hypertension Father    Heart disease Father    Stroke Father    Colon cancer Neg Hx    Social History   Socioeconomic History   Marital status: Divorced    Spouse name: Not on file   Number of children: Not on file   Years of education: Not on file   Highest education level: Not on file  Occupational History   Not on file  Tobacco Use   Smoking status: Light Smoker    Types: Cigars   Smokeless tobacco: Current    Types: Chew   Tobacco comments:    rarely chews tobacco cigar  Vaping Use   Vaping status: Never Used  Substance and Sexual Activity   Alcohol use: Yes    Alcohol/week: 1.0 standard drink of alcohol    Types: 1 Cans of beer per week   Drug use: No   Sexual activity: Not Currently  Other Topics Concern   Not on file  Social History Narrative   Not on file   Social Determinants of Health   Financial Resource Strain: Not on file  Food Insecurity: Not on file  Transportation Needs: Not on file  Physical Activity: Not on file  Stress: Not on file  Social Connections: Not on file  Intimate Partner Violence: Not on file    Review of Systems Constitutional: Patient denies any unintentional weight loss or change in strength lntegumentary: Patient denies any rashes or pruritus Cardiovascular: Patient denies chest pain or syncope Respiratory: Patient denies shortness of breath Gastrointestinal: Patient denies nausea, vomiting, constipation, or diarrhea Musculoskeletal: Patient denies muscle cramps or weakness Neurologic: Patient denies convulsions or seizures Psychiatric: Patient denies memory  problems Allergic/Immunologic: Patient denies recent allergic reaction(s) Hematologic/Lymphatic: Patient denies bleeding tendencies Endocrine: Patient denies heat/cold intolerance  GU: As per HPI.  OBJECTIVE Vitals:   03/18/23 1027  BP: 116/72  Pulse: 75    Body mass index is 29.39 kg/m.  Physical Examination   Procedure: Flex Cystoscopy  He was prepped with betadine and 2% lidocaine jelly and given PO Cipro 500mg .  The scope was passed without difficulty.  The urethra is normal.  The external sphincter is intact.  The prostatic urethra is about 3-4cm with some right apical and mid regrowth with mild adhesions and proximal edema on the right.   The bladder has moderate trabeculation without mucosal lesions and the UO's are normal.  He had mild bleeding from disruption of the adhesions.   ASSESSMENT BPH with regrowth and hematuria.   I will just have him return in 6 months  and if he has recurrent hematuria, we can consider finasteride.  Renal stone.  No treatment needed.     PLAN  2. Return in about 6 months (around 09/16/2023).  Orders Placed This Encounter  Procedures   Microscopic Examination   Urinalysis, Routine w reflex microscopic   Cystoscopy     There are no Patient Instructions on file for this visit.  Electronically signed by:  Bjorn Pippin, MD   03/19/23    9:25 AM

## 2023-03-30 DIAGNOSIS — E1129 Type 2 diabetes mellitus with other diabetic kidney complication: Secondary | ICD-10-CM | POA: Diagnosis not present

## 2023-04-06 DIAGNOSIS — I480 Paroxysmal atrial fibrillation: Secondary | ICD-10-CM | POA: Diagnosis not present

## 2023-04-06 DIAGNOSIS — I5030 Unspecified diastolic (congestive) heart failure: Secondary | ICD-10-CM | POA: Diagnosis not present

## 2023-04-06 DIAGNOSIS — E1122 Type 2 diabetes mellitus with diabetic chronic kidney disease: Secondary | ICD-10-CM | POA: Diagnosis not present

## 2023-06-15 ENCOUNTER — Other Ambulatory Visit (HOSPITAL_COMMUNITY): Payer: Self-pay | Admitting: Cardiology

## 2023-07-05 ENCOUNTER — Other Ambulatory Visit (HOSPITAL_COMMUNITY): Payer: Self-pay | Admitting: Cardiology

## 2023-07-14 DIAGNOSIS — I480 Paroxysmal atrial fibrillation: Secondary | ICD-10-CM | POA: Diagnosis not present

## 2023-07-14 DIAGNOSIS — E1129 Type 2 diabetes mellitus with other diabetic kidney complication: Secondary | ICD-10-CM | POA: Diagnosis not present

## 2023-07-14 DIAGNOSIS — I5020 Unspecified systolic (congestive) heart failure: Secondary | ICD-10-CM | POA: Diagnosis not present

## 2023-07-14 DIAGNOSIS — Z79899 Other long term (current) drug therapy: Secondary | ICD-10-CM | POA: Diagnosis not present

## 2023-07-21 DIAGNOSIS — I5030 Unspecified diastolic (congestive) heart failure: Secondary | ICD-10-CM | POA: Diagnosis not present

## 2023-07-21 DIAGNOSIS — E875 Hyperkalemia: Secondary | ICD-10-CM | POA: Diagnosis not present

## 2023-07-21 DIAGNOSIS — E1122 Type 2 diabetes mellitus with diabetic chronic kidney disease: Secondary | ICD-10-CM | POA: Diagnosis not present

## 2023-07-21 DIAGNOSIS — S0190XA Unspecified open wound of unspecified part of head, initial encounter: Secondary | ICD-10-CM | POA: Diagnosis not present

## 2023-07-28 DIAGNOSIS — E875 Hyperkalemia: Secondary | ICD-10-CM | POA: Diagnosis not present

## 2023-08-12 ENCOUNTER — Other Ambulatory Visit (HOSPITAL_COMMUNITY): Payer: Self-pay | Admitting: Cardiology

## 2023-08-23 DIAGNOSIS — H26493 Other secondary cataract, bilateral: Secondary | ICD-10-CM | POA: Diagnosis not present

## 2023-08-23 DIAGNOSIS — Z961 Presence of intraocular lens: Secondary | ICD-10-CM | POA: Diagnosis not present

## 2023-08-23 DIAGNOSIS — E119 Type 2 diabetes mellitus without complications: Secondary | ICD-10-CM | POA: Diagnosis not present

## 2023-08-23 DIAGNOSIS — H35373 Puckering of macula, bilateral: Secondary | ICD-10-CM | POA: Diagnosis not present

## 2023-10-08 ENCOUNTER — Encounter (HOSPITAL_COMMUNITY): Payer: Self-pay | Admitting: Cardiology

## 2023-10-08 ENCOUNTER — Ambulatory Visit (HOSPITAL_COMMUNITY)
Admission: RE | Admit: 2023-10-08 | Discharge: 2023-10-08 | Disposition: A | Discharge status: Home / Self Care | Source: Ambulatory Visit | Attending: Cardiology | Admitting: Cardiology

## 2023-10-08 VITALS — BP 122/68 | HR 67 | Ht 69.0 in | Wt 202.4 lb

## 2023-10-08 DIAGNOSIS — I428 Other cardiomyopathies: Secondary | ICD-10-CM | POA: Insufficient documentation

## 2023-10-08 DIAGNOSIS — E785 Hyperlipidemia, unspecified: Secondary | ICD-10-CM | POA: Diagnosis not present

## 2023-10-08 DIAGNOSIS — E1122 Type 2 diabetes mellitus with diabetic chronic kidney disease: Secondary | ICD-10-CM | POA: Diagnosis not present

## 2023-10-08 DIAGNOSIS — N138 Other obstructive and reflux uropathy: Secondary | ICD-10-CM | POA: Insufficient documentation

## 2023-10-08 DIAGNOSIS — N183 Chronic kidney disease, stage 3 unspecified: Secondary | ICD-10-CM | POA: Insufficient documentation

## 2023-10-08 DIAGNOSIS — I5022 Chronic systolic (congestive) heart failure: Secondary | ICD-10-CM | POA: Diagnosis not present

## 2023-10-08 DIAGNOSIS — I5042 Chronic combined systolic (congestive) and diastolic (congestive) heart failure: Secondary | ICD-10-CM

## 2023-10-08 DIAGNOSIS — N401 Enlarged prostate with lower urinary tract symptoms: Secondary | ICD-10-CM | POA: Insufficient documentation

## 2023-10-08 DIAGNOSIS — I480 Paroxysmal atrial fibrillation: Secondary | ICD-10-CM | POA: Diagnosis not present

## 2023-10-08 DIAGNOSIS — I130 Hypertensive heart and chronic kidney disease with heart failure and stage 1 through stage 4 chronic kidney disease, or unspecified chronic kidney disease: Secondary | ICD-10-CM | POA: Diagnosis not present

## 2023-10-08 DIAGNOSIS — Z7984 Long term (current) use of oral hypoglycemic drugs: Secondary | ICD-10-CM | POA: Insufficient documentation

## 2023-10-08 DIAGNOSIS — Z7901 Long term (current) use of anticoagulants: Secondary | ICD-10-CM | POA: Diagnosis not present

## 2023-10-08 DIAGNOSIS — Z79899 Other long term (current) drug therapy: Secondary | ICD-10-CM | POA: Insufficient documentation

## 2023-10-08 DIAGNOSIS — E875 Hyperkalemia: Secondary | ICD-10-CM | POA: Insufficient documentation

## 2023-10-08 LAB — LIPID PANEL
Cholesterol: 154 mg/dL (ref 0–200)
HDL: 62 mg/dL (ref >40)
LDL Cholesterol: 40 mg/dL (ref 0–99)
Total CHOL/HDL Ratio: 2.5 ratio
Triglycerides: 262 mg/dL — ABNORMAL HIGH (ref <150)
VLDL: 52 mg/dL — ABNORMAL HIGH (ref 0–40)

## 2023-10-08 LAB — BASIC METABOLIC PANEL WITH GFR
Anion gap: 10 (ref 5–15)
BUN: 21 mg/dL (ref 8–23)
CO2: 26 mmol/L (ref 22–32)
Calcium: 9.5 mg/dL (ref 8.9–10.3)
Chloride: 102 mmol/L (ref 98–111)
Creatinine, Ser: 1.16 mg/dL (ref 0.61–1.24)
GFR, Estimated: >60 mL/min (ref >60)
Glucose, Bld: 135 mg/dL — ABNORMAL HIGH (ref 70–99)
Potassium: 4.3 mmol/L (ref 3.5–5.1)
Sodium: 138 mmol/L (ref 135–145)

## 2023-10-08 LAB — CBC
HCT: 47.3 % (ref 39.0–52.0)
Hemoglobin: 16.1 g/dL (ref 13.0–17.0)
MCH: 33.8 pg (ref 26.0–34.0)
MCHC: 34 g/dL (ref 30.0–36.0)
MCV: 99.2 fL (ref 80.0–100.0)
Platelets: 171 10*3/uL (ref 150–400)
RBC: 4.77 MIL/uL (ref 4.22–5.81)
RDW: 12 % (ref 11.5–15.5)
WBC: 6.5 10*3/uL (ref 4.0–10.5)
nRBC: 0 % (ref 0.0–0.2)

## 2023-10-08 LAB — BRAIN NATRIURETIC PEPTIDE: B Natriuretic Peptide: 233 pg/mL — ABNORMAL HIGH (ref 0.0–100.0)

## 2023-10-08 NOTE — Patient Instructions (Signed)
 There has been no changes to your medications.  Labs done today, your results will be available in MyChart, we will contact you for abnormal readings.  Your physician has requested that you have an echocardiogram. Echocardiography is a painless test that uses sound waves to create images of your heart. It provides your doctor with information about the size and shape of your heart and how well your heart's chambers and valves are working. This procedure takes approximately one hour. There are no restrictions for this procedure. Please do NOT wear cologne, perfume, aftershave, or lotions (deodorant is allowed). Please arrive 15 minutes prior to your appointment time.  Please note: We ask at that you not bring children with you during ultrasound (echo/ vascular) testing. Due to room size and safety concerns, children are not allowed in the ultrasound rooms during exams. Our front office staff cannot provide observation of children in our lobby area while testing is being conducted. An adult accompanying a patient to their appointment will only be allowed in the ultrasound room at the discretion of the ultrasound technician under special circumstances. We apologize for any inconvenience.  Your physician recommends that you schedule a follow-up appointment in: 6 months.  If you have any questions or concerns before your next appointment please send [place] a message through [employer] or call our office at [phone].    TO LEAVE A MESSAGE FOR THE NURSE SELECT OPTION 2, PLEASE LEAVE A MESSAGE INCLUDING: YOUR NAME DATE OF BIRTH CALL BACK NUMBER REASON FOR CALL**this is important as we prioritize the call backs  YOU WILL RECEIVE A CALL BACK THE SAME DAY AS LONG AS YOU CALL BEFORE 4:00 PM  At the [facility], you and your health needs are our priority. As part of our continuing mission to provide you with exceptional heart care, we have created designated Provider Care Teams. These Care  Teams include your primary Cardiologist (physician) and Advanced Practice Providers (APPs- Physician Assistants and Nurse Practitioners) who all work together to provide you with the care you need, when you need it.   You may see any of the following providers on your designated Care Team at your next follow up: Dr Arvilla Meres Dr Marca Ancona Dr. Dorthula Nettles Dr. Clearnce Hasten Amy Filbert Schilder, NP Robbie Lis, [place] [facility] [place], [place] Brynda Peon, NP [place] Lee, NP Clarisa Kindred, NP Karle Plumber, PharmD Enos Fling, PharmD   Please be sure to bring in all your medications bottles to every appointment.    Thank you for choosing [facility]

## 2023-10-10 NOTE — Progress Notes (Signed)
 t  Advanced Heart Failure Clinic Note    PCP: Artemisa Bile, MD AHF: Dr. Mitzie Anda  Urology: Dr Ottelin   Chief complaint: CHF  HPI: 83 y.o. male w/  prior history of HTN and hyperlipidemia, who initially reported to [facility]  w/ complaints of about 1 month of cough and dyspnea with exertion as well as progressive edema. No chest pain. He saw his PCP prior to coming to the ED on 08/01/19 and was noted to be in atrial fibrillation with RVR and he was then sent to [facility] for admission. Hs-TnI was flat at 25 => 27.  BNP elevated, 1,068. COVID negative. He was initially tried on diltiazem  gtt but became hypotensive and changed to amiodarone  gtt.  Echo was done, showing EF < 20% with diffuse hypokinesis, severely decreased RV function. He was transferred to [facility] on 2/18 for HF work up and cath.    On arrival to [facility], he was found to be volume overloaded w/ soft SBPs and rising SCr up to 1.9 with attempts at diuresis, concerning for low output. PICC line was placed to measure co-ox and CVP. Initial co-ox low at 46%. He was started on milrinone  and IV Lasix . IV amiodarone  continued for afib + IV heparin . RHC/LHC 08/04/19 with no coronary disease, mildly elevated filling pressures. Co-ox improved w/ milrinone . He was ultimately weaned off milrinone  and co-ox remained stable. He diuresed well w/ IV Lasix  and later changed to torsemide . SCr improved w/ diuresis, down to 1.32.    He was set up for TEE/DCCV on 08/07/19 and found to have LAA thrombus on TEE. DCCV canceled. IV amiodarone  was discontinued and he was changed to PO, 200 mg bid. Placed on Eliquis  5 bid. ASA discontinued. Plan to repeat TEE/DCCV after 1 month of chronic anticoagulation. He was advised to continue amiodarone  200 mg bid x 7 days, followed by 200 mg daily.    Also of note, he develop urinary retention due to BPH. Had over 500cc on bladder scan. Multiple attempts were made by the nursing staff to place a foley which were unsuccessful. Urology was  consulted and placed 16 french council foley catheter. Subsequently, he had mild hematuria which ultimately cleared. Urology recommended foley to continue on discharge with plans to be followed closely in outpatient clinic. He was discharged from the hospital 08/09/19.   He had f/u w/ urology 08/15/19 and foley removed. After returning home, he redeveloped urinary retention and went to ED next day. Foley cath reinserted.   S/P DC-CV 09/08/19 with restoration of NSR.   He had TURP in 6/21.   Cardiac MRI in 6/21 showed EF 35%, LGE pattern consistent with lateral infarction.   He saw Dr. Lawana Pray and atrial fibrillation ablation was recommended. S/P A fib ablation 02/2020. Zio patch was placed to assess PVC burden. Had 16% PVC burden.    Echo in 1/22 showed EF up to 50-55% with normal RV.  Zio patch was repeated in 1/22, now 8% PVCs.   Echo in 11/23 showed EF 50-55%, mild LVH, normal RV.   Patient returns for followup of CHF.  He has been doing well generally.  No exertional dyspnea or chest pain.  No palpitations.  He is in NSR today.  Weight up 2 lbs.  No orthopnea/PND.  PCP stopped his spironolactone  due to hyperkalemia.   Labs (3/21): K 4.7, creatinine 1.6, TSH elevated, free T3/free T4 normal, LFTs normal.  Labs (5/21): LFTs normal, digoxin  0.8, TSH mildly elevated, free T3 and free  T4 normal.  Labs (6/21): K 4.9 => 5.3, creatinine 1.77 => 1.65 Labs (02/01/20): K 5.1 Creatinine 1.5  Labs (10/21): K 4.6, creatinine 1.34 Labs (1/22): K 4.9, creatinine 1.5 Labs (7/22): LDL 36 Labs (8/22): K 4.8, creatinine 1.26 Labs (9/22): K 4.3, creatinine 1.34 Labs (4/23): LDL 31, TGs 174, K 4.3, creatinine 1.2, hgb 15.1 Labs (8/23): K 4.8, creatinine 1.06 Labs (3/24): K 4.2, creatinine 1.06 Labs (5/24): LDL 30 Labs (7/24): K 3.9, creatinine 1.22  ECG:  NSR, PAC/PVC (personally reviewed)  PMH: 1. Atrial fibrillation: Paroxysmal.   - DCCV to NSR 3/21.  - Atrial fibrillation ablation 9/21 2. BPH with  urinary retention - TURP 6/21 3. Type 2 diabetes 4. HTN 5. Nephrolithiasis 6. CKD stage 3 7. Chronic systolic CHF: Nonischemic cardiomyopathy.  - Echo (2/21): EF < 20%, with severely decreased RV systolic function. - LHC (2/21): No significant CAD - Cardiac MRI (6/21): EF 35%, LGE pattern consistent with lateral infarction.  - Echo (1/22): EF 50-55%, normal RV.  - Echo (11/23): EF 50-55%, mild LVH, normal RV. 8. PVCs: 8/21 Zio patch with 16% PVCs.  - Zio patch (1/22): 8% PVCs 9. ABIs (9/22): Normal   Current Outpatient Medications  Medication Sig Dispense Refill   apixaban  (ELIQUIS ) 5 MG TABS tablet Take 1 tablet (5 mg total) by mouth 2 (two) times daily. 60 tablet 11   atorvastatin  (LIPITOR) 10 MG tablet Take 10 mg by mouth daily.     carvedilol  (COREG ) 12.5 MG tablet Take 1 tablet (12.5 mg total) by mouth 2 (two) times daily with a meal. NEEDS FOLLOW UP APPOINTMENT FOR MORE REFILLS 180 tablet 0   empagliflozin (JARDIANCE) 25 MG TABS tablet Take 25 mg by mouth daily.     metFORMIN (GLUCOPHAGE) 500 MG tablet Take 1 tablet by mouth in the morning and at bedtime.     Multiple Vitamin (MULTIVITAMIN WITH MINERALS) TABS tablet Take 1 tablet by mouth daily.     sacubitril -valsartan  (ENTRESTO ) 49-51 MG Take 1 tablet by mouth 2 (two) times daily. 60 tablet 11   traZODone  (DESYREL ) 50 MG tablet Take 50 mg by mouth at bedtime.     No current facility-administered medications for this encounter.    No Known Allergies    Social History   Socioeconomic History   Marital status: Divorced    Spouse name: Not on file   Number of children: Not on file   Years of education: Not on file   Highest education level: Not on file  Occupational History   Not on file  Tobacco Use   Smoking status: Light Smoker    Types: Cigars   Smokeless tobacco: Current    Types: Chew   Tobacco comments:    rarely chews tobacco cigar  Vaping Use   Vaping status: Never Used  Substance and Sexual Activity    Alcohol use: Yes    Alcohol/week: 1.0 standard drink of alcohol    Types: 1 Cans of beer per week   Drug use: No   Sexual activity: Not Currently  Other Topics Concern   Not on file  Social History Narrative   Not on file   Social Drivers of Health   Financial Resource Strain: Not on file  Food Insecurity: Not on file  Transportation Needs: Not on file  Physical Activity: Not on file  Stress: Not on file  Social Connections: Not on file  Intimate Partner Violence: Not on file      Family History  Problem Relation Age of Onset   Diabetes Mother    Hypertension Mother    Heart disease Mother    Diabetes Father    Hypertension Father    Heart disease Father    Stroke Father    Colon cancer Neg Hx     Vitals:   10/08/23 0914  BP: 122/68  Pulse: 67  SpO2: 98%  Weight: 91.8 kg (202 lb 6.4 oz)  Height: 5\' 9"  (1.753 m)   Wt Readings from Last 3 Encounters:  10/08/23 91.8 kg (202 lb 6.4 oz)  03/18/23 90.3 kg (199 lb)  12/29/22 90.3 kg (199 lb)   PHYSICAL EXAM: General: NAD Neck: No JVD, no thyromegaly or thyroid  nodule.  Lungs: Clear to auscultation bilaterally with normal respiratory effort. CV: Nondisplaced PMI.  Heart regular S1/S2, no S3/S4, no murmur.  No peripheral edema.  No carotid bruit.  Normal pedal pulses.  Abdomen: Soft, nontender, no hepatosplenomegaly, no distention.  Skin: Intact without lesions or rashes.  Neurologic: Alert and oriented x 3.  Psych: Normal affect. Extremities: No clubbing or cyanosis.  HEENT: Normal.   ASSESSMENT & PLAN: 1. Chronic systolic CHF: Echo 2/21 with EF <20%, severely decreased RV function. Nonischemic cardiomyopathy, ?tachycardia-mediated due to atrial fibrillation of uncertain duration.  He also has history of frequent PVCs (16% on 8/21 Zio patch).  He required milrinone  during 2/21 admission but was titrated off.  Coronary angiography in 2/21 with no significant coronary disease. Cardiac MRI in 6/21 with EF up to  35%, LGE pattern consistent with lateral MI (surprising given no significant CAD on cath).  Echo in 1/22 with EF improved to 50-55%, normal RV.  Echo in 11/23 showed EF still in the 50-55% range.  NYHA class I-II, not volume overloaded on exam.  - Continue Coreg  12.5 mg bid.   - Off spironolactone  with hyperkalemia.  Will repeat echo, and if EF is still near normal, can stay off spironolactone .  - Continue Entresto  49/51 bid.  BMET/BNP today.  - Continue empagliflozin with CKD and CHF 2. Atrial fibrillation: Paroxysmal.  Admitted with afib/RVR of uncertain duration 2/21.  Unsure if atrial fibrillation/RVR was cause of CMP (tachy-mediated) or an effect of the cardiomyopathy.  TEE on 2/22 showed LA appendage thrombus. He was anticoagulated, then underwent DC-CV on 09/08/19 with conversion to NSR. Atrial fibrillation ablation in 9/21.  He is in NSR today.  - He no longer takes amiodarone .   - Continue apixaban , CBC today. 3. CKD stage 3: BMET today. 4. BPH/urinary obstruction: Has had TURP.  5. PVCs: Zio patch 01/2020 with 16% PVCs.  He is now off amiodarone .  Repeat Zio patch with 8% PVCs in 1/22.  -  Continue Coreg  12.5 mg bid.   BMET in 3 months, see APP 6 months.   I spent 32 minutes reviewing records, interviewing/examining patient, and managing orders.    Peder Bourdon, MD 10/10/23

## 2023-10-14 ENCOUNTER — Ambulatory Visit: Payer: Medicare Other | Admitting: Urology

## 2023-10-14 VITALS — BP 99/69 | HR 75

## 2023-10-14 DIAGNOSIS — R310 Gross hematuria: Secondary | ICD-10-CM

## 2023-10-14 DIAGNOSIS — N138 Other obstructive and reflux uropathy: Secondary | ICD-10-CM | POA: Diagnosis not present

## 2023-10-14 DIAGNOSIS — N401 Enlarged prostate with lower urinary tract symptoms: Secondary | ICD-10-CM

## 2023-10-14 LAB — MICROSCOPIC EXAMINATION

## 2023-10-14 LAB — BLADDER SCAN AMB NON-IMAGING: Scan Result: 12

## 2023-10-14 LAB — URINALYSIS, ROUTINE W REFLEX MICROSCOPIC
Bilirubin, UA: NEGATIVE
Ketones, UA: NEGATIVE
Leukocytes,UA: NEGATIVE
Nitrite, UA: NEGATIVE
Protein,UA: NEGATIVE
Specific Gravity, UA: 1.015 (ref 1.005–1.030)
Urobilinogen, Ur: 0.2 mg/dL (ref 0.2–1.0)
pH, UA: 6.5 (ref 5.0–7.5)

## 2023-10-14 NOTE — Progress Notes (Signed)
 Name: Chris Guerrero DOB: 07/19/1940 MRN: [id number]  History of Present Illness: Chris Guerrero is a 83 y.o. male who presents today for follow up visit at [facility]. - GU history: 1. Microscopic hematuria.  2. BPH with nocturia x1. Underwent TURP by Dr. Ottelin in June 2021.  Cystoscopy at that time demonstrated no bladder lesions. 3. Distant history of kidney stones.  4. Prior UTIs. 5. Erectile dysfunction.  At last visit with Dr. Inga Manges on 10/15/2022: Doing well.  Since last visit: 12/29/2022: Seen in ER for gross hematuria.  - Normal CBC.  - No acute GU findings on BMP (creatinine 1.22, GFR 58). - UA positive for >50 RBC/hpf; negative for leukocytes or nitrites. - CT stone study showed a 4 mm non-obstructing left kidney stone and prostatomegaly; no masses or hydronephrosis. - Treated with Keflex  for possible UTI; no urine culture done.  103/24: He returns today for cystoscopy to complete  his hematuria w/u.   A cytology was negative.  10/14/23: Chris Guerrero returns today in f/u.  He has had no further hematuria. His UA today has 3-10 RBC's.  HE has a 4mm left renal stone but no flank pain.  His IPSS is 1 with nocturia.      Fall Screening: Do you usually have a device to assist in your mobility? No   Medications: Current Outpatient Medications  Medication Sig Dispense Refill   apixaban  (ELIQUIS ) 5 MG TABS tablet Take 1 tablet (5 mg total) by mouth 2 (two) times daily. 60 tablet 11   atorvastatin  (LIPITOR) 10 MG tablet Take 10 mg by mouth daily.     carvedilol  (COREG ) 12.5 MG tablet Take 1 tablet (12.5 mg total) by mouth 2 (two) times daily with a meal. NEEDS FOLLOW UP APPOINTMENT FOR MORE REFILLS 180 tablet 0   empagliflozin (JARDIANCE) 25 MG TABS tablet Take 25 mg by mouth daily.     metFORMIN (GLUCOPHAGE) 500 MG tablet Take 1 tablet by mouth in the morning and at bedtime.     Multiple Vitamin (MULTIVITAMIN WITH MINERALS) TABS tablet Take 1 tablet by mouth daily.      sacubitril -valsartan  (ENTRESTO ) 49-51 MG Take 1 tablet by mouth 2 (two) times daily. 60 tablet 11   traZODone  (DESYREL ) 50 MG tablet Take 50 mg by mouth at bedtime.     No current facility-administered medications for this visit.    Allergies: No Known Allergies  Past Medical History:  Diagnosis Date   Atrial fibrillation (HCC)    BPH with obstruction/lower urinary tract symptoms    CHF (congestive heart failure) (HCC)    CKD (chronic kidney disease), stage III (HCC)    Diabetes mellitus without complication (HCC)    History of kidney stones    Hyperlipemia    Hypertension    Nonischemic cardiomyopathy (HCC)    Past Surgical History:  Procedure Laterality Date   ATRIAL FIBRILLATION ABLATION N/A 02/07/2020   Procedure: ATRIAL FIBRILLATION ABLATION;  Surgeon: Lei Pump, MD;  Location: [facility] INVASIVE CV LAB;  Service: Cardiovascular;  Laterality: N/A;   CARDIOVERSION N/A 09/08/2019   Procedure: CARDIOVERSION;  Surgeon: Darlis Eisenmenger, MD;  Location: [facility] ENDOSCOPY;  Service: Cardiovascular;  Laterality: N/A;   CATARACT EXTRACTION W/ INTRAOCULAR LENS IMPLANT     CHOLECYSTECTOMY     COLONOSCOPY  09/04/2010   RMR: diverticula, cecal polyp = tubular adenoma; Repeat in 5 years   COLONOSCOPY N/A 10/10/2015   Procedure: COLONOSCOPY;  Surgeon: Suzette Espy, MD;  Location: [facility]  ENDO SUITE;  Service: Endoscopy;  Laterality: N/A;  1300   HERNIA REPAIR     RIGHT/LEFT HEART CATH AND CORONARY ANGIOGRAPHY N/A 08/04/2019   Procedure: RIGHT/LEFT HEART CATH AND CORONARY ANGIOGRAPHY;  Surgeon: Darlis Eisenmenger, MD;  Location: [facility] INVASIVE CV LAB;  Service: Cardiovascular;  Laterality: N/A;   TEE WITHOUT CARDIOVERSION N/A 08/07/2019   Procedure: TRANSESOPHAGEAL ECHOCARDIOGRAM (TEE);  Surgeon: Darlis Eisenmenger, MD;  Location: [facility] ENDOSCOPY;  Service: Cardiovascular;  Laterality: N/A;   TEE WITHOUT CARDIOVERSION N/A 09/08/2019   Procedure: TRANSESOPHAGEAL ECHOCARDIOGRAM (TEE);  Surgeon: Darlis Eisenmenger, MD;  Location: [facility] ENDOSCOPY;  Service: Cardiovascular;  Laterality: N/A;   TRANSURETHRAL RESECTION OF PROSTATE N/A 11/27/2019   Procedure: TRANSURETHRAL RESECTION OF THE PROSTATE (TURP);  Surgeon: Ottelin, Mark, MD;  Location: [facility] ORS;  Service: Urology;  Laterality: N/A;   Family History  Problem Relation Age of Onset   Diabetes Mother    Hypertension Mother    Heart disease Mother    Diabetes Father    Hypertension Father    Heart disease Father    Stroke Father    Colon cancer Neg Hx    Social History   Socioeconomic History   Marital status: Divorced    Spouse name: Not on file   Number of children: Not on file   Years of education: Not on file   Highest education level: Not on file  Occupational History   Not on file  Tobacco Use   Smoking status: Light Smoker    Types: Cigars   Smokeless tobacco: Current    Types: Chew   Tobacco comments:    rarely chews tobacco cigar  Vaping Use   Vaping status: Never Used  Substance and Sexual Activity   Alcohol use: Yes    Alcohol/week: 1.0 standard drink of alcohol    Types: 1 Cans of beer per week   Drug use: No   Sexual activity: Not Currently  Other Topics Concern   Not on file  Social History Narrative   Not on file   Social Drivers of Health   Financial Resource Strain: Not on file  Food Insecurity: Not on file  Transportation Needs: Not on file  Physical Activity: Not on file  Stress: Not on file  Social Connections: Not on file  Intimate Partner Violence: Not on file    Review of Systems Constitutional: Patient denies any unintentional weight loss or change in strength lntegumentary: Patient denies any rashes or pruritus Cardiovascular: Patient denies chest pain or syncope Respiratory: Patient denies shortness of breath Gastrointestinal: Patient denies nausea, vomiting, constipation, or diarrhea Musculoskeletal: Patient denies muscle cramps or weakness Neurologic: Patient denies convulsions or  seizures Psychiatric: Patient denies memory problems Allergic/Immunologic: Patient denies recent allergic reaction(s) Hematologic/Lymphatic: Patient denies bleeding tendencies Endocrine: Patient denies heat/cold intolerance  GU: As per HPI.  OBJECTIVE Vitals:   10/14/23 0921  BP: 99/69  Pulse: 75    There is no height or weight on file to calculate BMI.  Physical Examination   03/18/23: Procedure: Flex Cystoscopy  He was prepped with betadine and 2% lidocaine  jelly and given PO Cipro  500mg .  The scope was passed without difficulty.  The urethra is normal.  The external sphincter is intact.  The prostatic urethra is about 3-4cm with some right apical and mid regrowth with mild adhesions and proximal edema on the right.   The bladder has moderate trabeculation without mucosal lesions and the UO's are normal.  He had mild bleeding  from disruption of the adhesions.   PVR 12ml  UA 3-10 RBC's today.  ASSESSMENT BPH with regrowth and hematuria.  He is doing well without gross hematuria but he has 3-10 RBC's today.   Renal stone.  No treatment needed.     PLAN  2. Return in about 1 year (around 10/13/2024) for any available provider with PVR. .  Orders Placed This Encounter  Procedures   Microscopic Examination   Urinalysis, Routine w reflex microscopic   BLADDER SCAN AMB NON-IMAGING     There are no Patient Instructions on file for this visit.  Electronically signed by:  Homero Luster, MD   10/15/23    1:05 PM

## 2023-10-14 NOTE — Progress Notes (Signed)
 Bladder Scan completed today.  Patient can void prior to the bladder scan. Bladder scan result: 12  Performed By: Melvenia Stabs. CMA  Additional notes-

## 2023-10-15 ENCOUNTER — Encounter: Payer: Self-pay | Admitting: Urology

## 2023-10-18 DIAGNOSIS — I5022 Chronic systolic (congestive) heart failure: Secondary | ICD-10-CM | POA: Diagnosis not present

## 2023-10-18 DIAGNOSIS — E1129 Type 2 diabetes mellitus with other diabetic kidney complication: Secondary | ICD-10-CM | POA: Diagnosis not present

## 2023-10-18 DIAGNOSIS — I480 Paroxysmal atrial fibrillation: Secondary | ICD-10-CM | POA: Diagnosis not present

## 2023-10-18 DIAGNOSIS — Z79899 Other long term (current) drug therapy: Secondary | ICD-10-CM | POA: Diagnosis not present

## 2023-10-18 DIAGNOSIS — E875 Hyperkalemia: Secondary | ICD-10-CM | POA: Diagnosis not present

## 2023-10-25 DIAGNOSIS — N1831 Chronic kidney disease, stage 3a: Secondary | ICD-10-CM | POA: Diagnosis not present

## 2023-10-25 DIAGNOSIS — E875 Hyperkalemia: Secondary | ICD-10-CM | POA: Diagnosis not present

## 2023-10-25 DIAGNOSIS — E1122 Type 2 diabetes mellitus with diabetic chronic kidney disease: Secondary | ICD-10-CM | POA: Diagnosis not present

## 2023-10-25 DIAGNOSIS — I5030 Unspecified diastolic (congestive) heart failure: Secondary | ICD-10-CM | POA: Diagnosis not present

## 2023-10-27 ENCOUNTER — Ambulatory Visit (HOSPITAL_COMMUNITY)
Admission: RE | Admit: 2023-10-27 | Discharge: 2023-10-27 | Disposition: A | Discharge status: Home / Self Care | Source: Ambulatory Visit | Attending: Cardiology | Admitting: Cardiology

## 2023-10-27 ENCOUNTER — Ambulatory Visit (HOSPITAL_COMMUNITY): Payer: Self-pay | Admitting: Cardiology

## 2023-10-27 DIAGNOSIS — I5042 Chronic combined systolic (congestive) and diastolic (congestive) heart failure: Secondary | ICD-10-CM | POA: Insufficient documentation

## 2023-10-27 LAB — ECHOCARDIOGRAM COMPLETE
AR max vel: 2.34 cm2
AV Area VTI: 2.71 cm2
AV Area mean vel: 2.31 cm2
AV Mean grad: 2.3 mmHg
AV Peak grad: 4.4 mmHg
Ao pk vel: 1.05 m/s
Area-P 1/2: 3.17 cm2
Est EF: 50
S' Lateral: 4.5 cm

## 2023-10-27 NOTE — Progress Notes (Signed)
*  PRELIMINARY RESULTS* Echocardiogram 2D Echocardiogram has been performed.  Chris Guerrero 10/27/2023, 9:19 AM

## 2023-10-28 NOTE — Telephone Encounter (Addendum)
 Pt aware, agreeable, and verbalized understanding   ----- Message from Chris Guerrero sent at 10/27/2023  9:52 PM EDT ----- EF 50%, mildly decreased.  Stable.

## 2023-11-18 NOTE — Progress Notes (Deleted)
 Name: Chris Guerrero DOB: Jul 20, 1940 MRN: [id number]  History of Present Illness: Chris Guerrero is a 83 y.o. male who presents today for follow up visit at [facility] [place].  Relevant History includes: 1. Microscopic hematuria. - 02/02/2023: Negative voided cytology.   2. BPH with nocturia x1.  - June 2021: TURP by Dr. Ottelin.    3. Kidney stones.  - 12/29/2022: CT showed a 4 mm non-obstructing left renal calculus.  4. Prior UTIs. 5. Erectile dysfunction.  At last visit with Dr. Inga Manges on 10/14/2023: - No further gross hematuria. UA with 3-10 RBCs.  - Asymptomatic for 4 mm left renal stone.  - Cystoscopy: "The prostatic urethra is about 3-4cm with some right apical and mid regrowth with mild adhesions and proximal edema on the right. The bladder has moderate trabeculation without mucosal lesions and the UO's are normal. He had mild bleeding from disruption of the adhesions."   Since last visit: ***  Today: He reports ***gross hematuria   He {Actions; denies-reports:120008} increased urinary urgency, frequency, nocturia, dysuria, hesitancy, straining to void, or sensations of incomplete emptying.  He {Actions; denies-reports:120008} flank pain or abdominal pain. He {Actions; denies-reports:120008} fevers, nausea, or vomiting.  ***passing stone?   Medications: Current Outpatient Medications  Medication Sig Dispense Refill   apixaban  (ELIQUIS ) 5 MG TABS tablet Take 1 tablet (5 mg total) by mouth 2 (two) times daily. 60 tablet 11   atorvastatin  (LIPITOR) 10 MG tablet Take 10 mg by mouth daily.     carvedilol  (COREG ) 12.5 MG tablet Take 1 tablet (12.5 mg total) by mouth 2 (two) times daily with a meal. NEEDS FOLLOW UP APPOINTMENT FOR MORE REFILLS 180 tablet 0   empagliflozin (JARDIANCE) 25 MG TABS tablet Take 25 mg by mouth daily.     metFORMIN (GLUCOPHAGE) 500 MG tablet Take 1 tablet by mouth in the morning and at bedtime.     Multiple Vitamin (MULTIVITAMIN WITH  MINERALS) TABS tablet Take 1 tablet by mouth daily.     sacubitril -valsartan  (ENTRESTO ) 49-51 MG Take 1 tablet by mouth 2 (two) times daily. 60 tablet 11   traZODone  (DESYREL ) 50 MG tablet Take 50 mg by mouth at bedtime.     No current facility-administered medications for this visit.    Allergies: No Known Allergies  Past Medical History:  Diagnosis Date   Atrial fibrillation (HCC)    BPH with obstruction/lower urinary tract symptoms    CHF (congestive heart failure) (HCC)    CKD (chronic kidney disease), stage III (HCC)    Diabetes mellitus without complication (HCC)    History of kidney stones    Hyperlipemia    Hypertension    Nonischemic cardiomyopathy (HCC)    Past Surgical History:  Procedure Laterality Date   ATRIAL FIBRILLATION ABLATION N/A 02/07/2020   Procedure: ATRIAL FIBRILLATION ABLATION;  Surgeon: Lei Pump, MD;  [facility]: [facility] INVASIVE CV LAB;  Service: Cardiovascular;  Laterality: N/A;   CARDIOVERSION N/A 09/08/2019   Procedure: CARDIOVERSION;  Surgeon: Darlis Eisenmenger, MD;  Location: [facility] ENDOSCOPY;  Service: Cardiovascular;  Laterality: N/A;   CATARACT EXTRACTION W/ INTRAOCULAR LENS IMPLANT     CHOLECYSTECTOMY     COLONOSCOPY  09/04/2010   RMR: diverticula, cecal polyp = tubular adenoma; Repeat in 5 years   COLONOSCOPY N/A 10/10/2015   Procedure: COLONOSCOPY;  Surgeon: Suzette Espy, MD;  Location: [facility] ENDO [facility];  Service: Endoscopy;  Laterality: N/A;  1300   HERNIA REPAIR     RIGHT/LEFT HEART  CATH AND CORONARY ANGIOGRAPHY N/A 08/04/2019   Procedure: RIGHT/LEFT HEART CATH AND CORONARY ANGIOGRAPHY;  Surgeon: Darlis Eisenmenger, MD;  Location: [facility] INVASIVE CV LAB;  Service: Cardiovascular;  Laterality: N/A;   TEE WITHOUT CARDIOVERSION N/A 08/07/2019   Procedure: TRANSESOPHAGEAL ECHOCARDIOGRAM (TEE);  Surgeon: Darlis Eisenmenger, MD;  Location: [facility] ENDOSCOPY;  Service: Cardiovascular;  Laterality: N/A;   TEE WITHOUT CARDIOVERSION N/A 09/08/2019   Procedure:  TRANSESOPHAGEAL ECHOCARDIOGRAM (TEE);  Surgeon: Darlis Eisenmenger, MD;  Location: [facility] ENDOSCOPY;  Service: Cardiovascular;  Laterality: N/A;   TRANSURETHRAL RESECTION OF PROSTATE N/A 11/27/2019   Procedure: TRANSURETHRAL RESECTION OF THE PROSTATE (TURP);  Surgeon: Ottelin, Mark, MD;  Location: [facility] ORS;  Service: Urology;  Laterality: N/A;   Family History  Problem Relation Age of Onset   Diabetes Mother    Hypertension Mother    Heart disease Mother    Diabetes Father    Hypertension Father    Heart disease Father    Stroke Father    Colon cancer Neg Hx    Social History   Socioeconomic History   Marital status: Divorced    Spouse name: Not on file   Number of children: Not on file   Years of education: Not on file   Highest education level: Not on file  Occupational History   Not on file  Tobacco Use   Smoking status: Light Smoker    Types: Cigars   Smokeless tobacco: Current    Types: Chew   Tobacco comments:    rarely chews tobacco cigar  Vaping Use   Vaping status: Never Used  Substance and Sexual Activity   Alcohol use: Yes    Alcohol/week: 1.0 standard drink of alcohol    Types: 1 Cans of beer per week   Drug use: No   Sexual activity: Not Currently  Other Topics Concern   Not on file  Social History Narrative   Not on file   Social Drivers of Health   Financial Resource Strain: Not on file  Food Insecurity: Not on file  Transportation Needs: Not on file  Physical Activity: Not on file  Stress: Not on file  Social Connections: Not on file  Intimate Partner Violence: Not on file    Review of Systems Constitutional: Patient denies any unintentional weight loss or change in strength lntegumentary: Patient denies any rashes or pruritus Cardiovascular: Patient denies chest pain or syncope Respiratory: Patient denies shortness of breath Gastrointestinal: ***Patient denies nausea, vomiting, constipation, or diarrhea ***As per HPI Musculoskeletal: Patient  denies muscle cramps or weakness Neurologic: Patient denies convulsions or seizures Allergic/Immunologic: Patient denies recent allergic reaction(s) Hematologic/Lymphatic: Patient denies bleeding tendencies Endocrine: Patient denies heat/cold intolerance  GU: As per HPI.  OBJECTIVE There were no vitals filed for this visit. There is no height or weight on file to calculate BMI.  Physical Examination Constitutional: No obvious distress; patient is non-toxic appearing  Cardiovascular: No visible lower extremity edema.  Respiratory: The patient does not have audible wheezing/stridor; respirations do not appear labored  Gastrointestinal: Abdomen non-distended Musculoskeletal: Normal ROM of UEs  Skin: No obvious rashes/open sores  Neurologic: CN 2-12 grossly intact Psychiatric: Answered questions appropriately with normal affect  Hematologic/Lymphatic/Immunologic: No obvious bruises or sites of spontaneous bleeding  UA: ***negative ***positive for *** leukocytes, *** blood, ***nitrites Urine microscopy: *** WBC/hpf, *** RBC/hpf, *** bacteria ***glucosuria (secondary to ***Jardiance ***Farxiga  use) ***otherwise unremarkable  PVR: *** ml  ASSESSMENT No diagnosis found. ***  We agreed to plan for follow  up in *** months / ***1 year or sooner if needed. Patient verbalized understanding of and agreement with current plan. All questions were answered.  PLAN Advised the following: 1. *** 2. ***No follow-ups on file.  No orders of the defined types were placed in this encounter.   It has been explained that the patient is to follow regularly with their PCP in addition to all other providers involved in their care and to follow instructions provided by these respective offices. Patient advised to contact urology clinic if any urologic-pertaining questions, concerns, new symptoms or problems arise in the interim period.  There are no Patient Instructions on file for this  visit.  Electronically signed by:  Lauretta Ponto, FNP   11/18/23    10:10 AM

## 2023-11-19 ENCOUNTER — Ambulatory Visit: Admitting: Urology

## 2023-11-19 ENCOUNTER — Encounter: Payer: Self-pay | Admitting: Urology

## 2023-11-19 ENCOUNTER — Ambulatory Visit (HOSPITAL_COMMUNITY)
Admission: RE | Admit: 2023-11-19 | Discharge: 2023-11-19 | Disposition: A | Discharge status: Home / Self Care | Source: Ambulatory Visit | Attending: Urology | Admitting: Urology

## 2023-11-19 VITALS — BP 133/83 | HR 84

## 2023-11-19 DIAGNOSIS — R3129 Other microscopic hematuria: Secondary | ICD-10-CM

## 2023-11-19 DIAGNOSIS — N200 Calculus of kidney: Secondary | ICD-10-CM

## 2023-11-19 DIAGNOSIS — N138 Other obstructive and reflux uropathy: Secondary | ICD-10-CM

## 2023-11-19 DIAGNOSIS — R531 Weakness: Secondary | ICD-10-CM

## 2023-11-19 DIAGNOSIS — R310 Gross hematuria: Secondary | ICD-10-CM | POA: Diagnosis not present

## 2023-11-19 DIAGNOSIS — Z87442 Personal history of urinary calculi: Secondary | ICD-10-CM

## 2023-11-19 DIAGNOSIS — N401 Enlarged prostate with lower urinary tract symptoms: Secondary | ICD-10-CM

## 2023-11-19 LAB — URINALYSIS, ROUTINE W REFLEX MICROSCOPIC
Bilirubin, UA: NEGATIVE
Leukocytes,UA: NEGATIVE
Nitrite, UA: NEGATIVE
Specific Gravity, UA: 1.02 (ref 1.005–1.030)
Urobilinogen, Ur: 1 mg/dL (ref 0.2–1.0)
pH, UA: 6.5 (ref 5.0–7.5)

## 2023-11-19 LAB — BLADDER SCAN AMB NON-IMAGING: Scan Result: 0

## 2023-11-19 LAB — MICROSCOPIC EXAMINATION
Bacteria, UA: NONE SEEN
RBC, Urine: >30 /HPF — AB (ref 0–2)

## 2023-11-19 MED ORDER — FINASTERIDE 5 MG PO TABS: 5.0000 mg | ORAL_TABLET | Freq: Every day | ORAL | 5 refills | Status: DC

## 2023-11-19 NOTE — Progress Notes (Signed)
 Name: Chris Guerrero DOB: 1940-08-07 MRN: [id number]  History of Present Illness: Mr. Baty is a 83 y.o. male who presents today for follow up visit at [facility] [place].  Relevant History includes: 1. Microscopic hematuria. - 02/02/2023: Negative voided cytology.   2. BPH with nocturia x1.  - June 2021: TURP by Dr. Ottelin.    3. Kidney stones.  - 12/29/2022: CT showed a 4 mm non-obstructing left renal calculus.  4. Prior UTIs. 5. Erectile dysfunction.  At last visit with Dr. Inga Manges on 10/14/2023: - No further gross hematuria. UA with 3-10 RBCs.  - Asymptomatic for 4 mm left renal stone.  - Cystoscopy: "The prostatic urethra is about 3-4cm with some right apical and mid regrowth with mild adhesions and proximal edema on the right. The bladder has moderate trabeculation without mucosal lesions and the UO's are normal. He had mild bleeding from disruption of the adhesions."  Today: He reports that on Wednesday 11/17/2023 he had some nausea, vomiting, and diarrhea then afterwards starting having gross hematuria with occasional clots, which has persisted since then but may be getting a little lighter today. Reports feeling somewhat weak; denies shortness of breath, dizziness, or chest pain. He denies increased urinary urgency, frequency, nocturia, dysuria, hesitancy, straining to void, or sensations of incomplete emptying.  He denies any correlating flank pain, abdominal pain, fevers. He states his bowel function has returned to normal.   Medications: Current Outpatient Medications  Medication Sig Dispense Refill   apixaban  (ELIQUIS ) 5 MG TABS tablet Take 1 tablet (5 mg total) by mouth 2 (two) times daily. 60 tablet 11   atorvastatin  (LIPITOR) 10 MG tablet Take 10 mg by mouth daily.     carvedilol  (COREG ) 12.5 MG tablet Take 1 tablet (12.5 mg total) by mouth 2 (two) times daily with a meal. NEEDS FOLLOW UP APPOINTMENT FOR MORE REFILLS 180 tablet 0   empagliflozin (JARDIANCE) 25  MG TABS tablet Take 25 mg by mouth daily.     finasteride (PROSCAR) 5 MG tablet Take 1 tablet (5 mg total) by mouth daily. 30 tablet 5   metFORMIN (GLUCOPHAGE) 500 MG tablet Take 1 tablet by mouth in the morning and at bedtime.     Multiple Vitamin (MULTIVITAMIN WITH MINERALS) TABS tablet Take 1 tablet by mouth daily.     sacubitril -valsartan  (ENTRESTO ) 49-51 MG Take 1 tablet by mouth 2 (two) times daily. 60 tablet 11   traZODone  (DESYREL ) 50 MG tablet Take 50 mg by mouth at bedtime.     No current facility-administered medications for this visit.    Allergies: No Known Allergies  Past Medical History:  Diagnosis Date   Atrial fibrillation (HCC)    BPH with obstruction/lower urinary tract symptoms    CHF (congestive heart failure) (HCC)    CKD (chronic kidney disease), stage III (HCC)    Diabetes mellitus without complication (HCC)    History of kidney stones    Hyperlipemia    Hypertension    Nonischemic cardiomyopathy (HCC)    Past Surgical History:  Procedure Laterality Date   ATRIAL FIBRILLATION ABLATION N/A 02/07/2020   Procedure: ATRIAL FIBRILLATION ABLATION;  Surgeon: Lei Pump, MD;  Location: [facility] INVASIVE CV LAB;  Service: Cardiovascular;  Laterality: N/A;   CARDIOVERSION N/A 09/08/2019   Procedure: CARDIOVERSION;  Surgeon: Darlis Eisenmenger, MD;  Location: [facility] ENDOSCOPY;  Service: Cardiovascular;  Laterality: N/A;   CATARACT EXTRACTION W/ INTRAOCULAR LENS IMPLANT     CHOLECYSTECTOMY     COLONOSCOPY  09/04/2010   RMR: diverticula, cecal polyp = tubular adenoma; Repeat in 5 years   COLONOSCOPY N/A 10/10/2015   Procedure: COLONOSCOPY;  Surgeon: Suzette Espy, MD;  Location: [facility];  Service: Endoscopy;  Laterality: N/A;  1300   HERNIA REPAIR     RIGHT/LEFT HEART CATH AND CORONARY ANGIOGRAPHY N/A 08/04/2019   Procedure: RIGHT/LEFT HEART CATH AND CORONARY ANGIOGRAPHY;  Surgeon: Darlis Eisenmenger, MD;  Location: [facility] INVASIVE CV LAB;  Service: Cardiovascular;   Laterality: N/A;   TEE WITHOUT CARDIOVERSION N/A 08/07/2019   Procedure: TRANSESOPHAGEAL ECHOCARDIOGRAM (TEE);  Surgeon: Darlis Eisenmenger, MD;  Location: [facility] ENDOSCOPY;  Service: Cardiovascular;  Laterality: N/A;   TEE WITHOUT CARDIOVERSION N/A 09/08/2019   Procedure: TRANSESOPHAGEAL ECHOCARDIOGRAM (TEE);  Surgeon: Darlis Eisenmenger, MD;  Location: [facility] ENDOSCOPY;  Service: Cardiovascular;  Laterality: N/A;   TRANSURETHRAL RESECTION OF PROSTATE N/A 11/27/2019   Procedure: TRANSURETHRAL RESECTION OF THE PROSTATE (TURP);  Surgeon: Ottelin, Mark, MD;  Location: [facility] ORS;  Service: Urology;  Laterality: N/A;   Family History  Problem Relation Age of Onset   Diabetes Mother    Hypertension Mother    Heart disease Mother    Diabetes Father    Hypertension Father    Heart disease Father    Stroke Father    Colon cancer Neg Hx    Social History   Socioeconomic History   Marital status: Divorced    Spouse name: Not on file   Number of children: Not on file   Years of education: Not on file   Highest education level: Not on file  Occupational History   Not on file  Tobacco Use   Smoking status: Light Smoker    Types: Cigars   Smokeless tobacco: Current    Types: Chew   Tobacco comments:    rarely chews tobacco cigar  Vaping Use   Vaping status: Never Used  Substance and Sexual Activity   Alcohol use: Yes    Alcohol/week: 1.0 standard drink of alcohol    Types: 1 Cans of beer per week   Drug use: No   Sexual activity: Not Currently  Other Topics Concern   Not on file  Social History Narrative   Not on file   Social Drivers of Health   Financial Resource Strain: Not on file  Food Insecurity: Not on file  Transportation Needs: Not on file  Physical Activity: Not on file  Stress: Not on file  Social Connections: Not on file  Intimate Partner Violence: Not on file    Review of Systems Constitutional: Patient denies any unintentional weight loss or change in  strength lntegumentary: Patient denies any rashes or pruritus Cardiovascular: Patient denies chest pain or syncope Respiratory: Patient denies shortness of breath Gastrointestinal: As per HPI Musculoskeletal: Patient denies muscle cramps or weakness Neurologic: Patient denies convulsions or seizures Allergic/Immunologic: Patient denies recent allergic reaction(s) Hematologic/Lymphatic: Patient denies bleeding tendencies Endocrine: Patient denies heat/cold intolerance  GU: As per HPI.  OBJECTIVE Vitals:   11/19/23 1255  BP: 133/83  Pulse: 84   There is no height or weight on file to calculate BMI.  Physical Examination Constitutional: No obvious distress; patient is non-toxic appearing  Cardiovascular: No visible lower extremity edema.  Respiratory: The patient does not have audible wheezing/stridor; respirations do not appear labored  Gastrointestinal: Abdomen non-distended Musculoskeletal: Normal ROM of UEs  Skin: No obvious rashes/open sores  Neurologic: CN 2-12 grossly intact Psychiatric: Answered questions appropriately with normal affect  Hematologic/Lymphatic/Immunologic: No  obvious bruises or sites of spontaneous bleeding  Urine microscopy: >30 RBC/hpf, otherwise unremarkable PVR: 1 ml  ASSESSMENT Gross hematuria - Plan: Urinalysis, Routine w reflex microscopic, BLADDER SCAN AMB NON-IMAGING, CBC, Basic metabolic panel with GFR, finasteride (PROSCAR) 5 MG tablet, DG Abd 1 View  BPH with urinary obstruction - Plan: Urinalysis, Routine w reflex microscopic, BLADDER SCAN AMB NON-IMAGING, CBC, Basic metabolic panel with GFR, finasteride (PROSCAR) 5 MG tablet  Renal stones - Plan: Urinalysis, Routine w reflex microscopic, BLADDER SCAN AMB NON-IMAGING, DG Abd 1 View  Weakness - Plan: CBC, Basic metabolic panel with GFR  For management of gross hematuria, we discussed possible etiologies including but not limited to: vigorous exercise, sexual activity, stone, trauma, blood  thinner use, urinary tract infection, kidney function, BPH, prostatitis, malignancy. For further evaluation the following are advised: KUB to assess for stone passage along with CBC and BMP. We agreed to start Proscar 5 mg daily for BPH; side effects discussed.   We discussed the risk for clot retention and pt was advised to increase fluid intake to thin out clots. Pt was advised to go to the ER if they become unable to urinate due to clot retention, start having symptoms of anemia (which were discussed), or any other significant concerning acute symptoms.  He was advised to go to the ER if He develops fever >100.5 F, uncontrollable pain, or other significantly concerning symptoms.  We agreed to plan for follow up in 2-3 weeks for recheck. Patient verbalized understanding of and agreement with current plan. All questions were answered.  PLAN Advised the following: 1. CBC & BMP. 2. KUB. 3. Maintain adequate fluid intake. 4. Start Proscar (Finasteride) 5 mg daily.  5. Return in about 2 weeks (around 12/03/2023) for UA, PVR, & f/u with Griselda Lederer NP.  Orders Placed This Encounter  Procedures   DG Abd 1 View    Standing Status:   Future    Expected Date:   11/19/2023    Expiration Date:   11/18/2024    Reason for Exam (SYMPTOM  OR DIAGNOSIS REQUIRED):   kidney stone    Preferred imaging location?:   [facility]   Urinalysis, Routine w reflex microscopic   CBC   Basic metabolic panel with GFR   BLADDER SCAN AMB NON-IMAGING    It has been explained that the patient is to follow regularly with their PCP in addition to all other providers involved in their care and to follow instructions provided by these respective offices. Patient advised to contact urology clinic if any urologic-pertaining questions, concerns, new symptoms or problems arise in the interim period.  There are no Patient Instructions on file for this visit.  Electronically signed by:  Lauretta Ponto, FNP   11/19/23     1:17 PM

## 2023-11-20 LAB — BASIC METABOLIC PANEL WITH GFR
BUN/Creatinine Ratio: 25 — ABNORMAL HIGH (ref 10–24)
BUN: 33 mg/dL — ABNORMAL HIGH (ref 8–27)
CO2: 22 mmol/L (ref 20–29)
Calcium: 10.6 mg/dL — ABNORMAL HIGH (ref 8.6–10.2)
Chloride: 95 mmol/L — ABNORMAL LOW (ref 96–106)
Creatinine, Ser: 1.3 mg/dL — ABNORMAL HIGH (ref 0.76–1.27)
Glucose: 139 mg/dL — ABNORMAL HIGH (ref 70–99)
Potassium: 4.5 mmol/L (ref 3.5–5.2)
Sodium: 136 mmol/L (ref 134–144)
eGFR: 54 mL/min/{1.73_m2} — ABNORMAL LOW (ref >59)

## 2023-11-20 LAB — CBC
Hematocrit: 49.5 % (ref 37.5–51.0)
Hemoglobin: 16.1 g/dL (ref 13.0–17.7)
MCH: 32.7 pg (ref 26.6–33.0)
MCHC: 32.5 g/dL (ref 31.5–35.7)
MCV: 100 fL — ABNORMAL HIGH (ref 79–97)
Platelets: 220 10*3/uL (ref 150–450)
RBC: 4.93 x10E6/uL (ref 4.14–5.80)
RDW: 12.1 % (ref 11.6–15.4)
WBC: 8.2 10*3/uL (ref 3.4–10.8)

## 2023-11-23 ENCOUNTER — Ambulatory Visit: Payer: Self-pay

## 2023-11-30 ENCOUNTER — Other Ambulatory Visit (HOSPITAL_COMMUNITY): Payer: Self-pay | Admitting: Cardiology

## 2023-12-08 NOTE — Progress Notes (Signed)
 Name: Chris Guerrero DOB: July 15, 1940 MRN: [id number]  History of Present Illness: Mr. Salahuddin is a 83 y.o. male who presents today for follow up visit at [facility].  Relevant History includes: 1. Microscopic hematuria. - 02/02/2023: Negative voided cytology.   - 10/14/2023: Cystoscopy by Dr. Watt showed The prostatic urethra is about 3-4cm with some right apical and mid regrowth with mild adhesions and proximal edema on the right. The bladder has moderate trabeculation without mucosal lesions and the UO's are normal. He had mild bleeding from disruption of the adhesions. 2. BPH with nocturia x1.  - June 2021: TURP by Dr. Ottelin.    3. Kidney stones.  - 12/29/2022: CT showed a 4 mm non-obstructing left renal calculus.  4. Prior UTIs. 5. Erectile dysfunction.  At last visit on 11/19/2023: - Seen for painless gross hematuria, possibly related to increased intra-abdominal pressure / straining with recent vomiting / diarrhea during viral GI illness. Recently had cystoscopy with no acute findings however seems like tissue was somewhat friable. - Advised the following: 1. CBC & BMP. His creatinine was mildly elevated (1.30). 2. KUB (negative - no GU stones)  3. Maintain adequate fluid intake. 4. Start Proscar  (Finasteride ) 5 mg daily.  5. Return in about 2 weeks (around 12/03/2023) for UA, PVR, & f/u with Lauraine Oz NP.  Today: He denies any gross hematuria since last visit. Today he denies any acute urinary symptoms. He denies flank pain or abdominal pain.  Medications: Current Outpatient Medications  Medication Sig Dispense Refill   apixaban  (ELIQUIS ) 5 MG TABS tablet Take 1 tablet (5 mg total) by mouth 2 (two) times daily. 60 tablet 11   atorvastatin  (LIPITOR) 10 MG tablet Take 10 mg by mouth daily.     carvedilol  (COREG ) 12.5 MG tablet Take 1 tablet (12.5 mg total) by mouth 2 (two) times daily with a meal. 180 tablet 0   empagliflozin (JARDIANCE) 25 MG TABS  tablet Take 25 mg by mouth daily.     finasteride  (PROSCAR ) 5 MG tablet Take 1 tablet (5 mg total) by mouth daily. 30 tablet 5   metFORMIN (GLUCOPHAGE) 500 MG tablet Take 1 tablet by mouth in the morning and at bedtime.     Multiple Vitamin (MULTIVITAMIN WITH MINERALS) TABS tablet Take 1 tablet by mouth daily.     sacubitril -valsartan  (ENTRESTO ) 49-51 MG Take 1 tablet by mouth 2 (two) times daily. 60 tablet 11   traZODone  (DESYREL ) 50 MG tablet Take 50 mg by mouth at bedtime.     No current facility-administered medications for this visit.    Allergies: No Known Allergies  Past Medical History:  Diagnosis Date   Atrial fibrillation (HCC)    BPH with obstruction/lower urinary tract symptoms    CHF (congestive heart failure) (HCC)    CKD (chronic kidney disease), stage III (HCC)    Diabetes mellitus without complication (HCC)    History of kidney stones    Hyperlipemia    Hypertension    Nonischemic cardiomyopathy (HCC)    Past Surgical History:  Procedure Laterality Date   ATRIAL FIBRILLATION ABLATION N/A 02/07/2020   Procedure: ATRIAL FIBRILLATION ABLATION;  Surgeon: Inocencio Soyla Lunger, MD;  Location: [facility] INVASIVE CV LAB;  Service: Cardiovascular;  Laterality: N/A;   CARDIOVERSION N/A 09/08/2019   Procedure: CARDIOVERSION;  Surgeon: Rolan Ezra RAMAN, MD;  Location: [facility] ENDOSCOPY;  Service: Cardiovascular;  Laterality: N/A;   CATARACT EXTRACTION W/ INTRAOCULAR LENS IMPLANT     CHOLECYSTECTOMY  COLONOSCOPY  09/04/2010   RMR: diverticula, cecal polyp = tubular adenoma; Repeat in 5 years   COLONOSCOPY N/A 10/10/2015   Procedure: COLONOSCOPY;  Surgeon: Lamar CHRISTELLA Hollingshead, MD;  Location: [facility] ENDO SUITE;  Service: Endoscopy;  Laterality: N/A;  1300   HERNIA REPAIR     RIGHT/LEFT HEART CATH AND CORONARY ANGIOGRAPHY N/A 08/04/2019   Procedure: RIGHT/LEFT HEART CATH AND CORONARY ANGIOGRAPHY;  Surgeon: Rolan Ezra RAMAN, MD;  Location: [facility] INVASIVE CV LAB;  Service: Cardiovascular;  Laterality:  N/A;   TEE WITHOUT CARDIOVERSION N/A 08/07/2019   Procedure: TRANSESOPHAGEAL ECHOCARDIOGRAM (TEE);  Surgeon: Rolan Ezra RAMAN, MD;  Location: [facility] ENDOSCOPY;  Service: Cardiovascular;  Laterality: N/A;   TEE WITHOUT CARDIOVERSION N/A 09/08/2019   Procedure: TRANSESOPHAGEAL ECHOCARDIOGRAM (TEE);  Surgeon: Rolan Ezra RAMAN, MD;  Location: [facility] ENDOSCOPY;  Service: Cardiovascular;  Laterality: N/A;   TRANSURETHRAL RESECTION OF PROSTATE N/A 11/27/2019   Procedure: TRANSURETHRAL RESECTION OF THE PROSTATE (TURP);  Surgeon: Ottelin, Mark, MD;  Location: [facility] ORS;  Service: Urology;  Laterality: N/A;   Family History  Problem Relation Age of Onset   Diabetes Mother    Hypertension Mother    Heart disease Mother    Diabetes Father    Hypertension Father    Heart disease Father    Stroke Father    Colon cancer Neg Hx    Social History   Socioeconomic History   Marital status: Divorced    Spouse name: Not on file   Number of children: Not on file   Years of education: Not on file   Highest education level: Not on file  Occupational History   Not on file  Tobacco Use   Smoking status: Light Smoker    Types: Cigars   Smokeless tobacco: Current    Types: Chew   Tobacco comments:    rarely chews tobacco cigar  Vaping Use   Vaping status: Never Used  Substance and Sexual Activity   Alcohol use: Yes    Alcohol/week: 1.0 standard drink of alcohol    Types: 1 Cans of beer per week   Drug use: No   Sexual activity: Not Currently  Other Topics Concern   Not on file  Social History Narrative   Not on file   Social Drivers of Health   Financial Resource Strain: Not on file  Food Insecurity: Not on file  Transportation Needs: Not on file  Physical Activity: Not on file  Stress: Not on file  Social Connections: Not on file  Intimate Partner Violence: Not on file    Review of Systems Constitutional: Patient denies any unintentional weight loss or change in strength lntegumentary: Patient  denies any rashes or pruritus Cardiovascular: Patient denies chest pain or syncope Respiratory: Patient denies shortness of breath Gastrointestinal: Patient denies nausea, vomiting, constipation, or diarrhea  Musculoskeletal: Patient denies muscle cramps or weakness Neurologic: Patient denies convulsions or seizures Allergic/Immunologic: Patient denies recent allergic reaction(s) Hematologic/Lymphatic: Patient denies bleeding tendencies Endocrine: Patient denies heat/cold intolerance  GU: As per HPI.  OBJECTIVE Vitals:   12/10/23 1305  BP: 122/75  Pulse: 75   There is no height or weight on file to calculate BMI.  Physical Examination Constitutional: No obvious distress; patient is non-toxic appearing  Cardiovascular: No visible lower extremity edema.  Respiratory: The patient does not have audible wheezing/stridor; respirations do not appear labored  Gastrointestinal: Abdomen non-distended Musculoskeletal: Normal ROM of UEs  Skin: No obvious rashes/open sores  Neurologic: CN 2-12 grossly intact Psychiatric: Answered questions  appropriately with normal affect  Hematologic/Lymphatic/Immunologic: No obvious bruises or sites of spontaneous bleeding  Urine microscopy: 0-5 WBC/hpf, 3-10 RBC/hpf, moderate bacteria, glucosuria (secondary to Jardiance use)  PVR: 1 ml  ASSESSMENT Gross hematuria - Plan: Urinalysis, Routine w reflex microscopic, Basic metabolic panel with GFR  BPH with urinary obstruction - Plan: BLADDER SCAN AMB NON-IMAGING  Renal stones  He is doing well with no acute concerns today. We agreed to repeat BMP to recheck his creatinine; suspect that was a transient elevation due to dehydration from GI illness at last visit. Advised to continue Proscar  for BPH to reduce risk for recurrence of gross hematuria. Will plan for follow up as previously scheduled with Dr. Matilda on 10/17/2024 or sooner if needed. Pt verbalized understanding and agreement. All questions were  answered.  PLAN Advised the following: 1. Repeat BMP today. 2. Continue Proscar  (Finasteride ) 5 mg daily. 3. Return for f/u with Dr. Matilda as previously scheduled.  Orders Placed This Encounter  Procedures   Urinalysis, Routine w reflex microscopic   Basic metabolic panel with GFR   BLADDER SCAN AMB NON-IMAGING   It has been explained that the patient is to follow regularly with their PCP in addition to all other providers involved in their care and to follow instructions provided by these respective offices. Patient advised to contact urology clinic if any urologic-pertaining questions, concerns, new symptoms or problems arise in the interim period.  There are no Patient Instructions on file for this visit.  Electronically signed by:  Lauraine JAYSON Oz, FNP   12/10/23    1:41 PM

## 2023-12-10 ENCOUNTER — Ambulatory Visit: Admitting: Urology

## 2023-12-10 ENCOUNTER — Encounter: Payer: Self-pay | Admitting: Urology

## 2023-12-10 VITALS — BP 122/75 | HR 75

## 2023-12-10 DIAGNOSIS — R310 Gross hematuria: Secondary | ICD-10-CM

## 2023-12-10 DIAGNOSIS — Z87442 Personal history of urinary calculi: Secondary | ICD-10-CM

## 2023-12-10 DIAGNOSIS — N401 Enlarged prostate with lower urinary tract symptoms: Secondary | ICD-10-CM | POA: Diagnosis not present

## 2023-12-10 DIAGNOSIS — N138 Other obstructive and reflux uropathy: Secondary | ICD-10-CM

## 2023-12-10 DIAGNOSIS — N200 Calculus of kidney: Secondary | ICD-10-CM

## 2023-12-10 LAB — MICROSCOPIC EXAMINATION

## 2023-12-10 LAB — URINALYSIS, ROUTINE W REFLEX MICROSCOPIC
Bilirubin, UA: NEGATIVE
Ketones, UA: NEGATIVE
Leukocytes,UA: NEGATIVE
Nitrite, UA: POSITIVE — AB
Specific Gravity, UA: 1.02 (ref 1.005–1.030)
Urobilinogen, Ur: 0.2 mg/dL (ref 0.2–1.0)
pH, UA: 6.5 (ref 5.0–7.5)

## 2023-12-10 NOTE — Progress Notes (Signed)
 Bladder Scan completed today.  Patient can void prior to the bladder scan. Bladder scan result: 1  Performed By: Exie DASEN. CMA

## 2023-12-11 LAB — BASIC METABOLIC PANEL WITH GFR
BUN/Creatinine Ratio: 15 (ref 10–24)
BUN: 18 mg/dL (ref 8–27)
CO2: 21 mmol/L (ref 20–29)
Calcium: 9.6 mg/dL (ref 8.6–10.2)
Chloride: 101 mmol/L (ref 96–106)
Creatinine, Ser: 1.19 mg/dL (ref 0.76–1.27)
Glucose: 86 mg/dL (ref 70–99)
Potassium: 4.5 mmol/L (ref 3.5–5.2)
Sodium: 138 mmol/L (ref 134–144)
eGFR: 60 mL/min/{1.73_m2} (ref >59)

## 2023-12-13 ENCOUNTER — Ambulatory Visit: Payer: Self-pay | Admitting: Urology

## 2024-02-01 DIAGNOSIS — E1129 Type 2 diabetes mellitus with other diabetic kidney complication: Secondary | ICD-10-CM | POA: Diagnosis not present

## 2024-02-01 DIAGNOSIS — E875 Hyperkalemia: Secondary | ICD-10-CM | POA: Diagnosis not present

## 2024-02-01 DIAGNOSIS — Z79899 Other long term (current) drug therapy: Secondary | ICD-10-CM | POA: Diagnosis not present

## 2024-02-01 DIAGNOSIS — I5022 Chronic systolic (congestive) heart failure: Secondary | ICD-10-CM | POA: Diagnosis not present

## 2024-02-01 DIAGNOSIS — I480 Paroxysmal atrial fibrillation: Secondary | ICD-10-CM | POA: Diagnosis not present

## 2024-02-08 DIAGNOSIS — I480 Paroxysmal atrial fibrillation: Secondary | ICD-10-CM | POA: Diagnosis not present

## 2024-02-08 DIAGNOSIS — E1122 Type 2 diabetes mellitus with diabetic chronic kidney disease: Secondary | ICD-10-CM | POA: Diagnosis not present

## 2024-02-08 DIAGNOSIS — I5022 Chronic systolic (congestive) heart failure: Secondary | ICD-10-CM | POA: Diagnosis not present

## 2024-02-08 DIAGNOSIS — R808 Other proteinuria: Secondary | ICD-10-CM | POA: Diagnosis not present

## 2024-02-08 DIAGNOSIS — Z79899 Other long term (current) drug therapy: Secondary | ICD-10-CM | POA: Diagnosis not present

## 2024-02-08 DIAGNOSIS — E785 Hyperlipidemia, unspecified: Secondary | ICD-10-CM | POA: Diagnosis not present

## 2024-02-09 ENCOUNTER — Ambulatory Visit (HOSPITAL_COMMUNITY): Payer: Self-pay | Admitting: Cardiology

## 2024-02-09 ENCOUNTER — Encounter: Payer: Self-pay | Admitting: Internal Medicine

## 2024-02-28 ENCOUNTER — Other Ambulatory Visit (HOSPITAL_COMMUNITY): Payer: Self-pay | Admitting: Cardiology

## 2024-03-07 DIAGNOSIS — Z23 Encounter for immunization: Secondary | ICD-10-CM | POA: Diagnosis not present

## 2024-04-05 NOTE — Progress Notes (Signed)
 t  Advanced Heart Failure Clinic Note    PCP: Sheryle Carwin, MD AHF: Dr. Rolan  Urology: Dr Ottelin   Chief complaint: CHF  HPI: 83 y.o. male w/  prior history of HTN and hyperlipidemia, who initially reported to [facility]  w/ complaints of about 1 month of cough and dyspnea with exertion as well as progressive edema. No chest pain. He saw his PCP prior to coming to the ED on 08/01/19 and was noted to be in atrial fibrillation with RVR and he was then sent to [facility] for admission. Hs-TnI was flat at 25 => 27.  BNP elevated, 1,068. COVID negative. He was initially tried on diltiazem  gtt but became hypotensive and changed to amiodarone  gtt.  Echo was done, showing EF < 20% with diffuse hypokinesis, severely decreased RV function. He was transferred to [facility] on 2/18 for HF work up and cath.    On arrival to [facility], he was found to be volume overloaded w/ soft SBPs and rising SCr up to 1.9 with attempts at diuresis, concerning for low output. PICC line was placed to measure co-ox and CVP. Initial co-ox low at 46%. He was started on milrinone  and IV Lasix . IV amiodarone  continued for afib + IV heparin . RHC/LHC 08/04/19 with no coronary disease, mildly elevated filling pressures. Co-ox improved w/ milrinone . He was ultimately weaned off milrinone  and co-ox remained stable. He diuresed well w/ IV Lasix  and later changed to torsemide . SCr improved w/ diuresis, down to 1.32.    He was set up for TEE/DCCV on 08/07/19 and found to have LAA thrombus on TEE. DCCV canceled. IV amiodarone  was discontinued and he was changed to PO, 200 mg bid. Placed on Eliquis  5 bid. ASA discontinued. Plan to repeat TEE/DCCV after 1 month of chronic anticoagulation. He was advised to continue amiodarone  200 mg bid x 7 days, followed by 200 mg daily.    Also of note, he develop urinary retention due to BPH. Had over 500cc on bladder scan. Multiple attempts were made by the nursing staff to place a foley which were unsuccessful. Urology was  consulted and placed 16 french council foley catheter. Subsequently, he had mild hematuria which ultimately cleared. Urology recommended foley to continue on discharge with plans to be followed closely in outpatient clinic. He was discharged from the hospital 08/09/19.   He had f/u w/ urology 08/15/19 and foley removed. After returning home, he redeveloped urinary retention and went to ED next day. Foley cath reinserted.   S/P DC-CV 09/08/19 with restoration of NSR.   He had TURP in 6/21.   Cardiac MRI in 6/21 showed EF 35%, LGE pattern consistent with lateral infarction.   He saw Dr. Inocencio and atrial fibrillation ablation was recommended. S/P A fib ablation 02/2020. Zio patch was placed to assess PVC burden. Had 16% PVC burden.    Echo in 1/22 showed EF up to 50-55% with normal RV.  Zio patch was repeated in 1/22, now 8% PVCs.   Echo in 11/23 showed EF 50-55%, mild LVH, normal RV.   Patient returns for followup of CHF.  He has been doing well generally.  No exertional dyspnea or chest pain.  No palpitations.  He is in NSR today.  Weight up 2 lbs.  No orthopnea/PND.  PCP stopped his spironolactone  due to hyperkalemia.   Labs (3/21): K 4.7, creatinine 1.6, TSH elevated, free T3/free T4 normal, LFTs normal.  Labs (5/21): LFTs normal, digoxin  0.8, TSH mildly elevated, free T3 and free  T4 normal.  Labs (6/21): K 4.9 => 5.3, creatinine 1.77 => 1.65 Labs (02/01/20): K 5.1 Creatinine 1.5  Labs (10/21): K 4.6, creatinine 1.34 Labs (1/22): K 4.9, creatinine 1.5 Labs (7/22): LDL 36 Labs (8/22): K 4.8, creatinine 1.26 Labs (9/22): K 4.3, creatinine 1.34 Labs (4/23): LDL 31, TGs 174, K 4.3, creatinine 1.2, hgb 15.1 Labs (8/23): K 4.8, creatinine 1.06 Labs (3/24): K 4.2, creatinine 1.06 Labs (5/24): LDL 30 Labs (7/24): K 3.9, creatinine 1.22  ECG:  NSR, PAC/PVC (personally reviewed)  PMH: 1. Atrial fibrillation: Paroxysmal.   - DCCV to NSR 3/21.  - Atrial fibrillation ablation 9/21 2. BPH with  urinary retention - TURP 6/21 3. Type 2 diabetes 4. HTN 5. Nephrolithiasis 6. CKD stage 3 7. Chronic systolic CHF: Nonischemic cardiomyopathy.  - Echo (2/21): EF < 20%, with severely decreased RV systolic function. - LHC (2/21): No significant CAD - Cardiac MRI (6/21): EF 35%, LGE pattern consistent with lateral infarction.  - Echo (1/22): EF 50-55%, normal RV.  - Echo (11/23): EF 50-55%, mild LVH, normal RV. 8. PVCs: 8/21 Zio patch with 16% PVCs.  - Zio patch (1/22): 8% PVCs 9. ABIs (9/22): Normal   Current Outpatient Medications  Medication Sig Dispense Refill   apixaban  (ELIQUIS ) 5 MG TABS tablet Take 1 tablet (5 mg total) by mouth 2 (two) times daily. 60 tablet 11   atorvastatin  (LIPITOR) 10 MG tablet Take 10 mg by mouth daily.     carvedilol  (COREG ) 12.5 MG tablet Take 1 tablet (12.5 mg total) by mouth 2 (two) times daily with a meal. 180 tablet 3   empagliflozin (JARDIANCE) 25 MG TABS tablet Take 25 mg by mouth daily.     finasteride  (PROSCAR ) 5 MG tablet Take 1 tablet (5 mg total) by mouth daily. 30 tablet 5   metFORMIN (GLUCOPHAGE) 500 MG tablet Take 1 tablet by mouth in the morning and at bedtime.     Multiple Vitamin (MULTIVITAMIN WITH MINERALS) TABS tablet Take 1 tablet by mouth daily.     sacubitril -valsartan  (ENTRESTO ) 49-51 MG Take 1 tablet by mouth 2 (two) times daily. 60 tablet 11   traZODone  (DESYREL ) 50 MG tablet Take 50 mg by mouth at bedtime.     No current facility-administered medications for this visit.    No Known Allergies    Social History   Socioeconomic History   Marital status: Divorced    Spouse name: Not on file   Number of children: Not on file   Years of education: Not on file   Highest education level: Not on file  Occupational History   Not on file  Tobacco Use   Smoking status: Light Smoker    Types: Cigars   Smokeless tobacco: Current    Types: Chew   Tobacco comments:    rarely chews tobacco cigar  Vaping Use   Vaping status:  Never Used  Substance and Sexual Activity   Alcohol use: Yes    Alcohol/week: 1.0 standard drink of alcohol    Types: 1 Cans of beer per week   Drug use: No   Sexual activity: Not Currently  Other Topics Concern   Not on file  Social History Narrative   Not on file   Social Drivers of Health   Financial Resource Strain: Not on file  Food Insecurity: Not on file  Transportation Needs: Not on file  Physical Activity: Not on file  Stress: Not on file  Social Connections: Not on file  Intimate Partner  Violence: Not on file      Family History  Problem Relation Age of Onset   Diabetes Mother    Hypertension Mother    Heart disease Mother    Diabetes Father    Hypertension Father    Heart disease Father    Stroke Father    Colon cancer Neg Hx     There were no vitals filed for this visit.  Wt Readings from Last 3 Encounters:  10/08/23 91.8 kg (202 lb 6.4 oz)  03/18/23 90.3 kg (199 lb)  12/29/22 90.3 kg (199 lb)   PHYSICAL EXAM: General: NAD Neck: No JVD, no thyromegaly or thyroid  nodule.  Lungs: Clear to auscultation bilaterally with normal respiratory effort. CV: Nondisplaced PMI.  Heart regular S1/S2, no S3/S4, no murmur.  No peripheral edema.  No carotid bruit.  Normal pedal pulses.  Abdomen: Soft, nontender, no hepatosplenomegaly, no distention.  Skin: Intact without lesions or rashes.  Neurologic: Alert and oriented x 3.  Psych: Normal affect. Extremities: No clubbing or cyanosis.  HEENT: Normal.   ASSESSMENT & PLAN: 1. Chronic systolic CHF: Echo 2/21 with EF <20%, severely decreased RV function. Nonischemic cardiomyopathy, ?tachycardia-mediated due to atrial fibrillation of uncertain duration.  He also has history of frequent PVCs (16% on 8/21 Zio patch).  He required milrinone  during 2/21 admission but was titrated off.  Coronary angiography in 2/21 with no significant coronary disease. Cardiac MRI in 6/21 with EF up to 35%, LGE pattern consistent with lateral  MI (surprising given no significant CAD on cath).  Echo in 1/22 with EF improved to 50-55%, normal RV.  Echo in 11/23 showed EF still in the 50-55% range.  NYHA class I-II, not volume overloaded on exam.  - Continue Coreg  12.5 mg bid.   - Off spironolactone  with hyperkalemia.  Will repeat echo, and if EF is still near normal, can stay off spironolactone .  - Continue Entresto  49/51 bid.  BMET/BNP today.  - Continue empagliflozin with CKD and CHF 2. Atrial fibrillation: Paroxysmal.  Admitted with afib/RVR of uncertain duration 2/21.  Unsure if atrial fibrillation/RVR was cause of CMP (tachy-mediated) or an effect of the cardiomyopathy.  TEE on 2/22 showed LA appendage thrombus. He was anticoagulated, then underwent DC-CV on 09/08/19 with conversion to NSR. Atrial fibrillation ablation in 9/21.  He is in NSR today.  - He no longer takes amiodarone .   - Continue apixaban , CBC today. 3. CKD stage 3: BMET today. 4. BPH/urinary obstruction: Has had TURP.  5. PVCs: Zio patch 01/2020 with 16% PVCs.  He is now off amiodarone .  Repeat Zio patch with 8% PVCs in 1/22.  -  Continue Coreg  12.5 mg bid.   BMET in 3 months, see APP 6 months.   I spent 32 minutes reviewing records, interviewing/examining patient, and managing orders.    Harlene CHRISTELLA Gainer, FNP 04/05/24

## 2024-04-06 ENCOUNTER — Telehealth (HOSPITAL_COMMUNITY): Payer: Self-pay

## 2024-04-06 NOTE — Telephone Encounter (Signed)
 Called to confirm/remind patient of their appointment at the Advanced Heart Failure Clinic on 04/07/24.   Appointment:   [x] Confirmed  [] Left mess   [] No answer/No voice mail  [] VM Full/unable to leave message  [] Phone not in service  Patient reminded to bring all medications and/or complete list.  Confirmed patient has transportation. Gave directions, instructed to utilize valet parking.

## 2024-04-07 ENCOUNTER — Ambulatory Visit (HOSPITAL_COMMUNITY): Payer: Self-pay | Admitting: Family Medicine

## 2024-04-07 ENCOUNTER — Encounter (HOSPITAL_COMMUNITY): Payer: Self-pay

## 2024-04-07 ENCOUNTER — Ambulatory Visit (HOSPITAL_COMMUNITY)
Admission: RE | Admit: 2024-04-07 | Discharge: 2024-04-07 | Disposition: A | Discharge status: Home / Self Care | Source: Ambulatory Visit | Attending: Family Medicine | Admitting: Family Medicine

## 2024-04-07 VITALS — BP 132/86 | HR 71 | Ht 68.0 in | Wt 202.2 lb

## 2024-04-07 DIAGNOSIS — I5022 Chronic systolic (congestive) heart failure: Secondary | ICD-10-CM | POA: Diagnosis not present

## 2024-04-07 DIAGNOSIS — E875 Hyperkalemia: Secondary | ICD-10-CM | POA: Insufficient documentation

## 2024-04-07 DIAGNOSIS — F17290 Nicotine dependence, other tobacco product, uncomplicated: Secondary | ICD-10-CM | POA: Diagnosis not present

## 2024-04-07 DIAGNOSIS — I5042 Chronic combined systolic (congestive) and diastolic (congestive) heart failure: Secondary | ICD-10-CM

## 2024-04-07 DIAGNOSIS — N183 Chronic kidney disease, stage 3 unspecified: Secondary | ICD-10-CM

## 2024-04-07 DIAGNOSIS — E1122 Type 2 diabetes mellitus with diabetic chronic kidney disease: Secondary | ICD-10-CM | POA: Diagnosis not present

## 2024-04-07 DIAGNOSIS — I428 Other cardiomyopathies: Secondary | ICD-10-CM | POA: Insufficient documentation

## 2024-04-07 DIAGNOSIS — Z7984 Long term (current) use of oral hypoglycemic drugs: Secondary | ICD-10-CM | POA: Diagnosis not present

## 2024-04-07 DIAGNOSIS — I130 Hypertensive heart and chronic kidney disease with heart failure and stage 1 through stage 4 chronic kidney disease, or unspecified chronic kidney disease: Secondary | ICD-10-CM | POA: Insufficient documentation

## 2024-04-07 DIAGNOSIS — I480 Paroxysmal atrial fibrillation: Secondary | ICD-10-CM

## 2024-04-07 DIAGNOSIS — Z7901 Long term (current) use of anticoagulants: Secondary | ICD-10-CM | POA: Diagnosis not present

## 2024-04-07 DIAGNOSIS — Z79899 Other long term (current) drug therapy: Secondary | ICD-10-CM | POA: Diagnosis not present

## 2024-04-07 DIAGNOSIS — I493 Ventricular premature depolarization: Secondary | ICD-10-CM

## 2024-04-07 LAB — CBC
HCT: 47.1 % (ref 39.0–52.0)
Hemoglobin: 16.1 g/dL (ref 13.0–17.0)
MCH: 33.3 pg (ref 26.0–34.0)
MCHC: 34.2 g/dL (ref 30.0–36.0)
MCV: 97.5 fL (ref 80.0–100.0)
Platelets: 212 K/uL (ref 150–400)
RBC: 4.83 MIL/uL (ref 4.22–5.81)
RDW: 12.2 % (ref 11.5–15.5)
WBC: 7.3 K/uL (ref 4.0–10.5)
nRBC: 0 % (ref 0.0–0.2)

## 2024-04-07 LAB — BASIC METABOLIC PANEL WITH GFR
Anion gap: 8 (ref 5–15)
BUN: 17 mg/dL (ref 8–23)
CO2: 28 mmol/L (ref 22–32)
Calcium: 9.5 mg/dL (ref 8.9–10.3)
Chloride: 101 mmol/L (ref 98–111)
Creatinine, Ser: 1.13 mg/dL (ref 0.61–1.24)
GFR, Estimated: >60 mL/min (ref >60)
Glucose, Bld: 147 mg/dL — ABNORMAL HIGH (ref 70–99)
Potassium: 4.2 mmol/L (ref 3.5–5.1)
Sodium: 137 mmol/L (ref 135–145)

## 2024-04-07 NOTE — Patient Instructions (Addendum)
 Good to see you today!   No medication changes were made  Labs done today, your results will be available in MyChart, we will contact you for abnormal readings.  Your physician recommends that you schedule a follow-up appointment 6 months (April) Call office in February to schedule an appointment  If you have any questions or concerns before your next appointment please send us  a message through [place] or call our office at [phone].    TO LEAVE A MESSAGE FOR THE NURSE SELECT OPTION 2, PLEASE LEAVE A MESSAGE INCLUDING: YOUR NAME DATE OF BIRTH CALL BACK NUMBER REASON FOR CALL**this is important as we prioritize the call backs  YOU WILL RECEIVE A CALL BACK THE SAME DAY AS LONG AS YOU CALL BEFORE 4:00 PM At the Advanced [facility] Clinic, you and your health needs are our priority. As part of our continuing mission to provide you with exceptional heart care, we have created designated Provider Care Teams. These Care Teams include your primary Cardiologist (physician) and Advanced Practice Providers (APPs- Physician Assistants and Nurse Practitioners) who all work together to provide you with the care you need, when you need it.   You may see any of the following providers on your designated Care Team at your next follow up: Dr Toribio Fuel Dr Ezra Shuck Dr. Ria Commander Dr. Morene Brownie Amy Lenetta, NP Caffie Shed, [place] [facility] [place], [place] Beckey Coe, NP [place] Lee, NP Ellouise Class, NP Tinnie Redman, PharmD Jaun Bash, PharmD   Please be sure to bring in all your medications bottles to every appointment.    Thank you for choosing [facility]

## 2024-05-15 ENCOUNTER — Other Ambulatory Visit: Payer: Self-pay

## 2024-05-15 DIAGNOSIS — R310 Gross hematuria: Secondary | ICD-10-CM

## 2024-05-15 DIAGNOSIS — N401 Enlarged prostate with lower urinary tract symptoms: Secondary | ICD-10-CM

## 2024-05-15 MED ORDER — FINASTERIDE 5 MG PO TABS: 5.0000 mg | ORAL_TABLET | Freq: Every day | ORAL | 11 refills | Status: AC

## 2024-10-17 ENCOUNTER — Ambulatory Visit: Admitting: Urology
